# Patient Record
Sex: Female | Born: 1950 | Race: White | Hispanic: No | Marital: Married | Smoking: Former smoker
Health system: Southern US, Community
[De-identification: ages and names within clinical notes are randomized; demographics above are authoritative.]

## PROBLEM LIST (undated history)

## (undated) DIAGNOSIS — I493 Ventricular premature depolarization: Secondary | ICD-10-CM

## (undated) DIAGNOSIS — Q2119 Other specified atrial septal defect: Secondary | ICD-10-CM

## (undated) DIAGNOSIS — F329 Major depressive disorder, single episode, unspecified: Secondary | ICD-10-CM

## (undated) DIAGNOSIS — K5790 Diverticulosis of intestine, part unspecified, without perforation or abscess without bleeding: Secondary | ICD-10-CM

## (undated) DIAGNOSIS — I420 Dilated cardiomyopathy: Secondary | ICD-10-CM

## (undated) DIAGNOSIS — I4892 Unspecified atrial flutter: Secondary | ICD-10-CM

## (undated) DIAGNOSIS — E785 Hyperlipidemia, unspecified: Secondary | ICD-10-CM

## (undated) DIAGNOSIS — R0609 Other forms of dyspnea: Secondary | ICD-10-CM

## (undated) DIAGNOSIS — G4730 Sleep apnea, unspecified: Secondary | ICD-10-CM

## (undated) DIAGNOSIS — H9192 Unspecified hearing loss, left ear: Secondary | ICD-10-CM

## (undated) DIAGNOSIS — Z950 Presence of cardiac pacemaker: Secondary | ICD-10-CM

## (undated) DIAGNOSIS — R001 Bradycardia, unspecified: Secondary | ICD-10-CM

## (undated) DIAGNOSIS — K219 Gastro-esophageal reflux disease without esophagitis: Secondary | ICD-10-CM

## (undated) DIAGNOSIS — I471 Supraventricular tachycardia, unspecified: Secondary | ICD-10-CM

## (undated) DIAGNOSIS — I071 Rheumatic tricuspid insufficiency: Secondary | ICD-10-CM

## (undated) DIAGNOSIS — I10 Essential (primary) hypertension: Secondary | ICD-10-CM

## (undated) DIAGNOSIS — I4719 Other supraventricular tachycardia: Secondary | ICD-10-CM

## (undated) DIAGNOSIS — I5181 Takotsubo syndrome: Secondary | ICD-10-CM

## (undated) HISTORY — PX: ANKLE ARTHROSCOPY WITH REPAIR SUBLUXING TENDON: SHX5584

## (undated) HISTORY — PX: PARTIAL HYSTERECTOMY: SHX80

## (undated) HISTORY — PX: CARDIAC CATHETERIZATION: SHX172

## (undated) HISTORY — PX: REPLACEMENT TOTAL KNEE: SUR1224

## (undated) HISTORY — PX: TOTAL HIP ARTHROPLASTY: SHX124

---

## 2006-02-09 ENCOUNTER — Ambulatory Visit: Payer: Self-pay | Admitting: Internal Medicine

## 2006-12-23 ENCOUNTER — Emergency Department: Payer: Self-pay | Admitting: Unknown Physician Specialty

## 2007-05-22 ENCOUNTER — Emergency Department: Payer: Self-pay | Admitting: Emergency Medicine

## 2007-12-03 ENCOUNTER — Emergency Department: Payer: Self-pay | Admitting: Emergency Medicine

## 2009-06-30 ENCOUNTER — Inpatient Hospital Stay (HOSPITAL_COMMUNITY): Admission: RE | Admit: 2009-06-30 | Discharge: 2009-07-02 | Payer: Self-pay | Admitting: Orthopedic Surgery

## 2009-08-01 ENCOUNTER — Ambulatory Visit: Admission: RE | Admit: 2009-08-01 | Discharge: 2009-08-01 | Payer: Self-pay | Admitting: Orthopedic Surgery

## 2009-08-01 ENCOUNTER — Encounter (INDEPENDENT_AMBULATORY_CARE_PROVIDER_SITE_OTHER): Payer: Self-pay | Admitting: Orthopedic Surgery

## 2009-08-01 ENCOUNTER — Ambulatory Visit: Payer: Self-pay | Admitting: Vascular Surgery

## 2009-08-04 ENCOUNTER — Ambulatory Visit (HOSPITAL_COMMUNITY): Admission: RE | Admit: 2009-08-04 | Discharge: 2009-08-04 | Payer: Self-pay | Admitting: Orthopedic Surgery

## 2009-08-16 DIAGNOSIS — K5732 Diverticulitis of large intestine without perforation or abscess without bleeding: Secondary | ICD-10-CM | POA: Insufficient documentation

## 2009-10-20 ENCOUNTER — Emergency Department: Payer: Self-pay | Admitting: Emergency Medicine

## 2010-04-16 ENCOUNTER — Ambulatory Visit: Payer: Self-pay | Admitting: Unknown Physician Specialty

## 2010-04-21 LAB — PATHOLOGY REPORT

## 2010-09-06 ENCOUNTER — Encounter: Payer: Self-pay | Admitting: Orthopedic Surgery

## 2010-09-07 ENCOUNTER — Encounter: Payer: Self-pay | Admitting: Orthopedic Surgery

## 2010-10-14 ENCOUNTER — Ambulatory Visit: Payer: Self-pay

## 2010-11-18 LAB — BASIC METABOLIC PANEL
BUN: 6 mg/dL (ref 6–23)
CO2: 27 mEq/L (ref 19–32)
CO2: 29 mEq/L (ref 19–32)
Calcium: 8.4 mg/dL (ref 8.4–10.5)
Chloride: 101 mEq/L (ref 96–112)
GFR calc non Af Amer: >60 mL/min (ref >60)
Glucose, Bld: 103 mg/dL — ABNORMAL HIGH (ref 70–99)
Glucose, Bld: 129 mg/dL — ABNORMAL HIGH (ref 70–99)
Potassium: 4.3 mEq/L (ref 3.5–5.1)
Sodium: 135 mEq/L (ref 135–145)
Sodium: 138 mEq/L (ref 135–145)

## 2010-11-18 LAB — URINALYSIS, ROUTINE W REFLEX MICROSCOPIC
Glucose, UA: NEGATIVE mg/dL
Leukocytes, UA: NEGATIVE
Protein, ur: NEGATIVE mg/dL
Specific Gravity, Urine: 1.023 (ref 1.005–1.030)
pH: 5.5 (ref 5.0–8.0)

## 2010-11-18 LAB — COMPREHENSIVE METABOLIC PANEL
ALT: 20 U/L (ref 0–35)
AST: 16 U/L (ref 0–37)
Alkaline Phosphatase: 60 U/L (ref 39–117)
CO2: 26 mEq/L (ref 19–32)
Calcium: 9.1 mg/dL (ref 8.4–10.5)
Potassium: 4.3 mEq/L (ref 3.5–5.1)
Sodium: 138 mEq/L (ref 135–145)
Total Protein: 6.6 g/dL (ref 6.0–8.3)

## 2010-11-18 LAB — CBC
HCT: 33.5 % — ABNORMAL LOW (ref 36.0–46.0)
Hemoglobin: 11.5 g/dL — ABNORMAL LOW (ref 12.0–15.0)
MCHC: 34.5 g/dL (ref 30.0–36.0)
MCHC: 34.8 g/dL (ref 30.0–36.0)
MCHC: 34.7 g/dL (ref 30.0–36.0)
MCV: 89.9 fL (ref 78.0–100.0)
MCV: 90.7 fL (ref 78.0–100.0)
MCV: 89.3 fL (ref 78.0–100.0)
Platelets: 227 10*3/uL (ref 150–400)
RBC: 3.23 MIL/uL — ABNORMAL LOW (ref 3.87–5.11)
RBC: 3.72 MIL/uL — ABNORMAL LOW (ref 3.87–5.11)
RBC: 4.45 MIL/uL (ref 3.87–5.11)
RDW: 13.5 % (ref 11.5–15.5)
WBC: 10 10*3/uL (ref 4.0–10.5)
WBC: 7.9 10*3/uL (ref 4.0–10.5)

## 2010-11-18 LAB — CROSSMATCH: Antibody Screen: NEGATIVE

## 2010-11-18 LAB — DIFFERENTIAL
Eosinophils Absolute: 0.2 10*3/uL (ref 0.0–0.7)
Eosinophils Relative: 2 % (ref 0–5)
Lymphocytes Relative: 33 % (ref 12–46)
Lymphs Abs: 2.6 10*3/uL (ref 0.7–4.0)
Monocytes Absolute: 0.5 10*3/uL (ref 0.1–1.0)
Monocytes Relative: 7 % (ref 3–12)

## 2010-11-18 LAB — URINE MICROSCOPIC-ADD ON

## 2010-11-18 LAB — URINE CULTURE

## 2010-11-18 LAB — ABO/RH: ABO/RH(D): O POS

## 2012-03-09 ENCOUNTER — Emergency Department: Payer: Self-pay | Admitting: Emergency Medicine

## 2012-03-09 LAB — COMPREHENSIVE METABOLIC PANEL
Albumin: 4 g/dL (ref 3.4–5.0)
Alkaline Phosphatase: 73 U/L (ref 50–136)
BUN: 17 mg/dL (ref 7–18)
Bilirubin,Total: 0.6 mg/dL (ref 0.2–1.0)
Creatinine: 0.83 mg/dL (ref 0.60–1.30)
Glucose: 111 mg/dL — ABNORMAL HIGH (ref 65–99)
Osmolality: 276 (ref 275–301)
Sodium: 137 mmol/L (ref 136–145)

## 2012-03-09 LAB — CBC WITH DIFFERENTIAL/PLATELET
Basophil #: 0.1 10*3/uL (ref 0.0–0.1)
Eosinophil #: 0.1 10*3/uL (ref 0.0–0.7)
Lymphocyte %: 16.4 %
MCHC: 34.2 g/dL (ref 32.0–36.0)
Monocyte %: 8.8 %
Neutrophil #: 4.7 10*3/uL (ref 1.4–6.5)
Platelet: 202 10*3/uL (ref 150–440)
RBC: 4.84 10*6/uL (ref 3.80–5.20)
RDW: 13.7 % (ref 11.5–14.5)
WBC: 6.5 10*3/uL (ref 3.6–11.0)

## 2012-03-09 LAB — CK TOTAL AND CKMB (NOT AT ARMC)
CK, Total: 58 U/L (ref 21–215)
CK-MB: 1 ng/mL (ref 0.5–3.6)

## 2012-05-23 ENCOUNTER — Ambulatory Visit: Payer: Self-pay | Admitting: Internal Medicine

## 2012-06-09 ENCOUNTER — Ambulatory Visit: Payer: Self-pay | Admitting: Internal Medicine

## 2012-06-27 ENCOUNTER — Ambulatory Visit: Payer: Self-pay | Admitting: Internal Medicine

## 2014-06-18 ENCOUNTER — Encounter: Payer: Self-pay | Admitting: Internal Medicine

## 2014-07-16 ENCOUNTER — Encounter: Payer: Self-pay | Admitting: Internal Medicine

## 2015-01-29 ENCOUNTER — Encounter: Payer: Self-pay | Admitting: General Practice

## 2015-01-29 ENCOUNTER — Emergency Department
Admission: EM | Admit: 2015-01-29 | Discharge: 2015-01-29 | Disposition: A | Discharge status: Home / Self Care | Payer: Medicare Other | Attending: Emergency Medicine | Admitting: Emergency Medicine

## 2015-01-29 ENCOUNTER — Emergency Department: Payer: Medicare Other

## 2015-01-29 ENCOUNTER — Other Ambulatory Visit: Payer: Self-pay

## 2015-01-29 DIAGNOSIS — Z87891 Personal history of nicotine dependence: Secondary | ICD-10-CM | POA: Diagnosis not present

## 2015-01-29 DIAGNOSIS — Z96651 Presence of right artificial knee joint: Secondary | ICD-10-CM | POA: Insufficient documentation

## 2015-01-29 DIAGNOSIS — Y832 Surgical operation with anastomosis, bypass or graft as the cause of abnormal reaction of the patient, or of later complication, without mention of misadventure at the time of the procedure: Secondary | ICD-10-CM | POA: Insufficient documentation

## 2015-01-29 DIAGNOSIS — R0600 Dyspnea, unspecified: Secondary | ICD-10-CM

## 2015-01-29 DIAGNOSIS — S8012XA Contusion of left lower leg, initial encounter: Secondary | ICD-10-CM

## 2015-01-29 DIAGNOSIS — I10 Essential (primary) hypertension: Secondary | ICD-10-CM | POA: Diagnosis not present

## 2015-01-29 DIAGNOSIS — M96830 Postprocedural hemorrhage and hematoma of a musculoskeletal structure following a musculoskeletal system procedure: Secondary | ICD-10-CM | POA: Diagnosis not present

## 2015-01-29 DIAGNOSIS — R609 Edema, unspecified: Secondary | ICD-10-CM

## 2015-01-29 HISTORY — DX: Gastro-esophageal reflux disease without esophagitis: K21.9

## 2015-01-29 HISTORY — DX: Essential (primary) hypertension: I10

## 2015-01-29 HISTORY — DX: Sleep apnea, unspecified: G47.30

## 2015-01-29 LAB — CBC
HCT: 35.7 % (ref 35.0–47.0)
Hemoglobin: 12.1 g/dL (ref 12.0–16.0)
MCH: 30.3 pg (ref 26.0–34.0)
MCHC: 33.8 g/dL (ref 32.0–36.0)
MCV: 89.6 fL (ref 80.0–100.0)
Platelets: 298 10*3/uL (ref 150–440)
RBC: 3.98 MIL/uL (ref 3.80–5.20)
RDW: 13.2 % (ref 11.5–14.5)
WBC: 10.2 10*3/uL (ref 3.6–11.0)

## 2015-01-29 LAB — COMPREHENSIVE METABOLIC PANEL
ALBUMIN: 3.8 g/dL (ref 3.5–5.0)
ALT: 54 U/L (ref 14–54)
ANION GAP: 12 (ref 5–15)
AST: 41 U/L (ref 15–41)
Alkaline Phosphatase: 114 U/L (ref 38–126)
BILIRUBIN TOTAL: 1 mg/dL (ref 0.3–1.2)
BUN: 21 mg/dL — ABNORMAL HIGH (ref 6–20)
CALCIUM: 8.7 mg/dL — AB (ref 8.9–10.3)
CHLORIDE: 101 mmol/L (ref 101–111)
CO2: 24 mmol/L (ref 22–32)
Creatinine, Ser: 0.84 mg/dL (ref 0.44–1.00)
Glucose, Bld: 143 mg/dL — ABNORMAL HIGH (ref 65–99)
Potassium: 3.5 mmol/L (ref 3.5–5.1)
SODIUM: 137 mmol/L (ref 135–145)
Total Protein: 7.6 g/dL (ref 6.5–8.1)

## 2015-01-29 LAB — TROPONIN I: Troponin I: <0.03 ng/mL (ref <0.031)

## 2015-01-29 LAB — APTT: aPTT: 30 seconds (ref 24–36)

## 2015-01-29 LAB — PROTIME-INR
INR: 1.12
PROTHROMBIN TIME: 14.6 s (ref 11.4–15.0)

## 2015-01-29 MED ORDER — LORAZEPAM 2 MG/ML IJ SOLN
INTRAMUSCULAR | Status: AC
  Filled 2015-01-29: qty 1

## 2015-01-29 MED ORDER — IOHEXOL 350 MG/ML SOLN
100.0000 mL | Freq: Once | INTRAVENOUS | Status: AC | PRN
  Administered 2015-01-29: 100 mL via INTRAVENOUS

## 2015-01-29 MED ORDER — ALPRAZOLAM 0.5 MG PO TABS
0.5000 mg | ORAL_TABLET | Freq: Once | ORAL | Status: AC
  Administered 2015-01-29: 0.5 mg via ORAL

## 2015-01-29 MED ORDER — ALPRAZOLAM 0.5 MG PO TABS
ORAL_TABLET | ORAL | Status: AC
  Administered 2015-01-29: 0.5 mg via ORAL
  Filled 2015-01-29: qty 1

## 2015-01-29 MED ORDER — LORAZEPAM 2 MG/ML IJ SOLN
1.0000 mg | Freq: Once | INTRAMUSCULAR | Status: AC
  Administered 2015-01-29: 1 mg via INTRAVENOUS

## 2015-01-29 NOTE — ED Notes (Signed)
Patient transported to radiology

## 2015-01-29 NOTE — ED Notes (Signed)
Pt. Arrived to ed from home with reports of increase swelling and bruising to LLE. Pt reports having a left knee replacement on Friday. Pt reprots increase swelling and pain to leg over the last two days. Discoloration noted to LLE. Pt alert and oriented. Sent over for evaluation of possible blood clot, per pt.

## 2015-01-29 NOTE — ED Provider Notes (Signed)
[facility] Emergency Department Provider Note ____________________________________________  Time seen: Approximately 5:17 PM  I have reviewed the triage vital signs and the nursing notes.   HISTORY  Chief Complaint Leg Pain and Leg Swelling  HPI Solay Stadtmiller is a 64 y.o. female who is 5 days status post left total knee replacement by Dr. Ananias Pilgrim at [facility] and presents to the emergency department due to left lower extremity pain, swelling, bruising that has been progressing and shortness of breath that began to worsen last night. Patient was started on a Eliquis 2.5 mg twice a day the night of her surgery. She has never had a blood clot in the past. She denies any chest pain, fever, chills, recent illness. Shortness of breath is moderate and is worse when patient is supine or has her legs elevated.  She has had a total knee replacement on the right in 2010 that was revised with a second TKR resulting in limited flexion of her knee requiring her to walk with a cane at baseline. Since having the left TKR, patient uses a walker to transfer and states mobility is difficult.   Past Medical History  Diagnosis Date  . GERD (gastroesophageal reflux disease)   . Hypertension   . Sleep apnea     There are no active problems to display for this patient.   Past Surgical History  Procedure Laterality Date  . Replacement total knee      No current outpatient prescriptions on file.  Allergies Sulfa antibiotics  No family history on file.  Social History History  Substance Use Topics  . Smoking status: Former Games developer  . Smokeless tobacco: Not on file  . Alcohol Use: No    Review of Systems Constitutional: No fever/chills Eyes: No visual changes. ENT: No URI Cardiovascular: Denies chest pain. Respiratory: See History of present illness. Gastrointestinal: No abdominal pain.  No nausea, no vomiting.  No diarrhea.  Musculoskeletal: Negative for back  pain. Skin: Negative for rash. Neurological: Negative for headaches, focal weakness or numbness. Psychiatric:Normal mood Endocrine:No recent weight change 10-point ROS otherwise negative.  ____________________________________________   PHYSICAL EXAM:  VITAL SIGNS: ED Triage Vitals  Enc Vitals Group     BP 01/29/15 1624 159/67 mmHg     Pulse Rate 01/29/15 1624 98     Resp 01/29/15 1700 16     Temp 01/29/15 1624 97.7 F (36.5 C)     Temp Source 01/29/15 1624 Oral     SpO2 01/29/15 1624 95 %     Weight 01/29/15 1624 200 lb (90.719 kg)     Height 01/29/15 1624 5\' 5"  (1.651 m)     Head Cir --      Peak Flow --      Pain Score 01/29/15 1624 7     Pain Loc --      Pain Edu? --      Excl. in GC? --    Constitutional: Alert and oriented. Well appearing; tachypneic Eyes: Conjunctivae are normal. PERRL. EOMI. Head: Atraumatic. Nose: No congestion/rhinnorhea. Mouth/Throat: Mucous membranes are moist.  Oropharynx non-erythematous. Neck: No stridor.   Lymphatic: No cervical lymphadenopathy. Cardiovascular: Normal rate, regular rhythm. Grossly normal heart sounds.  Peripheral pulses 2+ B d.p. & p.t. Respiratory: mild tachypnea, worse with talking.  No retractions. Lungs CTAB. Gastrointestinal: Soft and nontender. No distention. Normal bowel sounds.  Musculoskeletal: Left lower extremity with a clean, dry, intact incision with staples over her anterior knee, ecchymosis over calf, inner thigh, lateral  anterior ankle, 1-2+ pitting edema, pain to palpation of her calf and medial thigh Neurologic:  Normal speech and language. No gross focal neurologic deficits are appreciated. Speech is normal.  Skin:  Skin is warm, dry and intact. No rash noted aside from ecchymosis of her left leg. Psychiatric: Mood and affect are normal. Speech and behavior are normal. She became somewhat anxious when discussing need for CT scan  ____________________________________________   LABS (all labs ordered  are listed, but only abnormal results are displayed)  Labs Reviewed  COMPREHENSIVE METABOLIC PANEL  CBC  TROPONIN I  PROTIME-INR  APTT   ____________________________________________  EKG   Date: 01/29/2015  Rate: 93  Rhythm: normal sinus rhythm with 1 PVC  QRS Axis: normal  Intervals: normal  ST/T Wave abnormalities: normal  Conduction Disutrbances: none  Narrative Interpretation: unremarkable  ____________________________________________  RADIOLOGY  CTA chest-NAD LLE DVT-NAD ____________________________________________   PROCEDURES  Procedure(s) performed: none  Critical Care performed: none ____________________________________________   INITIAL IMPRESSION / ASSESSMENT AND PLAN / ED COURSE  Pertinent labs & imaging results that were available during my care of the patient were reviewed by me and considered in my medical decision making (see chart for details).  Symptoms concerning for DVT and PE. We will evaluate with CTA chest and lower extremity Doppler.  Patient is very anxious about undergoing the CT as she is "extremely claustrophobic". She has taken Xanax in the past but didn't think that it helped. When given Xanax here she did not feel that it was sufficient and requested something IV so I gave her Ativan.  ----------------------------------------- 8:00 PM on 01/29/2015 -----------------------------------------  Patient no longer dyspneic. She feels well and wants to go home. Calf compartment is soft. Patient and spouse advised warning symptoms for compartment syndrome. Also advised to call orthopedics in the morning for possible change in blood thinner prescription. ____________________________________________   FINAL CLINICAL IMPRESSION(S) / ED DIAGNOSES Dyspnea, post-op contusion on Eliquis     Maurilio Lovely, MD 01/29/15 2009

## 2015-01-29 NOTE — ED Notes (Signed)
Per Dr. Rolla Plate she states "patient can take her own medications.  Metoclopramide 5mg  1 tab q6h prn for nausea and oxycodone 5mg  1-2 tab PO q3-4h prn for post op pain.

## 2015-01-29 NOTE — ED Notes (Signed)
Patient reports being unable to proceed with CT scan without IV anxiety medication. Medication given while patient en route to CT.

## 2015-01-29 NOTE — Discharge Instructions (Signed)
Monitor your calf for increased swelling, tightness, numbness or coolness of the foot, increased pain as these could be signs of compartment syndrome. Also return to the emergency department if you develop chest pain, fever or for any other concerns. Talk to your orthopedist tomorrow to find out if they would like you to switch blood thinners.

## 2016-12-28 DIAGNOSIS — F458 Other somatoform disorders: Secondary | ICD-10-CM | POA: Insufficient documentation

## 2018-04-07 DIAGNOSIS — T84012A Broken internal right knee prosthesis, initial encounter: Secondary | ICD-10-CM | POA: Insufficient documentation

## 2018-08-31 ENCOUNTER — Ambulatory Visit: Payer: Medicare Other | Attending: Orthopedic Surgery | Admitting: Physical Therapy

## 2018-08-31 DIAGNOSIS — R269 Unspecified abnormalities of gait and mobility: Secondary | ICD-10-CM

## 2018-08-31 DIAGNOSIS — M6281 Muscle weakness (generalized): Secondary | ICD-10-CM | POA: Insufficient documentation

## 2018-08-31 DIAGNOSIS — M25661 Stiffness of right knee, not elsewhere classified: Secondary | ICD-10-CM

## 2018-08-31 DIAGNOSIS — Z7409 Other reduced mobility: Secondary | ICD-10-CM | POA: Diagnosis present

## 2018-08-31 NOTE — Therapy (Signed)
[facility] [address] [place], [place], [address] Phone: [phone]   Fax:  [phone]  Physical Therapy Evaluation  Patient Details  Name: Joy Vaughan MRN: [id number] Date of Birth: 11/29/1950 Referring Provider (PT): Dr. Silas Flood   Encounter Date: 08/31/2018  PT End of Session - 09/01/18 1040    Visit Number  1    Number of Visits  8    Date for PT Re-Evaluation  09/28/18    PT Start Time  0901    PT Stop Time  1013    PT Time Calculation (min)  72 min    Equipment Utilized During Treatment  Other (comment)   RW   Activity Tolerance  Patient tolerated treatment well    Behavior During Therapy  [facility] for tasks assessed/performed       Past Medical History:  Diagnosis Date  . GERD (gastroesophageal reflux disease)   . Hypertension   . Sleep apnea     Past Surgical History:  Procedure Laterality Date  . REPLACEMENT TOTAL KNEE      There were no vitals filed for this visit.   Subjective Assessment - 08/31/18 0906    Subjective  Pt. ambulates into clinic with stiff antalgic gait and use of RW. Pt. reports having R total knee revison on (04/07/18). See hx for TKA complications and previous PT. Pt. reports being able to drive independently and ambulate stairs safely as well. Pt. reports that she either uses a RW or SPC. Pt. reports tightness along side of thighs that gets worse at night. Pt. reports 0/10 NPS currently and 2/10 pain in joint at worst in the past 7 days. Pt. reports using topical cream with magnesium at night to try to relieve tightness. Pt. enjoys walking around the grocery store with a cart for fun.     Pertinent History  Pt. reports having extensive surgery hx: R ankle tendon repair (2009), R knee arthroscope (2010), R TKA November 2010, R TKA October 2011, R THA 10/04/17. Right TKA revision 04/07/18. Pt. has also had L TKA (June 2016) and L THA (June 2018). Pt. reports that she has had 3 knee replacement  surgeries on the R (2010, 2011, 2019). Pt. reports having her most recent knee surgery (R total knee revison) on (04/07/18) by Dr. Silas Flood. In 2011, the MD (Lang) cemented the components in the tibia, femur, and patella. In her most recent revision, the MD (Dr. Silas Flood) "chiseled behind her patella." She then went to PT at [facility] right after the revision before her appointment with [place] where pt. reports she achieved 40 degreees of knee flexion.     Limitations  Walking;House hold activities;Standing    Diagnostic tests  see imaging     Patient Stated Goals  strengthening LEs and start exercise habit/routine and weight loss     Currently in Pain?  No/denies        Palpation: no mobility noted in patella  ROM: Flex: 113 deg. L, 26 deg. R Ext: -4 deg. L, -1 deg. R  MMT strength: Hip flex 4/5 left, 3+/5 right Hip Abd: 5/5 left, 4/5 right Hip Add: 4+/5 left, 3+ right  Hip IR/ER, 5/5 left, IR/ER 4/5 right  Knee extension 5/5 left, 4/5 with pain right Knee flexion 5/5 left, 5/5 right  DF: 5/5 left, 5/5 right PF: 5/5 left, 5/5 right   Inversion 5/5 left, 4/5 right Eversion 5/5 left, 5/5 right  Objective measurements completed on  examination: See above findings.    Tandem stance in //-bars; 30 sec balance with left leg forward. 35 sec with right leg forward       Reviewed pts. Current HEP  PT Long Term Goals - 08/31/18 1516      PT LONG TERM GOAL #1   Title  Pt. will increase FOTO to 54 to increase pain-free mobility.    Baseline  Initial FOTO: 45    Time  4    Period  Weeks    Status  New    Target Date  09/28/18      PT LONG TERM GOAL #2   Title  Pt. independent with HEP to increase B LE strength by 1/2 grade to improve activity tolerance/ pain-free household mobility.       Baseline  Pt. presents with strength deficits on R (hip flex: 3+/5, hip abd: 4/5, hip add: 3+/5, knee ext: 4/5 with pain). MMT on L is grossly 5/5.     Time  4    Period   Weeks    Status  New    Target Date  09/28/18      PT LONG TERM GOAL #3   Title  Pt. will be able to complete 30 minutes of therapeutic/ aquatic exercise with no increase c/o pain/ limitations to improve functional mobility.      Baseline  pt. is not participating with gym based ex. at this time.  Pt. has a HEP from previous PT clinic.      Time  4    Period  Weeks    Status  New    Target Date  09/28/18      PT LONG TERM GOAL #4   Title  Pt. will progress to use of SPC with consistent gait pattern on all surfaces to promote greater independence in community/ with grandkids.      Baseline  pt. using RW to ambulate outside and occasionally SPC at home.     Time  4    Period  Weeks    Status  New    Target Date  09/28/18         Plan - 08/31/18 [id number]    Clinical Impression Statement  Pt. is a pleasant 68 y/o female with R knee TKA complication/ significant knee joint stiffness.  Pt. reports 0/10 NPS and her worst pain 2/10 NPS. Pt. demonstrates significant R knee flex stiffness with ambulating and use of RW. Pt. has slight forward lean in standing but is able to weight shift equally. Pt. has excellent HS flexibility and significant knee flexion deficits on R (26 deg. R, 113 deg. L); extension (-1 R, -4 L). Pt. presents with strength deficits on R (hip flex: 3+/5, hip abd: 4/5, hip add: 3+/5, knee ext: 4/5 with pain). MMT on L is grossly 5/5. Pt. demonstrated L jt. line circumference of 44.5 cm on left and 47.5 cm on right. L and R mid calf circumference of 40.5 cm. L quad circumference of 52.5 cm and 56.5 for R quad.  Pt. demonstrates step to gait with ascending/descending stairs, slight circumduction of R LE and min UE assist. Pt. will benefit from skilled PT services in order to increase strength and improve pain during ADLs/ overall mobility.     Clinical Presentation  Stable    Clinical Decision Making  Moderate    Rehab Potential  Fair    PT Frequency  2x / week    PT Duration  4  weeks    PT Treatment/Interventions  Aquatic Therapy;Therapeutic activities;Therapeutic exercise;Balance training;Scar mobilization;Passive range of motion;Manual techniques;Neuromuscular re-education;Gait training;Stair training;ADLs/Self Care Home Management;Electrical Stimulation;Moist Heat;Cryotherapy;Functional mobility training;Patient/family education    PT Next Visit Plan  Standing strengthening ex. program.  Pt. has 2 PT visits in clinic prior to starting aquatic ex.      PT Home Exercise Plan  pt. completing prior HEP.        Patient will benefit from skilled therapeutic intervention in order to improve the following deficits and impairments:  Pain, Decreased mobility, Decreased range of motion, Decreased strength, Hypomobility, Difficulty walking, Decreased balance, Decreased coordination, Abnormal gait, Decreased endurance, Decreased activity tolerance, Impaired flexibility  Visit Diagnosis: Joint stiffness of knee, right  Muscle weakness (generalized)  Gait difficulty  Decreased mobility and endurance     Problem List There are no active problems to display for this patient.  Cammie Mcgee, PT, DPT # [address] [place], SPT  [place], [place] 09/01/2018, 12:25 PM  [facility] [facility] [address] [place], [place], [address] Phone: [phone]   Fax:  [phone]  Name: Zendayah Elenes MRN: [id number] Date of Birth: August 22, 1950

## 2018-09-01 ENCOUNTER — Encounter: Payer: Self-pay | Admitting: Physical Therapy

## 2018-09-05 ENCOUNTER — Encounter: Payer: Self-pay | Admitting: Physical Therapy

## 2018-09-05 ENCOUNTER — Ambulatory Visit: Payer: Medicare Other | Admitting: Physical Therapy

## 2018-09-05 DIAGNOSIS — M25661 Stiffness of right knee, not elsewhere classified: Secondary | ICD-10-CM | POA: Diagnosis not present

## 2018-09-05 DIAGNOSIS — M6281 Muscle weakness (generalized): Secondary | ICD-10-CM

## 2018-09-05 DIAGNOSIS — Z7409 Other reduced mobility: Secondary | ICD-10-CM

## 2018-09-05 DIAGNOSIS — R269 Unspecified abnormalities of gait and mobility: Secondary | ICD-10-CM

## 2018-09-05 NOTE — Therapy (Signed)
[facility] [address] [place], [place], [address] Phone: [phone]   Fax:  [phone]  Physical Therapy Treatment  Patient Details  Name: Joy Vaughan MRN: [id number] Date of Birth: [birth year] Referring Provider (PT): Dr. Silas Flood   Encounter Date: 09/05/2018  PT End of Session - 09/05/18 0855    Visit Number  2    Number of Visits  8    Date for PT Re-Evaluation  09/28/18    PT Start Time  0848    PT Stop Time  0941    PT Time Calculation (min)  53 min    Equipment Utilized During Treatment  Other (comment)    Activity Tolerance  Patient tolerated treatment well    Behavior During Therapy  [facility] for tasks assessed/performed       Past Medical History:  Diagnosis Date  . GERD (gastroesophageal reflux disease)   . Hypertension   . Sleep apnea     Past Surgical History:  Procedure Laterality Date  . REPLACEMENT TOTAL KNEE      There were no vitals filed for this visit.  Subjective Assessment - 09/05/18 0853    Subjective  Pt. reports no increase in sx since initial eval last week. Pt. reports pain with flexion but no pain at rest. Pt. states that she starts aquatic therapy next week.    Pertinent History  Pt. reports having extensive surgery hx: R ankle tendon repair (2009), R knee arthroscope (2010), R TKA November 2010, R TKA October 2011, R THA 10/04/17. Right TKA revision 04/07/18. Pt. has also had L TKA (June 2016) and L THA (June 2018). Pt. reports that she has had 3 knee replacement surgeries on the R (2010, 2011, 2019). Pt. reports having her most recent knee surgery (R total knee revison) on (04/07/18) by Dr. Silas Flood. In 2011, the MD (Lang) cemented the components in the tibia, femur, and patella. In her most recent revision, the MD (Dr. Silas Flood) "chiseled behind her patella." She then went to PT at [facility] right after the revision before her appointment with [place] where pt. reports she  achieved 40 degreees of knee flexion.     Limitations  Walking;House hold activities;Standing    Diagnostic tests  see imaging     Patient Stated Goals  strengthening LEs and start exercise habit/routine and weight loss     Currently in Pain?  No/denies    Multiple Pain Sites  No       Treatment:  Therex tx:  NuStep L3 (warm up) 10 min L3 seat at 11 Forward/side steping in //-bars 4x (PT min verbal cueing for proper foot placement and heel strike) (pt. Reported slight pain in low back)   2.5# standing abduction and extension 2x12 Hip hikes/drops on step 2x12 B Bilat standing hip abduction and extension 2x12 with 3# ankle wt.  Bilat standing hip hike on elevated platform with 3# ankle wt.  TG squats with 3 sec hold in extension 2x12 with ball squeeze, calf raises 2x12 TG SL squats (L) 2x12  Neuro: B SLB in //-bars (cues for one hand UE light assist only) Gait training in gym and hallway with SPC (mild antalgic gait). (Min. CGA, PT focus on proper use of cane in L UE)     PT Long Term Goals - 08/31/18 1516      PT LONG TERM GOAL #1   Title  Pt. will increase FOTO to 54 to increase pain-free mobility.  Baseline  Initial FOTO: 45    Time  4    Period  Weeks    Status  New    Target Date  09/28/18      PT LONG TERM GOAL #2   Title  Pt. independent with HEP to increase B LE strength by 1/2 grade to improve activity tolerance/ pain-free household mobility.       Baseline  Pt. presents with strength deficits on R (hip flex: 3+/5, hip abd: 4/5, hip add: 3+/5, knee ext: 4/5 with pain). MMT on L is grossly 5/5.     Time  4    Period  Weeks    Status  New    Target Date  09/28/18      PT LONG TERM GOAL #3   Title  Pt. will be able to complete 30 minutes of therapeutic/ aquatic exercise with no increase c/o pain/ limitations to improve functional mobility.      Baseline  pt. is not participating with gym based ex. at this time.  Pt. has a HEP from previous PT clinic.      Time  4     Period  Weeks    Status  New    Target Date  09/28/18      PT LONG TERM GOAL #4   Title  Pt. will progress to use of SPC with consistent gait pattern on all surfaces to promote greater independence in community/ with grandkids.      Baseline  pt. using RW to ambulate outside and occasionally SPC at home.     Time  4    Period  Weeks    Status  New    Target Date  09/28/18       Patient will benefit from skilled therapeutic intervention in order to improve the following deficits and impairments:  Pain, Decreased mobility, Decreased range of motion, Decreased strength, Hypomobility, Difficulty walking, Decreased balance, Decreased coordination, Abnormal gait, Decreased endurance, Decreased activity tolerance, Impaired flexibility  Visit Diagnosis: Joint stiffness of knee, right  Muscle weakness (generalized)  Gait difficulty  Decreased mobility and endurance     Problem List There are no active problems to display for this patient.  Cammie McgeeMichael C Sherk, PT, DPT # [id number]   [place], SPT 09/05/2018, 3:54 PM  [facility] [address] [place], [place], [address] Phone: [phone]   Fax:  [phone]  Name: Joy StackLinda Vaughan MRN: [id number] Date of Birth: 08/28/1950

## 2018-09-12 ENCOUNTER — Ambulatory Visit: Payer: Medicare Other | Admitting: Physical Therapy

## 2018-09-14 ENCOUNTER — Ambulatory Visit: Payer: Medicare Other

## 2018-09-14 DIAGNOSIS — R269 Unspecified abnormalities of gait and mobility: Secondary | ICD-10-CM

## 2018-09-14 DIAGNOSIS — Z7409 Other reduced mobility: Secondary | ICD-10-CM

## 2018-09-14 DIAGNOSIS — M25661 Stiffness of right knee, not elsewhere classified: Secondary | ICD-10-CM | POA: Diagnosis not present

## 2018-09-14 DIAGNOSIS — M6281 Muscle weakness (generalized): Secondary | ICD-10-CM

## 2018-09-14 NOTE — Therapy (Signed)
[facility] [address] [place], [place], [address] Phone: [phone]   Fax:  [phone]  Physical Therapy Treatment  Patient Details  Name: Joy StackLinda Vaughan MRN: [id number] Date of Birth: 09/18/1950 Referring Provider (PT): Dr. Silas FloodBradley Vaughn   Encounter Date: 09/14/2018  PT End of Session - 09/14/18 0941    Visit Number  3    Number of Visits  8    Date for PT Re-Evaluation  09/28/18    PT Start Time  0800    PT Stop Time  0845    PT Time Calculation (min)  45 min    Activity Tolerance  Patient tolerated treatment well    Behavior During Therapy  [facility] for tasks assessed/performed       Past Medical History:  Diagnosis Date  . GERD (gastroesophageal reflux disease)   . Hypertension   . Sleep apnea     Past Surgical History:  Procedure Laterality Date  . REPLACEMENT TOTAL KNEE      There were no vitals filed for this visit.  Subjective Assessment - 09/14/18 0934    Subjective  Pt reports some pain about R knee cap from "trying to get it to move". Pt notes pain in R knee as mild at rest and increases to mod with therapy and at home attempts to bend R knee.     Pertinent History  Pt. reports having extensive surgery hx: R ankle tendon repair (2009), R knee arthroscope (2010), R TKA November 2010, R TKA October 2011, R THA 10/04/17. Right TKA revision 04/07/18. Pt. has also had L TKA (June 2016) and L THA (June 2018). Pt. reports that she has had 3 knee replacement surgeries on the R (2010, 2011, 2019). Pt. reports having her most recent knee surgery (R total knee revison) on (04/07/18) by Dr. Silas FloodBradley Vaughn. In 2011, the MD (Lang) cemented the components in the tibia, femur, and patella. In her most recent revision, the MD (Dr. Silas FloodBradley Vaughn) "chiseled behind her patella." [facility] right after the revision before her appointment with us where pt. reports she achieved 40 degreees of knee  flexion.       Warm up ambulation, blue dumbbells for support  Fwd 6 L  Side 4 L  Rail  Squats with hold in low position, 30x  Bench  Bicycle, 1 min (attempted AAROM R to assist flexion, but unsuccessful)   Patellar mobs (feel slight med'l/lat'l movement), 2 x several minutes  Passive flexion (hinged over therapist knee); also with 5# ankle weight   Fwd slow march ambulation with R 5# ankle wt (avoid buoyancy distal LE elevation); Static R knee march with 5# ankle weight (avoid buoyancy distal LE elevation) with manual assist to promote flexion  RLE on step with fwd lunge stretching  Independent ambulation x 5 min with blue dumbbells to relax L hip.                          PT Education - 09/14/18 0936    Education Details  Propeties and benefits of water as it pertains to exercise; specifically using buoyancy to support as movement slowed for walking, massaging effects of water, and resistive forces.  Squats, stretching using step, weighted ankle.     Person(s) Educated  Patient    Methods  Explanation;Demonstration;Verbal cues;Tactile cues    Comprehension  Verbalized understanding;Verbal cues required;Tactile cues required  PT Long Term Goals - 08/31/18 1516      PT LONG TERM GOAL #1   Title  Pt. will increase FOTO to 54 to increase pain-free mobility.    Baseline  Initial FOTO: 45    Time  4    Period  Weeks    Status  New    Target Date  09/28/18      PT LONG TERM GOAL #2   Title  Pt. independent with HEP to increase B LE strength by 1/2 grade to improve activity tolerance/ pain-free household mobility.       Baseline  Pt. presents with strength deficits on R (hip flex: 3+/5, hip abd: 4/5, hip add: 3+/5, knee ext: 4/5 with pain). MMT on L is grossly 5/5.     Time  4    Period  Weeks    Status  New    Target Date  09/28/18      PT LONG TERM GOAL #3   Title  Pt. will be able to complete 30 minutes of therapeutic/ aquatic exercise  with no increase c/o pain/ limitations to improve functional mobility.      Baseline  pt. is not participating with gym based ex. at this time.  Pt. has a HEP from previous PT clinic.      Time  4    Period  Weeks    Status  New    Target Date  09/28/18      PT LONG TERM GOAL #4   Title  Pt. will progress to use of SPC with consistent gait pattern on all surfaces to promote greater independence in community/ with grandkids.      Baseline  pt. using RW to ambulate outside and occasionally SPC at home.     Time  4    Period  Weeks    Status  New    Target Date  09/28/18            Plan - 09/14/18 0941    Clinical Impression Statement  Pt tolerated initial aquatic session well. Pt extremely limited in R knee flexion demonstrating little to no bend with ambulation on land and in water. Extensive education and cueing to avoid contraction of quadriceps while advancing RLE through water as well as slowing pace to facilitate process. Worked on R knee flexion with ambulation in march form with and without ankle weight, with squat, lunge stretch at step and AAROM seated on bench with ankle weight as well as patellar mobs. Pt did achieve greater motion from initial ability at start of session. Will obtain a goniometer for next session to allow for pre and post measurement. Pt immediately notes less discomfort in R knee with supported body weight in water. Stretching activities create tolerable discomfort. Post session on land, pt reports feeling looser through R knee and baseline discomfort. Continue efforts to increased R knee flexion in aquatic environment.     Rehab Potential  Fair    PT Frequency  2x / week    PT Duration  4 weeks    PT Treatment/Interventions  Aquatic Therapy;Therapeutic activities;Therapeutic exercise;Balance training;Scar mobilization;Passive range of motion;Manual techniques;Neuromuscular re-education;Gait training;Stair training;ADLs/Self Care Home Management;Electrical  Stimulation;Moist Heat;Cryotherapy;Functional mobility training;Patient/family education    PT Next Visit Plan  education on aquatic therapy (get there early, swimsuit, benefits, etc.)    PT Home Exercise Plan  pt. completing prior HEP - bed exercises       Patient will benefit from skilled therapeutic intervention  in order to improve the following deficits and impairments:  Pain, Decreased mobility, Decreased range of motion, Decreased strength, Hypomobility, Difficulty walking, Decreased balance, Decreased coordination, Abnormal gait, Decreased endurance, Decreased activity tolerance, Impaired flexibility  Visit Diagnosis: Joint stiffness of knee, right  Muscle weakness (generalized)  Gait difficulty  Decreased mobility and endurance     Problem List There are no active problems to display for this patient.   Scot DockHeidi E  09/14/2018, 9:50 AM  [place] [facility] [address] [place], [place], [address] Phone: [phone]   Fax:  [phone]  Name: Joy StackLinda Frenette MRN: [id number] Date of Birth: 12/26/1950

## 2018-09-19 ENCOUNTER — Other Ambulatory Visit: Payer: Self-pay

## 2018-09-19 ENCOUNTER — Ambulatory Visit: Payer: Medicare Other | Attending: Orthopedic Surgery

## 2018-09-19 DIAGNOSIS — M25661 Stiffness of right knee, not elsewhere classified: Secondary | ICD-10-CM

## 2018-09-19 DIAGNOSIS — Z7409 Other reduced mobility: Secondary | ICD-10-CM | POA: Insufficient documentation

## 2018-09-19 DIAGNOSIS — M6281 Muscle weakness (generalized): Secondary | ICD-10-CM | POA: Diagnosis present

## 2018-09-19 DIAGNOSIS — R269 Unspecified abnormalities of gait and mobility: Secondary | ICD-10-CM | POA: Insufficient documentation

## 2018-09-19 NOTE — Therapy (Signed)
[place] [facility] [address] [place], [place], [address] Phone: [phone]   Fax:  [phone]  Physical Therapy Treatment  Patient Details  Name: Joy StackLinda Vaughan MRN: [id number] Date of Birth: 02/18/1951 Referring Provider (PT): Dr. Silas FloodBradley Vaughn   Encounter Date: 09/19/2018  PT End of Session - 09/19/18 1749    Visit Number  4    Number of Visits  8    Date for PT Re-Evaluation  09/28/18    PT Start Time  1030    PT Stop Time  1115    PT Time Calculation (min)  45 min    Activity Tolerance  Patient tolerated treatment well    Behavior During Therapy  [facility] for tasks assessed/performed       Past Medical History:  Diagnosis Date  . GERD (gastroesophageal reflux disease)   . Hypertension   . Sleep apnea     Past Surgical History:  Procedure Laterality Date  . REPLACEMENT TOTAL KNEE      There were no vitals filed for this visit.  Subjective Assessment - 09/19/18 1746    Subjective  Pt reports feeling moderately more sore for several days post last aquatic session and is mildly so currently.     Pertinent History  Pt. reports having extensive surgery hx: R ankle tendon repair (2009), R knee arthroscope (2010), R TKA November 2010, R TKA October 2011, R THA 10/04/17. Right TKA revision 04/07/18. Pt. has also had L TKA (June 2016) and L THA (June 2018). Pt. reports that she has had 3 knee replacement surgeries on the R (2010, 2011, 2019). Pt. reports having her most recent knee surgery (R total knee revison) on (04/07/18) by Dr. Silas FloodBradley Vaughn. In 2011, the MD (Lang) cemented the components in the tibia, femur, and patella. In her most recent revision, the MD (Dr. Silas FloodBradley Vaughn) "chiseled behind her patella." [facility] right after the revision before her appointment with us where pt. reports she achieved 40 degreees of knee flexion.       Ambulation; blue dumbbells, attention to R swing phase with  heel to butt and lead with knee.   Fwd 6  Side 4  Rail, chest deep, 30x ea  Squats with hold  R knee march  R side lunge with hold  Bench, 12 min  AAROM R knee flexion with overpressure and hold  Patellar mobs; minimal movement  R forward lunge at step with hold, 20x  Ambulation training walking up ramp for knee flexion and swing advancement leading with knee. (versus stiff legged ambulation) Transfer training to engage RLE more                           PT Education - 09/19/18 1747    Education Details  Concept of working to gain range without increasing pain too much. Explained a day maybe 2 of mild preferable over subjective report today.    Person(s) Educated  Patient    Methods  Explanation    Comprehension  Verbalized understanding          PT Long Term Goals - 08/31/18 1516      PT LONG TERM GOAL #1   Title  Pt. will increase FOTO to 54 to increase pain-free mobility.    Baseline  Initial FOTO: 45    Time  4    Period  Weeks    Status  New    Target Date  09/28/18      PT LONG TERM GOAL #2   Title  Pt. independent with HEP to increase B LE strength by 1/2 grade to improve activity tolerance/ pain-free household mobility.       Baseline  Pt. presents with strength deficits on R (hip flex: 3+/5, hip abd: 4/5, hip add: 3+/5, knee ext: 4/5 with pain). MMT on L is grossly 5/5.     Time  4    Period  Weeks    Status  New    Target Date  09/28/18      PT LONG TERM GOAL #3   Title  Pt. will be able to complete 30 minutes of therapeutic/ aquatic exercise with no increase c/o pain/ limitations to improve functional mobility.      Baseline  pt. is not participating with gym based ex. at this time.  Pt. has a HEP from previous PT clinic.      Time  4    Period  Weeks    Status  New    Target Date  09/28/18      PT LONG TERM GOAL #4   Title  Pt. will progress to use of SPC with consistent gait pattern on all surfaces to promote greater  independence in community/ with grandkids.      Baseline  pt. using RW to ambulate outside and occasionally SPC at home.     Time  4    Period  Weeks    Status  New    Target Date  09/28/18            Plan - 09/19/18 1749    Clinical Impression Statement  Performed session today without using ankle wt to hopefully deceased post session soreness. Enouraged pain management with ice. Also encouraged attention to use of knee with function, as pt is observed maintaining knee in ext. Educated on walking and transfer use. Range improved from 9 pre session to 20 post session       Patient will benefit from skilled therapeutic intervention in order to improve the following deficits and impairments:     Visit Diagnosis: Joint stiffness of knee, right  Muscle weakness (generalized)  Gait difficulty  Decreased mobility and endurance     Problem List There are no active problems to display for this patient.   Scot Dock 09/19/2018, 5:52 PM  [facility] [facility] MAIN [facility] SERVICES [address] [place], [place], [address] Phone: [phone]   Fax:  [phone]  Name: Joy Vaughan MRN: [id number] Date of Birth: [birth year]

## 2018-09-21 ENCOUNTER — Other Ambulatory Visit: Payer: Self-pay

## 2018-09-21 ENCOUNTER — Ambulatory Visit: Payer: Medicare Other

## 2018-09-21 DIAGNOSIS — M25661 Stiffness of right knee, not elsewhere classified: Secondary | ICD-10-CM

## 2018-09-21 DIAGNOSIS — M6281 Muscle weakness (generalized): Secondary | ICD-10-CM

## 2018-09-21 DIAGNOSIS — R269 Unspecified abnormalities of gait and mobility: Secondary | ICD-10-CM

## 2018-09-21 DIAGNOSIS — Z7409 Other reduced mobility: Secondary | ICD-10-CM

## 2018-09-21 NOTE — Therapy (Signed)
[place] [facility] MAIN [facility] SERVICES [address] [place], [place], [address] Phone: [phone]   Fax:  [phone]  Physical Therapy Treatment  Patient Details  Name: Joy Vaughan MRN: [id number] Date of Birth: 06-24-1951 Referring Provider (PT): Dr. Silas Flood   Encounter Date: 09/21/2018  PT End of Session - 09/21/18 1222    Visit Number  5    Number of Visits  8    Date for PT Re-Evaluation  09/28/18    PT Start Time  1100    PT Stop Time  1205    PT Time Calculation (min)  65 min    Activity Tolerance  Patient tolerated treatment well    Behavior During Therapy  [facility] for tasks assessed/performed       Past Medical History:  Diagnosis Date  . GERD (gastroesophageal reflux disease)   . Hypertension   . Sleep apnea     Past Surgical History:  Procedure Laterality Date  . REPLACEMENT TOTAL KNEE      There were no vitals filed for this visit.  Subjective Assessment - 09/21/18 1218    Subjective  Pt reports tolerating last aquatic session well without increased pain in R knee. Pt feels knee is moving a little better overall.     Pertinent History  Pt. reports having extensive surgery hx: R ankle tendon repair (2009), R knee arthroscope (2010), R TKA November 2010, R TKA October 2011, R THA 10/04/17. Right TKA revision 04/07/18. Pt. has also had L TKA (June 2016) and L THA (June 2018). Pt. reports that she has had 3 knee replacement surgeries on the R (2010, 2011, 2019). Pt. reports having her most recent knee surgery (R total knee revison) on (04/07/18) by Dr. Silas Flood. In 2011, the MD (Lang) cemented the components in the tibia, femur, and patella. In her most recent revision, the MD (Dr. Silas Flood) "chiseled behind her patella." She then [facility] right after the revision before her appointment with [place] where pt. reports she achieved 40 degreees of knee flexion.       Enters exits via ramp  Warm up  ambulation, blue dumbbells  Fwd 4 L  Side with squat 4 L  Step, chest deep prior to on 6" step  Eccentric R knee flexion with hold in flexion, 30x  Squats with hold, 30x  R side lunge with hold, 30x  Bench  AAROM R knee flexion with overpressure   AAROM R knee flexion with manual techniques through R quad and tendon  R knee fwd lunge flexion on step (rib cage deep)  6 L walking focus on R heel to butt followed by knee through first swing phase  Eccentric R knee flexion from step as above                          PT Education - 09/21/18 1219    Education Details  Continued education on focusing on using RLE functionally with as much bend as she has and attempting to progress. Education regarding what is done or achieved in therapy can be undone in the remaining 23 hours of the day if pt is not working to avoid compensatory movements and keeping L knee in an extension hold.     Person(s) Educated  Patient    Methods  Explanation    Comprehension  Verbalized understanding          PT Long  Term Goals - 08/31/18 1516      PT LONG TERM GOAL #1   Title  Pt. will increase FOTO to 54 to increase pain-free mobility.    Baseline  Initial FOTO: 45    Time  4    Period  Weeks    Status  New    Target Date  09/28/18      PT LONG TERM GOAL #2   Title  Pt. independent with HEP to increase B LE strength by 1/2 grade to improve activity tolerance/ pain-free household mobility.       Baseline  Pt. presents with strength deficits on R (hip flex: 3+/5, hip abd: 4/5, hip add: 3+/5, knee ext: 4/5 with pain). MMT on L is grossly 5/5.     Time  4    Period  Weeks    Status  New    Target Date  09/28/18      PT LONG TERM GOAL #3   Title  Pt. will be able to complete 30 minutes of therapeutic/ aquatic exercise with no increase c/o pain/ limitations to improve functional mobility.      Baseline  pt. is not participating with gym based ex. at this time.  Pt. has a HEP  from previous PT clinic.      Time  4    Period  Weeks    Status  New    Target Date  09/28/18      PT LONG TERM GOAL #4   Title  Pt. will progress to use of SPC with consistent gait pattern on all surfaces to promote greater independence in community/ with grandkids.      Baseline  pt. using RW to ambulate outside and occasionally SPC at home.     Time  4    Period  Weeks    Status  New    Target Date  09/28/18            Plan - 09/21/18 1222    Clinical Impression Statement  Continued focus on ambulation using R heel kick toward buttock followed by knee through first on swing phase. Added eccentric R knee flexion off step in supported water level (chest deep prior to up on step 6" step). Added manual techniques (muscle plan, light TFM quad tendon, active diagonal cross fiber with active R knee flexion) seated at bench. Pt quite tender through quad tendon and quadriceps. Re educated on use of foam roller (or tennis ball/massage ball) use to manipulate/massage muscular restrictions. Also re educated on use of ice. Pt visibly flexing knee more in water with squat/side lunge and amulation activities. Requires increased cueing to carry over to land; reiterated again the need to focus on functional R knee flexion with land based ambulation and STS transfers. Continue to progress range of R knee.        Patient will benefit from skilled therapeutic intervention in order to improve the following deficits and impairments:     Visit Diagnosis: Joint stiffness of knee, right  Muscle weakness (generalized)  Gait difficulty  Decreased mobility and endurance     Problem List There are no active problems to display for this patient.   Scot Dock 09/21/2018, 12:33 PM  [place] [facility] MAIN [facility] SERVICES [address] [place], [place], [address] Phone: [phone]   Fax:  [phone]  Name: Joy Vaughan MRN: [id number] Date of Birth:  January 19, 1951

## 2018-09-26 ENCOUNTER — Inpatient Hospital Stay
Admission: EM | Admit: 2018-09-26 | Discharge: 2018-09-30 | DRG: 372 | Disposition: A | Discharge status: Home / Self Care | Payer: Medicare Other | Attending: Internal Medicine | Admitting: Internal Medicine

## 2018-09-26 ENCOUNTER — Ambulatory Visit: Payer: Medicare Other

## 2018-09-26 ENCOUNTER — Emergency Department: Payer: Medicare Other

## 2018-09-26 ENCOUNTER — Other Ambulatory Visit: Payer: Self-pay

## 2018-09-26 ENCOUNTER — Encounter: Payer: Self-pay | Admitting: *Deleted

## 2018-09-26 DIAGNOSIS — G4730 Sleep apnea, unspecified: Secondary | ICD-10-CM | POA: Diagnosis present

## 2018-09-26 DIAGNOSIS — F329 Major depressive disorder, single episode, unspecified: Secondary | ICD-10-CM | POA: Diagnosis present

## 2018-09-26 DIAGNOSIS — K219 Gastro-esophageal reflux disease without esophagitis: Secondary | ICD-10-CM | POA: Diagnosis present

## 2018-09-26 DIAGNOSIS — F40240 Claustrophobia: Secondary | ICD-10-CM | POA: Diagnosis present

## 2018-09-26 DIAGNOSIS — E876 Hypokalemia: Secondary | ICD-10-CM | POA: Diagnosis present

## 2018-09-26 DIAGNOSIS — A0472 Enterocolitis due to Clostridium difficile, not specified as recurrent: Principal | ICD-10-CM | POA: Diagnosis present

## 2018-09-26 DIAGNOSIS — Z87891 Personal history of nicotine dependence: Secondary | ICD-10-CM | POA: Diagnosis not present

## 2018-09-26 DIAGNOSIS — I10 Essential (primary) hypertension: Secondary | ICD-10-CM | POA: Diagnosis present

## 2018-09-26 DIAGNOSIS — Z79899 Other long term (current) drug therapy: Secondary | ICD-10-CM | POA: Diagnosis not present

## 2018-09-26 DIAGNOSIS — K5732 Diverticulitis of large intestine without perforation or abscess without bleeding: Secondary | ICD-10-CM | POA: Diagnosis present

## 2018-09-26 DIAGNOSIS — Z7902 Long term (current) use of antithrombotics/antiplatelets: Secondary | ICD-10-CM | POA: Diagnosis not present

## 2018-09-26 DIAGNOSIS — Z881 Allergy status to other antibiotic agents status: Secondary | ICD-10-CM

## 2018-09-26 DIAGNOSIS — K5792 Diverticulitis of intestine, part unspecified, without perforation or abscess without bleeding: Secondary | ICD-10-CM

## 2018-09-26 HISTORY — DX: Diverticulosis of intestine, part unspecified, without perforation or abscess without bleeding: K57.90

## 2018-09-26 HISTORY — DX: Diverticulitis of intestine, part unspecified, without perforation or abscess without bleeding: K57.92

## 2018-09-26 LAB — URINALYSIS, COMPLETE (UACMP) WITH MICROSCOPIC
Bacteria, UA: NONE SEEN
Bilirubin Urine: NEGATIVE
Glucose, UA: NEGATIVE mg/dL
Ketones, ur: NEGATIVE mg/dL
Leukocytes,Ua: NEGATIVE
Nitrite: NEGATIVE
Protein, ur: NEGATIVE mg/dL
Specific Gravity, Urine: >1.046 — ABNORMAL HIGH (ref 1.005–1.030)
pH: 6 (ref 5.0–8.0)

## 2018-09-26 LAB — COMPREHENSIVE METABOLIC PANEL
ALK PHOS: 64 U/L (ref 38–126)
ALT: 70 U/L — ABNORMAL HIGH (ref 0–44)
AST: 51 U/L — AB (ref 15–41)
Albumin: 4.2 g/dL (ref 3.5–5.0)
Anion gap: 10 (ref 5–15)
BUN: 16 mg/dL (ref 8–23)
CALCIUM: 8.9 mg/dL (ref 8.9–10.3)
CO2: 23 mmol/L (ref 22–32)
CREATININE: 1.03 mg/dL — AB (ref 0.44–1.00)
Chloride: 103 mmol/L (ref 98–111)
GFR calc non Af Amer: 56 mL/min — ABNORMAL LOW (ref >60)
GLUCOSE: 134 mg/dL — AB (ref 70–99)
Potassium: 3.4 mmol/L — ABNORMAL LOW (ref 3.5–5.1)
SODIUM: 136 mmol/L (ref 135–145)
Total Bilirubin: 1.1 mg/dL (ref 0.3–1.2)
Total Protein: 7.6 g/dL (ref 6.5–8.1)

## 2018-09-26 LAB — CBC
HCT: 41.3 % (ref 36.0–46.0)
Hemoglobin: 13.8 g/dL (ref 12.0–15.0)
MCH: 29.4 pg (ref 26.0–34.0)
MCHC: 33.4 g/dL (ref 30.0–36.0)
MCV: 88.1 fL (ref 80.0–100.0)
NRBC: 0 % (ref 0.0–0.2)
Platelets: 271 10*3/uL (ref 150–400)
RBC: 4.69 MIL/uL (ref 3.87–5.11)
RDW: 13.3 % (ref 11.5–15.5)
WBC: 15 10*3/uL — ABNORMAL HIGH (ref 4.0–10.5)

## 2018-09-26 LAB — LIPASE, BLOOD: Lipase: 24 U/L (ref 11–51)

## 2018-09-26 MED ORDER — PIPERACILLIN-TAZOBACTAM 3.375 G IVPB 30 MIN
3.3750 g | Freq: Once | INTRAVENOUS | Status: AC
  Administered 2018-09-26: 3.375 g via INTRAVENOUS
  Filled 2018-09-26: qty 50

## 2018-09-26 MED ORDER — LORAZEPAM 2 MG/ML IJ SOLN
INTRAMUSCULAR | Status: AC
  Filled 2018-09-26: qty 1

## 2018-09-26 MED ORDER — IOHEXOL 300 MG/ML  SOLN
30.0000 mL | Freq: Once | INTRAMUSCULAR | Status: AC | PRN
  Administered 2018-09-26: 30 mL via ORAL
  Filled 2018-09-26: qty 30

## 2018-09-26 MED ORDER — LORAZEPAM 2 MG/ML IJ SOLN
1.0000 mg | Freq: Once | INTRAMUSCULAR | Status: AC
  Administered 2018-09-26: 1 mg via INTRAVENOUS

## 2018-09-26 MED ORDER — MORPHINE SULFATE (PF) 4 MG/ML IV SOLN
4.0000 mg | Freq: Once | INTRAVENOUS | Status: AC
  Administered 2018-09-26: 4 mg via INTRAVENOUS
  Filled 2018-09-26: qty 1

## 2018-09-26 MED ORDER — ONDANSETRON HCL 4 MG/2ML IJ SOLN
4.0000 mg | Freq: Once | INTRAMUSCULAR | Status: AC
  Administered 2018-09-26: 4 mg via INTRAVENOUS
  Filled 2018-09-26: qty 2

## 2018-09-26 MED ORDER — IOHEXOL 300 MG/ML  SOLN
100.0000 mL | Freq: Once | INTRAMUSCULAR | Status: AC | PRN
  Administered 2018-09-26: 100 mL via INTRAVENOUS
  Filled 2018-09-26: qty 100

## 2018-09-26 MED ORDER — SODIUM CHLORIDE 0.9 % IV SOLN
1000.0000 mL | Freq: Once | INTRAVENOUS | Status: AC
  Administered 2018-09-26: 1000 mL via INTRAVENOUS

## 2018-09-26 NOTE — ED Notes (Signed)
Admitting physician in with patient.  

## 2018-09-26 NOTE — H&P (Signed)
[facility] - [place] at [facility] [facility]   PATIENT NAME: Joy Vaughan    MR#:  [id number]  DATE OF BIRTH:  1950-12-05  DATE OF ADMISSION:  09/26/2018  PRIMARY CARE PHYSICIAN: Marisue Ivan, MD   REQUESTING/REFERRING PHYSICIAN: Cyril Loosen, MD  CHIEF COMPLAINT:   Chief Complaint  Patient presents with  . Abdominal Pain    HISTORY OF PRESENT ILLNESS:  Joy Vaughan  is a 68 y.o. female who presents with chief complaint as above.  Patient presents with a complaint of 3 days of left lower quadrant pain.  She states that this is been getting progressively worse.  She states is been difficult for her to sleep for the last 2 nights due to the pain.  Here in the ED today imaging reveals sigmoid diverticulitis.  She has an elevated white blood cell count of 15.  Hospitalist were called for admission  PAST MEDICAL HISTORY:   Past Medical History:  Diagnosis Date  . Diverticulosis   . GERD (gastroesophageal reflux disease)   . Hypertension   . Sleep apnea      PAST SURGICAL HISTORY:   Past Surgical History:  Procedure Laterality Date  . REPLACEMENT TOTAL KNEE       SOCIAL HISTORY:   Social History   Tobacco Use  . Smoking status: Former Games developer  . Smokeless tobacco: Never Used  Substance Use Topics  . Alcohol use: No     FAMILY HISTORY:    Family history reviewed and is non-contributory DRUG ALLERGIES:   Allergies  Allergen Reactions  . Sulfa Antibiotics Itching and Rash    MEDICATIONS AT HOME:   Prior to Admission medications   Medication Sig Start Date End Date Taking? Authorizing Provider  apixaban (ELIQUIS) 2.5 MG TABS tablet Take 2.5 mg by mouth 2 (two) times daily.    [provider]  Calcium Carbonate-Vitamin D (CALCIUM + D PO) Take 1 tablet by mouth daily.    [provider]  diltiazem (CARDIZEM CD) 120 MG 24 hr capsule Take 120 mg by mouth daily.    [provider]  losartan-hydrochlorothiazide (HYZAAR)  100-25 MG per tablet Take 1 tablet by mouth daily.    [provider]  Magnesium 250 MG TABS Take 1 tablet by mouth daily.    [provider]  metoCLOPramide (REGLAN) 5 MG tablet Take 5 mg by mouth every 6 (six) hours as needed for nausea.    [provider]  omeprazole (PRILOSEC) 20 MG capsule Take 20 mg by mouth daily.    [provider]  oxyCODONE (OXY IR/ROXICODONE) 5 MG immediate release tablet Take 5-10 mg by mouth every 3 (three) hours as needed for severe pain.    [provider]  PARoxetine (PAXIL) 20 MG tablet Take 20 mg by mouth at bedtime.    [provider]  Vitamin D, Ergocalciferol, (DRISDOL) 50000 UNITS CAPS capsule Take 50,000 Units by mouth every 7 (seven) days.    [provider]  zolpidem (AMBIEN) 5 MG tablet Take 5 mg by mouth at bedtime as needed for sleep.    [provider]    REVIEW OF SYSTEMS:  Review of Systems  Constitutional: Negative for chills, fever, malaise/fatigue and weight loss.  HENT: Negative for ear pain, hearing loss and tinnitus.   Eyes: Negative for blurred vision, double vision, pain and redness.  Respiratory: Negative for cough, hemoptysis and shortness of breath.   Cardiovascular: Negative for chest pain, palpitations, orthopnea and leg swelling.  Gastrointestinal: Positive for abdominal pain. Negative for constipation, diarrhea, nausea and vomiting.  Genitourinary: Negative for dysuria, frequency and hematuria.  Musculoskeletal: Negative for back pain, joint pain and neck pain.  Skin:       No acne, rash, or lesions  Neurological: Negative for dizziness, tremors, focal weakness and weakness.  Endo/Heme/Allergies: Negative for polydipsia. Does not bruise/bleed easily.  Psychiatric/Behavioral: Negative for depression. The patient is not nervous/anxious and does not have insomnia.      VITAL SIGNS:   Vitals:   09/26/18 1914 09/26/18 1915  BP: 109/87   Pulse: 81   Resp:  16   Temp: 98.4 F (36.9 C)   TempSrc: Oral   SpO2: 98%   Weight:  93 kg  Height:  5\' 5"  (1.651 m)   Wt Readings from Last 3 Encounters:  09/26/18 93 kg  01/29/15 90.7 kg    PHYSICAL EXAMINATION:  Physical Exam  Vitals reviewed. Constitutional: She is oriented to person, place, and time. She appears well-developed and well-nourished. No distress.  HENT:  Head: Normocephalic and atraumatic.  Mouth/Throat: Oropharynx is clear and moist.  Eyes: Pupils are equal, round, and reactive to light. Conjunctivae and EOM are normal. No scleral icterus.  Neck: Normal range of motion. Neck supple. No JVD present. No thyromegaly present.  Cardiovascular: Normal rate, regular rhythm and intact distal pulses. Exam reveals no gallop and no friction rub.  No murmur heard. Respiratory: Effort normal and breath sounds normal. No respiratory distress. She has no wheezes. She has no rales.  GI: Soft. Bowel sounds are normal. She exhibits no distension. There is abdominal tenderness.  Musculoskeletal: Normal range of motion.        General: No edema.     Comments: No arthritis, no gout  Lymphadenopathy:    She has no cervical adenopathy.  Neurological: She is alert and oriented to person, place, and time. No cranial nerve deficit.  No dysarthria, no aphasia  Skin: Skin is warm and dry. No rash noted. No erythema.  Psychiatric: She has a normal mood and affect. Her behavior is normal. Judgment and thought content normal.    LABORATORY PANEL:   CBC Recent Labs  Lab 09/26/18 1921  WBC 15.0*  HGB 13.8  HCT 41.3  PLT 271   ------------------------------------------------------------------------------------------------------------------  Chemistries  Recent Labs  Lab 09/26/18 1921  NA 136  K 3.4*  CL 103  CO2 23  GLUCOSE 134*  BUN 16  CREATININE 1.03*  CALCIUM 8.9  AST 51*  ALT 70*  ALKPHOS 64  BILITOT 1.1    ------------------------------------------------------------------------------------------------------------------  Cardiac Enzymes No results for input(s): TROPONINI in the last 168 hours. ------------------------------------------------------------------------------------------------------------------  RADIOLOGY:  Ct Abdomen Pelvis W Contrast  Result Date: 09/26/2018 CLINICAL DATA:  Abdominal pain with nausea and vomiting over the last 2 days. EXAM: CT ABDOMEN AND PELVIS WITH CONTRAST TECHNIQUE: Multidetector CT imaging of the abdomen and pelvis was performed using the standard protocol following bolus administration of intravenous contrast. CONTRAST:  OMNIPAQUE IOHEXOL 300 MG/ML  SOLN COMPARISON:  10/21/2009 FINDINGS: Lower chest: Small amount of pericardial fluid. Lung bases are clear. Hepatobiliary: Fatty change of the liver. No focal liver lesion. No calcified gallstones. Pancreas: Normal Spleen: 2 newly seen low densities within the spleen likely to be benign cysts. Caudal cyst measures 3.1 cm. Midportion cyst measures 3.4 cm. Adrenals/Urinary Tract: The adrenal glands are normal. Kidneys are normal. No cyst, mass, stone or hydronephrosis. Bladder is normal. Stomach/Bowel: Extensive sigmoid diverticulosis with acute diverticulitis. Surrounding  stranding but no drainable abscess. No other bowel finding of note. Vascular/Lymphatic: Aortic atherosclerosis. No aneurysm. IVC is normal. No retroperitoneal adenopathy. Reproductive: Previous hysterectomy.  No pelvic mass. Other: No free fluid or air. Musculoskeletal: Ordinary lower lumbar degenerative disc disease and degenerative facet disease. Old minor superior endplate fractures from T7 through T9. IMPRESSION: Extensive sigmoid diverticulosis with acute sigmoid diverticulitis. Regional stranding in the surrounding fat but without evidence of drainable abscess. No free fluid or air. Fatty change of the liver. Aortic atherosclerosis. Benign  splenic cysts, newly seen since 2011 but not significant. Electronically Signed   By: Paulina FusiMark  Shogry M.D.   On: 09/26/2018 21:33    EKG:   Orders placed or performed in visit on 01/29/15  . EKG 12-Lead  . EKG 12-Lead  . EKG 12-Lead  . EKG 12-Lead  . EKG 12-Lead    IMPRESSION AND PLAN:  Principal Problem:   Acute diverticulitis -seen on CT imaging, with leukocytosis, IV antibiotics initiated, supportive care with PRN analgesia and antiemetics Active Problems:   HTN (hypertension) -blood pressure is normotensive here, gentle IV fluids tonight for hydration, hold antihypertensives for now   GERD (gastroesophageal reflux disease) -not on scheduled medication for this, treat PRN   Depression -home dose antidepressant  Chart review performed and case discussed with ED provider. Labs, imaging and/or ECG reviewed by provider and discussed with patient/family. Management plans discussed with the patient and/or family.  DVT PROPHYLAXIS: SubQ lovenox   GI PROPHYLAXIS:  None  ADMISSION STATUS: Inpatient     CODE STATUS: Full  TOTAL TIME TAKING CARE OF THIS PATIENT: 45 minutes.   Barney DrainDavid F  09/26/2018, 10:18 PM  [facility]  Office  [phone]  CC: Primary care physician; Marisue IvanLinthavong, [place], MD  Note:  This document was prepared using Dragon voice recognition software and may include unintentional dictation errors.

## 2018-09-26 NOTE — ED Notes (Signed)
ED TO INPATIENT HANDOFF REPORT  ED Nurse Name and Phone #:  [facility] Nurse [phone]  Name/Age/Gender Joy Vaughan 68 y.o. female Room/Bed: ED07A/ED07A  Code Status   Code Status: Not on file  Home/SNF/Other Home Patient oriented to: self, place, time and situation Is this baseline? Yes   Triage Complete: Triage complete   Chief Complaint Abdominal Pain  Triage Note Pt to ED reporting abd pain with nausea and vomiting x 2 days. abd pain is worse after eating but not tender to the touch. Pt reports last week she was constipated. Pt took laxities and is now having BMs but pain is worsening. Hx of diverticulitis.    Allergies Allergies  Allergen Reactions  . Sulfa Antibiotics Itching and Rash    Level of Care/Admitting Diagnosis ED Disposition    ED Disposition Condition Comment   Admit  Hospital Area: [facility] [[id number]]  Level of Care: Med-Surg [16]  Diagnosis: Acute diverticulitis [[id number]]  Admitting Physician: Oralia Manis [[id number]]  Attending Physician: Oralia Manis [[id number]]  Estimated length of stay: past midnight tomorrow  Certification:: I certify this patient will need inpatient services for at least 2 midnights  PT Class (Do Not Modify): Inpatient [101]  PT Acc Code (Do Not Modify): Private [1]       Medical/Surgery History Past Medical History:  Diagnosis Date  . Diverticulosis   . GERD (gastroesophageal reflux disease)   . Hypertension   . Sleep apnea    Past Surgical History:  Procedure Laterality Date  . REPLACEMENT TOTAL KNEE       IV Location/Drains/Wounds Patient Lines/Drains/Airways Status   Active Line/Drains/Airways    Name:   Placement date:   Placement time:   Site:   Days:   Peripheral IV 09/26/18 Left Antecubital   09/26/18    2015    Antecubital   less than 1          Intake/Output Last 24 hours No intake or output data in the 24 hours ending 09/26/18 2334  Labs/Imaging Results for orders placed or  performed during the hospital encounter of 09/26/18 (from the past 48 hour(s))  Lipase, blood     Status: None   Collection Time: 09/26/18  7:21 PM  Result Value Ref Range   Lipase 24 11 - 51 U/L    Comment: Performed at [facility], [address] [place], [place] [address]  Comprehensive metabolic panel     Status: Abnormal   Collection Time: 09/26/18  7:21 PM  Result Value Ref Range   Sodium 136 135 - 145 mmol/L   Potassium 3.4 (L) 3.5 - 5.1 mmol/L   Chloride 103 98 - 111 mmol/L   CO2 23 22 - 32 mmol/L   Glucose, Bld 134 (H) 70 - 99 mg/dL   BUN 16 8 - 23 mg/dL   Creatinine, Ser 8.78 (H) 0.44 - 1.00 mg/dL   Calcium 8.9 8.9 - 67.6 mg/dL   Total Protein 7.6 6.5 - 8.1 g/dL   Albumin 4.2 3.5 - 5.0 g/dL   AST 51 (H) 15 - 41 U/L   ALT 70 (H) 0 - 44 U/L   Alkaline Phosphatase 64 38 - 126 U/L   Total Bilirubin 1.1 0.3 - 1.2 mg/dL   GFR calc non Af Amer 56 (L) >60 mL/min   GFR calc Af Amer >60 >60 mL/min   Anion gap 10 5 - 15    Comment: Performed at [facility], [address] [place], [place]  [address]  CBC     Status: Abnormal   Collection Time: 09/26/18  7:21 PM  Result Value Ref Range   WBC 15.0 (H) 4.0 - 10.5 K/uL   RBC 4.69 3.87 - 5.11 MIL/uL   Hemoglobin 13.8 12.0 - 15.0 g/dL   HCT 53.6 64.4 - 03.4 %   MCV 88.1 80.0 - 100.0 fL   MCH 29.4 26.0 - 34.0 pg   MCHC 33.4 30.0 - 36.0 g/dL   RDW 74.2 59.5 - 63.8 %   Platelets 271 150 - 400 K/uL   nRBC 0.0 0.0 - 0.2 %    Comment: Performed at [facility], [address] [place], [place] [address]  Urinalysis, Complete w Microscopic     Status: Abnormal   Collection Time: 09/26/18 10:57 PM  Result Value Ref Range   Color, Urine YELLOW (A) YELLOW   APPearance CLEAR (A) CLEAR   Specific Gravity, Urine >1.046 (H) 1.005 - 1.030   pH 6.0 5.0 - 8.0   Glucose, UA NEGATIVE NEGATIVE mg/dL   Hgb urine dipstick MODERATE (A) NEGATIVE   Bilirubin Urine NEGATIVE NEGATIVE   Ketones, ur NEGATIVE NEGATIVE  mg/dL   Protein, ur NEGATIVE NEGATIVE mg/dL   Nitrite NEGATIVE NEGATIVE   Leukocytes,Ua NEGATIVE NEGATIVE   RBC / HPF 0-5 0 - 5 RBC/hpf   WBC, UA 0-5 0 - 5 WBC/hpf   Bacteria, UA NONE SEEN NONE SEEN   Squamous Epithelial / LPF 0-5 0 - 5    Comment: Performed at [facility], [address] [place], [place] [address]   Ct Abdomen Pelvis W Contrast  Result Date: 09/26/2018 CLINICAL DATA:  Abdominal pain with nausea and vomiting over the last 2 days. EXAM: CT ABDOMEN AND PELVIS WITH CONTRAST TECHNIQUE: Multidetector CT imaging of the abdomen and pelvis was performed using the standard protocol following bolus administration of intravenous contrast. CONTRAST:  OMNIPAQUE IOHEXOL 300 MG/ML  SOLN COMPARISON:  10/21/2009 FINDINGS: Lower chest: Small amount of pericardial fluid. Lung bases are clear. Hepatobiliary: Fatty change of the liver. No focal liver lesion. No calcified gallstones. Pancreas: Normal Spleen: 2 newly seen low densities within the spleen likely to be benign cysts. Caudal cyst measures 3.1 cm. Midportion cyst measures 3.4 cm. Adrenals/Urinary Tract: The adrenal glands are normal. Kidneys are normal. No cyst, mass, stone or hydronephrosis. Bladder is normal. Stomach/Bowel: Extensive sigmoid diverticulosis with acute diverticulitis. Surrounding stranding but no drainable abscess. No other bowel finding of note. Vascular/Lymphatic: Aortic atherosclerosis. No aneurysm. IVC is normal. No retroperitoneal adenopathy. Reproductive: Previous hysterectomy.  No pelvic mass. Other: No free fluid or air. Musculoskeletal: Ordinary lower lumbar degenerative disc disease and degenerative facet disease. Old minor superior endplate fractures from T7 through T9. IMPRESSION: Extensive sigmoid diverticulosis with acute sigmoid diverticulitis. Regional stranding in the surrounding fat but without evidence of drainable abscess. No free fluid or air. Fatty change of the liver. Aortic atherosclerosis.  Benign splenic cysts, newly seen since 2011 but not significant. Electronically Signed   By: Paulina Fusi M.D.   On: 09/26/2018 21:33    Pending Labs [employer] (From admission, onward)    Start     Ordered   Signed and Held  HIV antibody (Routine Testing)  Once,   R     Signed and Held   Signed and Held  CBC  (enoxaparin (LOVENOX)    CrCl >/= 30 ml/min)  Once,   R    Comments:  Baseline for enoxaparin therapy IF NOT  ALREADY DRAWN.  Notify MD if PLT < 100 K.    Signed and Held   Signed and Held  Creatinine, serum  (enoxaparin (LOVENOX)    CrCl >/= 30 ml/min)  Once,   R    Comments:  Baseline for enoxaparin therapy IF NOT ALREADY DRAWN.    Signed and Held   Signed and Held  Creatinine, serum  (enoxaparin (LOVENOX)    CrCl >/= 30 ml/min)  Weekly,   R    Comments:  while on enoxaparin therapy    Signed and Held   Signed and Held  Basic metabolic panel  Tomorrow morning,   R     Signed and Held   Signed and Held  CBC  Tomorrow morning,   R     Signed and Held          Vitals/Pain Today's Vitals   09/26/18 1915 09/26/18 2013 09/26/18 2200 09/26/18 2230  BP:   (!) 124/53 (!) 117/54  Pulse:   83 86  Resp:    18  Temp:      TempSrc:      SpO2:   97% 95%  Weight: 93 kg     Height: 5\' 5"  (1.651 m)     PainSc: 8  10-Worst pain ever      Isolation Precautions No active isolations  Medications Medications  piperacillin-tazobactam (ZOSYN) IVPB 3.375 g (3.375 g Intravenous New Bag/Given 09/26/18 2147)  morphine 4 MG/ML injection 4 mg (4 mg Intravenous Given 09/26/18 2016)  ondansetron (ZOFRAN) injection 4 mg (4 mg Intravenous Given 09/26/18 2016)  0.9 %  sodium chloride infusion (1,000 mLs Intravenous New Bag/Given 09/26/18 2016)  iohexol (OMNIPAQUE) 300 MG/ML solution 30 mL (30 mLs Oral Contrast Given 09/26/18 2022)  LORazepam (ATIVAN) injection 1 mg (1 mg Intravenous Given 09/26/18 2050)  iohexol (OMNIPAQUE) 300 MG/ML solution 100 mL (100 mLs Intravenous Contrast Given 09/26/18  2106)  morphine 4 MG/ML injection 4 mg (4 mg Intravenous Given 09/26/18 2143)    Mobility walks with device Low fall risk   Focused Assessments ABT   Recommendations: See Admitting Provider Note  Report given to:   Additional Notes:

## 2018-09-26 NOTE — ED Notes (Signed)
Pt became very anxious when about to go to CT scan.  MD notified and at bedside, VORB for 1mg  ativan placed.

## 2018-09-26 NOTE — ED Provider Notes (Signed)
[facility] Emergency Department Provider Note   ____________________________________________    I have reviewed the triage vital signs and the nursing notes.   HISTORY  Chief Complaint Abdominal Pain     HPI Joy Vaughan is a 68 y.o. female who presents with complaints of abdominal pain.  Patient reports worsening lower abdominal pain over the last 3 days.  She reports it is become quite severe and she has significant pain if she tries to sit forward and even if she just moves.  She denies fevers or chills.  No nausea or vomiting.  Decreased p.o. appetite.  Reports normal stool 2 days ago.  Does have a history of diverticulitis.  Does take Eliquis  Past Medical History:  Diagnosis Date  . GERD (gastroesophageal reflux disease)   . Hypertension   . Sleep apnea     There are no active problems to display for this patient.   Past Surgical History:  Procedure Laterality Date  . REPLACEMENT TOTAL KNEE      Prior to Admission medications   Medication Sig Start Date End Date Taking? Authorizing Provider  apixaban (ELIQUIS) 2.5 MG TABS tablet Take 2.5 mg by mouth 2 (two) times daily.    [provider]  Calcium Carbonate-Vitamin D (CALCIUM + D PO) Take 1 tablet by mouth daily.    [provider]  diltiazem (CARDIZEM CD) 120 MG 24 hr capsule Take 120 mg by mouth daily.    [provider]  losartan-hydrochlorothiazide (HYZAAR) 100-25 MG per tablet Take 1 tablet by mouth daily.    [provider]  Magnesium 250 MG TABS Take 1 tablet by mouth daily.    [provider]  metoCLOPramide (REGLAN) 5 MG tablet Take 5 mg by mouth every 6 (six) hours as needed for nausea.    [provider]  omeprazole (PRILOSEC) 20 MG capsule Take 20 mg by mouth daily.    [provider]  oxyCODONE (OXY IR/ROXICODONE) 5 MG immediate release tablet Take 5-10 mg by mouth every 3 (three) hours as needed for severe pain.     [provider]  PARoxetine (PAXIL) 20 MG tablet Take 20 mg by mouth at bedtime.    [provider]  Vitamin D, Ergocalciferol, (DRISDOL) 50000 UNITS CAPS capsule Take 50,000 Units by mouth every 7 (seven) days.    [provider]  zolpidem (AMBIEN) 5 MG tablet Take 5 mg by mouth at bedtime as needed for sleep.    [provider]     Allergies Sulfa antibiotics  History reviewed. No pertinent family history.  Social History Social History   Tobacco Use  . Smoking status: Former Games developer  . Smokeless tobacco: Never Used  Substance Use Topics  . Alcohol use: No  . Drug use: No    Review of Systems  Constitutional: No fever/chills Eyes: No visual changes.  ENT: No sore throat. Cardiovascular: Denies chest pain. Respiratory: Denies shortness of breath. Gastrointestinal: As above Genitourinary: Negative for dysuria.  Hematuria Musculoskeletal: Negative for back pain. Skin: Negative for rash. Neurological: Negative for headaches   ____________________________________________   PHYSICAL EXAM:  VITAL SIGNS: ED Triage Vitals  Enc Vitals Group     BP 09/26/18 1914 109/87     Pulse Rate 09/26/18 1914 81     Resp 09/26/18 1914 16     Temp 09/26/18 1914 98.4 F (36.9 C)     Temp Source 09/26/18 1914 Oral     SpO2 09/26/18 1914  98 %     Weight 09/26/18 1915 93 kg (205 lb)     Height 09/26/18 1915 1.651 m (5\' 5" )     Head Circumference --      Peak Flow --      Pain Score 09/26/18 1915 8     Pain Loc --      Pain Edu? --      Excl. in GC? --     Constitutional: Alert and oriented. No acute distress.  Eyes: Conjunctivae are normal.   Nose: No congestion/rhinnorhea. Mouth/Throat: Mucous membranes are moist.    Cardiovascular: Normal rate, regular rhythm. Grossly normal heart sounds.  Good peripheral circulation. Respiratory: Normal respiratory effort.  No retractions. Lungs CTAB. Gastrointestinal: Significant tenderness palpation  the left lower quadrant and suprapubically, no distention, no CVA tenderness  Musculoskeletal: .  Warm and well perfused Neurologic:  Normal speech and language. No gross focal neurologic deficits are appreciated.  Skin:  Skin is warm, dry and intact. No rash noted. Psychiatric: Mood and affect are normal. Speech and behavior are normal.  ____________________________________________   LABS (all labs ordered are listed, but only abnormal results are displayed)  Labs Reviewed  COMPREHENSIVE METABOLIC PANEL - Abnormal; Notable for the following components:      Result Value   Potassium 3.4 (*)    Glucose, Bld 134 (*)    Creatinine, Ser 1.03 (*)    AST 51 (*)    ALT 70 (*)    GFR calc non Af Amer 56 (*)    All other components within normal limits  CBC - Abnormal; Notable for the following components:   WBC 15.0 (*)    All other components within normal limits  LIPASE, BLOOD  URINALYSIS, COMPLETE (UACMP) WITH MICROSCOPIC   ____________________________________________  EKG  None ____________________________________________  RADIOLOGY  CT abdomen pelvis ____________________________________________   PROCEDURES  Procedure(s) performed: No  Procedures   Critical Care performed: No ____________________________________________   INITIAL IMPRESSION / ASSESSMENT AND PLAN / ED COURSE  Pertinent labs & imaging results that were available during my care of the patient were reviewed by me and considered in my medical decision making (see chart for details).  Patient presents with lower abdominal pain, with tenderness palpation, elevated white blood cell count, suspicious for diverticulitis.  We will obtain CT abdomen pelvis, give IV morphine, IV Zofran, IV fluids and keep her n.p.o.  Patient is highly claustrophobic we did give 0.5 mg of IV Ativan to allow her to tolerate CT scan  ----------------------------------------- 9:40 PM on  09/26/2018 -----------------------------------------  CT demonstrates extensive diverticulosis with sigmoid diverticulitis, no abscess, will give IV Zosyn and additional pain medications and admit to the hospitalist    ____________________________________________   FINAL CLINICAL IMPRESSION(S) / ED DIAGNOSES  Final diagnoses:  Sigmoid diverticulitis        Note:  This document was prepared using Dragon voice recognition software and may include unintentional dictation errors.   Jene EveryKinner, , MD 09/26/18 2141

## 2018-09-26 NOTE — ED Triage Notes (Signed)
Pt to ED reporting abd pain with nausea and vomiting x 2 days. abd pain is worse after eating but not tender to the touch. Pt reports last week she was constipated. Pt took laxities and is now having BMs but pain is worsening. Hx of diverticulitis.

## 2018-09-27 ENCOUNTER — Other Ambulatory Visit: Payer: Self-pay

## 2018-09-27 LAB — BASIC METABOLIC PANEL
Anion gap: 7 (ref 5–15)
BUN: 13 mg/dL (ref 8–23)
CO2: 27 mmol/L (ref 22–32)
Calcium: 8.1 mg/dL — ABNORMAL LOW (ref 8.9–10.3)
Chloride: 103 mmol/L (ref 98–111)
Creatinine, Ser: 0.89 mg/dL (ref 0.44–1.00)
GFR calc Af Amer: >60 mL/min (ref >60)
GFR calc non Af Amer: >60 mL/min (ref >60)
Glucose, Bld: 103 mg/dL — ABNORMAL HIGH (ref 70–99)
Potassium: 3.2 mmol/L — ABNORMAL LOW (ref 3.5–5.1)
SODIUM: 137 mmol/L (ref 135–145)

## 2018-09-27 LAB — CBC
HCT: 38.2 % (ref 36.0–46.0)
Hemoglobin: 12.5 g/dL (ref 12.0–15.0)
MCH: 29.8 pg (ref 26.0–34.0)
MCHC: 32.7 g/dL (ref 30.0–36.0)
MCV: 91.2 fL (ref 80.0–100.0)
PLATELETS: 240 10*3/uL (ref 150–400)
RBC: 4.19 MIL/uL (ref 3.87–5.11)
RDW: 13.4 % (ref 11.5–15.5)
WBC: 14.7 10*3/uL — ABNORMAL HIGH (ref 4.0–10.5)
nRBC: 0 % (ref 0.0–0.2)

## 2018-09-27 MED ORDER — OXYCODONE HCL 5 MG PO TABS
5.0000 mg | ORAL_TABLET | ORAL | Status: DC | PRN
  Administered 2018-09-27 (×2): 5 mg via ORAL
  Filled 2018-09-27 (×3): qty 1

## 2018-09-27 MED ORDER — PAROXETINE HCL 20 MG PO TABS
20.0000 mg | ORAL_TABLET | Freq: Every day | ORAL | Status: DC
  Administered 2018-09-27 – 2018-09-29 (×3): 20 mg via ORAL
  Filled 2018-09-27 (×5): qty 1

## 2018-09-27 MED ORDER — METRONIDAZOLE IN NACL 5-0.79 MG/ML-% IV SOLN
500.0000 mg | Freq: Three times a day (TID) | INTRAVENOUS | Status: DC
  Administered 2018-09-27 – 2018-09-28 (×5): 500 mg via INTRAVENOUS
  Filled 2018-09-27 (×6): qty 100

## 2018-09-27 MED ORDER — SODIUM CHLORIDE 0.9 % IV SOLN
2.0000 g | INTRAVENOUS | Status: DC
  Administered 2018-09-27 – 2018-09-28 (×2): 2 g via INTRAVENOUS
  Filled 2018-09-27: qty 20
  Filled 2018-09-27: qty 2

## 2018-09-27 MED ORDER — ACETAMINOPHEN 325 MG PO TABS
650.0000 mg | ORAL_TABLET | Freq: Four times a day (QID) | ORAL | Status: DC | PRN
  Administered 2018-09-28 – 2018-09-29 (×2): 650 mg via ORAL
  Filled 2018-09-27 (×2): qty 2

## 2018-09-27 MED ORDER — MORPHINE SULFATE (PF) 4 MG/ML IV SOLN: 4.0000 mg | INTRAVENOUS | Status: DC | PRN

## 2018-09-27 MED ORDER — ONDANSETRON HCL 4 MG/2ML IJ SOLN
4.0000 mg | Freq: Four times a day (QID) | INTRAMUSCULAR | Status: DC | PRN
  Administered 2018-09-27 – 2018-09-28 (×2): 4 mg via INTRAVENOUS
  Filled 2018-09-27: qty 2

## 2018-09-27 MED ORDER — ONDANSETRON HCL 4 MG PO TABS: 4.0000 mg | ORAL_TABLET | Freq: Four times a day (QID) | ORAL | Status: DC | PRN

## 2018-09-27 MED ORDER — SODIUM CHLORIDE 0.9 % IV SOLN
INTRAVENOUS | Status: DC
  Administered 2018-09-27 – 2018-09-29 (×3): via INTRAVENOUS

## 2018-09-27 MED ORDER — ONDANSETRON HCL 4 MG/2ML IJ SOLN
INTRAMUSCULAR | Status: AC
  Administered 2018-09-27: 4 mg via INTRAVENOUS
  Filled 2018-09-27: qty 2

## 2018-09-27 MED ORDER — ACETAMINOPHEN 650 MG RE SUPP: 650.0000 mg | Freq: Four times a day (QID) | RECTAL | Status: DC | PRN

## 2018-09-27 MED ORDER — ONDANSETRON HCL 4 MG/2ML IJ SOLN
4.0000 mg | Freq: Four times a day (QID) | INTRAMUSCULAR | Status: DC | PRN
  Administered 2018-09-27: 4 mg via INTRAVENOUS
  Filled 2018-09-27: qty 2

## 2018-09-27 MED ORDER — PROMETHAZINE HCL 25 MG/ML IJ SOLN: 12.5000 mg | Freq: Four times a day (QID) | INTRAMUSCULAR | Status: DC | PRN

## 2018-09-27 MED ORDER — ENOXAPARIN SODIUM 40 MG/0.4ML ~~LOC~~ SOLN
40.0000 mg | SUBCUTANEOUS | Status: DC
  Filled 2018-09-27 (×2): qty 0.4

## 2018-09-27 MED ORDER — SODIUM CHLORIDE 0.9 % IV SOLN: INTRAVENOUS | Status: DC

## 2018-09-27 NOTE — Progress Notes (Signed)
[employer] [facility] - [place] at [facility]   PATIENT NAME: Joy Vaughan    MR#:  [id number]  DATE OF BIRTH:  07/13/1951  SUBJECTIVE:  CHIEF COMPLAINT:   Chief Complaint  Patient presents with  . Abdominal Pain  Patient seen and evaluated today Has nausea and vomiting Has abdominal discomfort Unable to tolerate liquid diet  REVIEW OF SYSTEMS:    ROS  CONSTITUTIONAL: No documented fever. No fatigue, weakness. No weight gain, no weight loss.  EYES: No blurry or double vision.  ENT: No tinnitus. No postnasal drip. No redness of the oropharynx.  RESPIRATORY: No cough, no wheeze, no hemoptysis. No dyspnea.  CARDIOVASCULAR: No chest pain. No orthopnea. No palpitations. No syncope.  GASTROINTESTINAL: Has nausea, has vomiting , no diarrhea. Has abdominal pain. No melena or hematochezia.  GENITOURINARY: No dysuria or hematuria.  ENDOCRINE: No polyuria or nocturia. No heat or cold intolerance.  HEMATOLOGY: No anemia. No bruising. No bleeding.  INTEGUMENTARY: No rashes. No lesions.  MUSCULOSKELETAL: No arthritis. No swelling. No gout.  NEUROLOGIC: No numbness, tingling, or ataxia. No seizure-type activity.  PSYCHIATRIC: No anxiety. No insomnia. No ADD.   DRUG ALLERGIES:   Allergies  Allergen Reactions  . Sulfa Antibiotics Itching and Rash    VITALS:  Blood pressure (!) 116/58, pulse 85, temperature 97.8 F (36.6 C), temperature source Oral, resp. rate 20, height 5\' 5"  (1.651 m), weight 93 kg, SpO2 93 %.  PHYSICAL EXAMINATION:   Physical Exam  GENERAL:  68 y.o.-year-old patient lying in the bed with no acute distress.  EYES: Pupils equal, round, reactive to light and accommodation. No scleral icterus. Extraocular muscles intact.  HEENT: Head atraumatic, normocephalic. Oropharynx and nasopharynx clear.  NECK:  Supple, no jugular venous distention. No thyroid enlargement, no tenderness.  LUNGS: Normal breath sounds bilaterally, no wheezing, rales, rhonchi. No use of  accessory muscles of respiration.  CARDIOVASCULAR: S1, S2 normal. No murmurs, rubs, or gallops.  ABDOMEN: Soft, tenderness around umbilicus, nondistended. Bowel sounds present. No organomegaly or mass.  EXTREMITIES: No cyanosis, clubbing or edema b/l.    NEUROLOGIC: Cranial nerves II through XII are intact. No focal Motor or sensory deficits b/l.   PSYCHIATRIC: The patient is alert and oriented x 3.  SKIN: No obvious rash, lesion, or ulcer.   LABORATORY PANEL:   CBC Recent Labs  Lab 09/27/18 0432  WBC 14.7*  HGB 12.5  HCT 38.2  PLT 240   ------------------------------------------------------------------------------------------------------------------ Chemistries  Recent Labs  Lab 09/26/18 1921 09/27/18 0432  NA 136 137  K 3.4* 3.2*  CL 103 103  CO2 23 27  GLUCOSE 134* 103*  BUN 16 13  CREATININE 1.03* 0.89  CALCIUM 8.9 8.1*  AST 51*  --   ALT 70*  --   ALKPHOS 64  --   BILITOT 1.1  --    ------------------------------------------------------------------------------------------------------------------  Cardiac Enzymes No results for input(s): TROPONINI in the last 168 hours. ------------------------------------------------------------------------------------------------------------------  RADIOLOGY:  Ct Abdomen Pelvis W Contrast  Result Date: 09/26/2018 CLINICAL DATA:  Abdominal pain with nausea and vomiting over the last 2 days. EXAM: CT ABDOMEN AND PELVIS WITH CONTRAST TECHNIQUE: Multidetector CT imaging of the abdomen and pelvis was performed using the standard protocol following bolus administration of intravenous contrast. CONTRAST:  OMNIPAQUE IOHEXOL 300 MG/ML  SOLN COMPARISON:  10/21/2009 FINDINGS: Lower chest: Small amount of pericardial fluid. Lung bases are clear. Hepatobiliary: Fatty change of the liver. No focal liver lesion. No calcified gallstones. Pancreas: Normal Spleen: 2 newly seen  low densities within the spleen likely to be benign cysts. Caudal  cyst measures 3.1 cm. Midportion cyst measures 3.4 cm. Adrenals/Urinary Tract: The adrenal glands are normal. Kidneys are normal. No cyst, mass, stone or hydronephrosis. Bladder is normal. Stomach/Bowel: Extensive sigmoid diverticulosis with acute diverticulitis. Surrounding stranding but no drainable abscess. No other bowel finding of note. Vascular/Lymphatic: Aortic atherosclerosis. No aneurysm. IVC is normal. No retroperitoneal adenopathy. Reproductive: Previous hysterectomy.  No pelvic mass. Other: No free fluid or air. Musculoskeletal: Ordinary lower lumbar degenerative disc disease and degenerative facet disease. Old minor superior endplate fractures from T7 through T9. IMPRESSION: Extensive sigmoid diverticulosis with acute sigmoid diverticulitis. Regional stranding in the surrounding fat but without evidence of drainable abscess. No free fluid or air. Fatty change of the liver. Aortic atherosclerosis. Benign splenic cysts, newly seen since 2011 but not significant. Electronically Signed   By: Paulina Fusi M.D.   On: 09/26/2018 21:33     ASSESSMENT AND PLAN:  68 year old female patient with history of diverticulosis, GERD, hypertension, sleep apnea currently under hospitalist service for abdominal pain, nausea and vomiting  -Acute diverticulitis IV fluids Continue IV antibiotics that is IV Flagyl and IV Rocephin antibiotics Monitor electrolytes  -Acute hypokalemia Replace potassium intravenously  -GERD IV proton pump inhibitor  -Abdominal pain PRN IV morphine and oral narcotics for control of abdominal pain  -Leukocytosis secondary to diverticulitis  All the records are reviewed and case discussed with Care Management/Social Worker. Management plans discussed with the patient, family and they are in agreement.  CODE STATUS: Full code  DVT Prophylaxis: SCDs  TOTAL TIME TAKING CARE OF THIS PATIENT: 45 minutes.   POSSIBLE D/C IN 2 to 3 DAYS, DEPENDING ON CLINICAL  CONDITION.  Ihor Austin M.D on 09/27/2018 at 12:44 PM  Between 7am to 6pm - Pager - [phone]  After 6pm go to [email or web address] - password [email or web address] [facility]  [facility]  Office  [phone]  CC: Primary care physician; Marisue Ivan, MD  Note: This dictation was prepared with Dragon dictation along with smaller phrase technology. Any transcriptional errors that result from this process are unintentional.

## 2018-09-27 NOTE — Progress Notes (Signed)
Went into pt room to give meds and pt is upset about care. States all we want to do is dope her up and keep her sleep. States she has been in bed x3 days and nothing has been done. I informed pt that some pain meds will make you sleepy and asked if she wanted me to call the doctor and maybe get something less sedating and she states no, says she has not ate in 3 days and I informed that she had a clear diet on prev shift and was nauseated after trying something so doctor made her npo again. States no one has collected her urine or asked about bm, I informed her that we do collect urine for I&O and only collect urine if we have an order for culture. I placed a hat in her bsc. I also asked about bm since I did not have a chance to yet as she was frustrated as soon as I entered room. Asked pt if I could take a quick listen and look at her and she states the doctor should be doing that and feeling on my stomach for pain so I did not press. I asked pt is there was anything else I can do for her and apologized to her and stated that she voice these concerns to her doctor when they round in the am. Husband at bedside and very pleasant and apologetic. Will cont to monitor.

## 2018-09-27 NOTE — Progress Notes (Signed)
Advanced care plan. Purpose of the Encounter: CODE STATUS Parties in Attendance: Patient and family Patient's Decision Capacity: Good Subjective/Patient's story: Presented to emergency room for abdominal pain nausea and vomiting Objective/Medical story Patient has acute diverticulitis on CAT scan Needs IV antibiotics fluids and pain management Goals of care determination:  Advance care directives and goals of care discussed , patient wants everything which includes cpr, intubation if need arises CODE STATUS: Full code Time spent discussing advanced care planning: 16 minutes

## 2018-09-27 NOTE — Progress Notes (Addendum)
Pt with emesis. Phenergan ordered, pt does not want anything sedating. zofran ordered. Pt apologized for outburst of frustration earlier in shift. zofran given

## 2018-09-28 ENCOUNTER — Ambulatory Visit: Payer: Medicare Other

## 2018-09-28 LAB — BASIC METABOLIC PANEL
Anion gap: 8 (ref 5–15)
BUN: 9 mg/dL (ref 8–23)
CHLORIDE: 107 mmol/L (ref 98–111)
CO2: 25 mmol/L (ref 22–32)
Calcium: 8 mg/dL — ABNORMAL LOW (ref 8.9–10.3)
Creatinine, Ser: 0.64 mg/dL (ref 0.44–1.00)
GFR calc Af Amer: >60 mL/min (ref >60)
GFR calc non Af Amer: >60 mL/min (ref >60)
Glucose, Bld: 90 mg/dL (ref 70–99)
Potassium: 3.2 mmol/L — ABNORMAL LOW (ref 3.5–5.1)
Sodium: 140 mmol/L (ref 135–145)

## 2018-09-28 LAB — CBC
HCT: 34.2 % — ABNORMAL LOW (ref 36.0–46.0)
Hemoglobin: 11.1 g/dL — ABNORMAL LOW (ref 12.0–15.0)
MCH: 29.4 pg (ref 26.0–34.0)
MCHC: 32.5 g/dL (ref 30.0–36.0)
MCV: 90.7 fL (ref 80.0–100.0)
Platelets: 224 10*3/uL (ref 150–400)
RBC: 3.77 MIL/uL — ABNORMAL LOW (ref 3.87–5.11)
RDW: 13.5 % (ref 11.5–15.5)
WBC: 11.2 10*3/uL — ABNORMAL HIGH (ref 4.0–10.5)
nRBC: 0 % (ref 0.0–0.2)

## 2018-09-28 LAB — HIV ANTIBODY (ROUTINE TESTING W REFLEX): HIV Screen 4th Generation wRfx: NONREACTIVE

## 2018-09-28 LAB — MAGNESIUM: Magnesium: 2.2 mg/dL (ref 1.7–2.4)

## 2018-09-28 MED ORDER — METOCLOPRAMIDE HCL 5 MG/ML IJ SOLN
5.0000 mg | Freq: Four times a day (QID) | INTRAMUSCULAR | Status: DC | PRN
  Administered 2018-09-28: 5 mg via INTRAVENOUS
  Filled 2018-09-28: qty 2

## 2018-09-28 MED ORDER — METRONIDAZOLE 500 MG PO TABS
500.0000 mg | ORAL_TABLET | Freq: Three times a day (TID) | ORAL | Status: DC
  Administered 2018-09-28: 500 mg via ORAL
  Filled 2018-09-28 (×2): qty 1

## 2018-09-28 MED ORDER — PANTOPRAZOLE SODIUM 40 MG PO TBEC
40.0000 mg | DELAYED_RELEASE_TABLET | Freq: Every day | ORAL | Status: DC
  Administered 2018-09-29: 40 mg via ORAL
  Filled 2018-09-28 (×2): qty 1

## 2018-09-28 MED ORDER — CIPROFLOXACIN HCL 500 MG PO TABS
500.0000 mg | ORAL_TABLET | Freq: Two times a day (BID) | ORAL | Status: DC
  Administered 2018-09-28: 500 mg via ORAL
  Filled 2018-09-28: qty 1

## 2018-09-28 MED ORDER — POTASSIUM CHLORIDE CRYS ER 20 MEQ PO TBCR
40.0000 meq | EXTENDED_RELEASE_TABLET | Freq: Once | ORAL | Status: AC
  Administered 2018-09-28: 40 meq via ORAL
  Filled 2018-09-28: qty 2

## 2018-09-28 NOTE — Progress Notes (Signed)
[facility] [facility] -  at [facility]   PATIENT NAME: Joy StackLinda Senske    MR#:  [id number]  DATE OF BIRTH:  08/09/1951  SUBJECTIVE:  CHIEF COMPLAINT:   Chief Complaint  Patient presents with  . Abdominal Pain  Patient has abdominal sore. REVIEW OF SYSTEMS:    ROS  CONSTITUTIONAL: No documented fever. No fatigue, weakness. No weight gain, no weight loss.  EYES: No blurry or double vision.  ENT: No tinnitus. No postnasal drip. No redness of the oropharynx.  RESPIRATORY: No cough, no wheeze, no hemoptysis. No dyspnea.  CARDIOVASCULAR: No chest pain. No orthopnea. No palpitations. No syncope.  GASTROINTESTINAL: No nausea or vomiting , no diarrhea. Has abdominal pain. No melena or hematochezia.  GENITOURINARY: No dysuria or hematuria.  ENDOCRINE: No polyuria or nocturia. No heat or cold intolerance.  HEMATOLOGY: No anemia. No bruising. No bleeding.  INTEGUMENTARY: No rashes. No lesions.  MUSCULOSKELETAL: No arthritis. No swelling. No gout.  NEUROLOGIC: No numbness, tingling, or ataxia. No seizure-type activity.  PSYCHIATRIC: No anxiety. No insomnia. No ADD.   DRUG ALLERGIES:   Allergies  Allergen Reactions  . Sulfa Antibiotics Itching and Rash    VITALS:  Blood pressure (!) 154/70, pulse 71, temperature (!) 97.5 F (36.4 C), temperature source Oral, resp. rate 19, height 5\' 5"  (1.651 m), weight 93 kg, SpO2 99 %.  PHYSICAL EXAMINATION:   Physical Exam  GENERAL:  68 y.o.-year-old patient lying in the bed with no acute distress.  EYES: Pupils equal, round, reactive to light and accommodation. No scleral icterus. Extraocular muscles intact.  HEENT: Head atraumatic, normocephalic. Oropharynx and nasopharynx clear.  NECK:  Supple, no jugular venous distention. No thyroid enlargement, no tenderness.  LUNGS: Normal breath sounds bilaterally, no wheezing, rales, rhonchi. No use of accessory muscles of respiration.  CARDIOVASCULAR: S1, S2 normal. No murmurs, rubs, or  gallops.  ABDOMEN: Soft, tenderness around umbilicus, nondistended. Bowel sounds present. No organomegaly or mass.  EXTREMITIES: No cyanosis, clubbing or edema b/l.    NEUROLOGIC: Cranial nerves II through XII are intact. No focal Motor or sensory deficits b/l.   PSYCHIATRIC: The patient is alert and oriented x 3.  SKIN: No obvious rash, lesion, or ulcer.   LABORATORY PANEL:   CBC Recent Labs  Lab 09/28/18 0536  WBC 11.2*  HGB 11.1*  HCT 34.2*  PLT 224   ------------------------------------------------------------------------------------------------------------------ Chemistries  Recent Labs  Lab 09/26/18 1921  09/28/18 0536  NA 136   < > 140  K 3.4*   < > 3.2*  CL 103   < > 107  CO2 23   < > 25  GLUCOSE 134*   < > 90  BUN 16   < > 9  CREATININE 1.03*   < > 0.64  CALCIUM 8.9   < > 8.0*  MG  --   --  2.2  AST 51*  --   --   ALT 70*  --   --   ALKPHOS 64  --   --   BILITOT 1.1  --   --    < > = values in this interval not displayed.   ------------------------------------------------------------------------------------------------------------------  Cardiac Enzymes No results for input(s): TROPONINI in the last 168 hours. ------------------------------------------------------------------------------------------------------------------  RADIOLOGY:  Ct Abdomen Pelvis W Contrast  Result Date: 09/26/2018 CLINICAL DATA:  Abdominal pain with nausea and vomiting over the last 2 days. EXAM: CT ABDOMEN AND PELVIS WITH CONTRAST TECHNIQUE: Multidetector CT imaging of the abdomen and pelvis was performed using  the standard protocol following bolus administration of intravenous contrast. CONTRAST:  OMNIPAQUE IOHEXOL 300 MG/ML  SOLN COMPARISON:  10/21/2009 FINDINGS: Lower chest: Small amount of pericardial fluid. Lung bases are clear. Hepatobiliary: Fatty change of the liver. No focal liver lesion. No calcified gallstones. Pancreas: Normal Spleen: 2 newly seen low densities  within the spleen likely to be benign cysts. Caudal cyst measures 3.1 cm. Midportion cyst measures 3.4 cm. Adrenals/Urinary Tract: The adrenal glands are normal. Kidneys are normal. No cyst, mass, stone or hydronephrosis. Bladder is normal. Stomach/Bowel: Extensive sigmoid diverticulosis with acute diverticulitis. Surrounding stranding but no drainable abscess. No other bowel finding of note. Vascular/Lymphatic: Aortic atherosclerosis. No aneurysm. IVC is normal. No retroperitoneal adenopathy. Reproductive: Previous hysterectomy.  No pelvic mass. Other: No free fluid or air. Musculoskeletal: Ordinary lower lumbar degenerative disc disease and degenerative facet disease. Old minor superior endplate fractures from T7 through T9. IMPRESSION: Extensive sigmoid diverticulosis with acute sigmoid diverticulitis. Regional stranding in the surrounding fat but without evidence of drainable abscess. No free fluid or air. Fatty change of the liver. Aortic atherosclerosis. Benign splenic cysts, newly seen since 2011 but not significant. Electronically Signed   By: Paulina Fusi M.D.   On: 09/26/2018 21:33     ASSESSMENT AND PLAN:  68 year old female patient with history of diverticulosis, GERD, hypertension, sleep apnea currently under hospitalist service for abdominal pain, nausea and vomiting  -Acute diverticulitis Continue IV fluids and to try clear liquid diet. She is on IV Flagyl and IV Rocephin antibiotics, changed to p.o. Cipro and Flagyl.  -Acute hypokalemia Potassium supplement.  -GERD Protonix p.o.  -Abdominal pain PRN IV morphine and oral narcotics for control of abdominal pain  -Leukocytosis secondary to diverticulitis, improving.  All the records are reviewed and case discussed with Care Management/Social Worker. Management plans discussed with the patient, her husband and they are in agreement.  CODE STATUS: Full code  DVT Prophylaxis: SCDs  TOTAL TIME TAKING CARE OF THIS PATIENT: 28  minutes.   POSSIBLE D/C IN 2 DAYS, DEPENDING ON CLINICAL CONDITION.  Shaune Pollack M.D on 09/28/2018 at 2:52 PM  Between 7am to 6pm - Pager - [phone]  After 6pm go to [email or web address] - password [email or web address] [facility]  [facility]  Office  [phone]  CC: Primary care physician; Marisue Ivan, MD  Note: This dictation was prepared with Dragon dictation along with smaller phrase technology. Any transcriptional errors that result from this process are unintentional.

## 2018-09-29 LAB — BASIC METABOLIC PANEL
Anion gap: 8 (ref 5–15)
BUN: 10 mg/dL (ref 8–23)
CO2: 23 mmol/L (ref 22–32)
Calcium: 8.3 mg/dL — ABNORMAL LOW (ref 8.9–10.3)
Chloride: 109 mmol/L (ref 98–111)
Creatinine, Ser: 0.62 mg/dL (ref 0.44–1.00)
GFR calc Af Amer: >60 mL/min (ref >60)
GFR calc non Af Amer: >60 mL/min (ref >60)
Glucose, Bld: 96 mg/dL (ref 70–99)
Potassium: 3.2 mmol/L — ABNORMAL LOW (ref 3.5–5.1)
Sodium: 140 mmol/L (ref 135–145)

## 2018-09-29 LAB — CLOSTRIDIUM DIFFICILE BY PCR, REFLEXED: Toxigenic C. Difficile by PCR: POSITIVE — AB

## 2018-09-29 LAB — C DIFFICILE QUICK SCREEN W PCR REFLEX
C Diff antigen: POSITIVE — AB
C Diff toxin: NEGATIVE

## 2018-09-29 MED ORDER — POTASSIUM CHLORIDE CRYS ER 20 MEQ PO TBCR
40.0000 meq | EXTENDED_RELEASE_TABLET | Freq: Once | ORAL | Status: AC
  Administered 2018-09-29: 40 meq via ORAL
  Filled 2018-09-29: qty 2

## 2018-09-29 MED ORDER — VANCOMYCIN 50 MG/ML ORAL SOLUTION
125.0000 mg | Freq: Four times a day (QID) | ORAL | Status: DC
  Administered 2018-09-29 – 2018-09-30 (×5): 125 mg via ORAL
  Filled 2018-09-29 (×8): qty 2.5

## 2018-09-29 NOTE — Progress Notes (Signed)
[employer] [facility] - [place] at [facility]   PATIENT NAME: Joy Vaughan    MR#:  [id number]  DATE OF BIRTH:  [birth year]  SUBJECTIVE:  CHIEF COMPLAINT:   Chief Complaint  Patient presents with  . Abdominal Pain  Patient had multiple diarrhea last night.  Stool C. difficile test is positive. REVIEW OF SYSTEMS:    ROS  CONSTITUTIONAL: No documented fever. No fatigue, weakness. No weight gain, no weight loss.  EYES: No blurry or double vision.  ENT: No tinnitus. No postnasal drip. No redness of the oropharynx.  RESPIRATORY: No cough, no wheeze, no hemoptysis. No dyspnea.  CARDIOVASCULAR: No chest pain. No orthopnea. No palpitations. No syncope.  GASTROINTESTINAL: No nausea or vomiting , had diarrhea. Has abdominal pain. No melena or hematochezia.  GENITOURINARY: No dysuria or hematuria.  ENDOCRINE: No polyuria or nocturia. No heat or cold intolerance.  HEMATOLOGY: No anemia. No bruising. No bleeding.  INTEGUMENTARY: No rashes. No lesions.  MUSCULOSKELETAL: No arthritis. No swelling. No gout.  NEUROLOGIC: No numbness, tingling, or ataxia. No seizure-type activity.  PSYCHIATRIC: No anxiety. No insomnia. No ADD.   DRUG ALLERGIES:   Allergies  Allergen Reactions  . Sulfa Antibiotics Itching and Rash    VITALS:  Blood pressure (!) 148/73, pulse (!) 54, temperature 98 F (36.7 C), temperature source Oral, resp. rate 18, height 5\' 5"  (1.651 m), weight 93 kg, SpO2 99 %.  PHYSICAL EXAMINATION:   Physical Exam  GENERAL:  68 y.o.-year-old patient lying in the bed with no acute distress.  EYES: Pupils equal, round, reactive to light and accommodation. No scleral icterus. Extraocular muscles intact.  HEENT: Head atraumatic, normocephalic. Oropharynx and nasopharynx clear.  NECK:  Supple, no jugular venous distention. No thyroid enlargement, no tenderness.  LUNGS: Normal breath sounds bilaterally, no wheezing, rales, rhonchi. No use of accessory muscles of respiration.   CARDIOVASCULAR: S1, S2 normal. No murmurs, rubs, or gallops.  ABDOMEN: Soft, tenderness around umbilicus, nondistended. Bowel sounds present. No organomegaly or mass.  EXTREMITIES: No cyanosis, clubbing or edema b/l.    NEUROLOGIC: Cranial nerves II through XII are intact. No focal Motor or sensory deficits b/l.   PSYCHIATRIC: The patient is alert and oriented x 3.  SKIN: No obvious rash, lesion, or ulcer.   LABORATORY PANEL:   CBC Recent Labs  Lab 09/28/18 0536  WBC 11.2*  HGB 11.1*  HCT 34.2*  PLT 224   ------------------------------------------------------------------------------------------------------------------ Chemistries  Recent Labs  Lab 09/26/18 1921  09/28/18 0536 09/29/18 0438  NA 136   < > 140 140  K 3.4*   < > 3.2* 3.2*  CL 103   < > 107 109  CO2 23   < > 25 23  GLUCOSE 134*   < > 90 96  BUN 16   < > 9 10  CREATININE 1.03*   < > 0.64 0.62  CALCIUM 8.9   < > 8.0* 8.3*  MG  --   --  2.2  --   AST 51*  --   --   --   ALT 70*  --   --   --   ALKPHOS 64  --   --   --   BILITOT 1.1  --   --   --    < > = values in this interval not displayed.   ------------------------------------------------------------------------------------------------------------------  Cardiac Enzymes No results for input(s): TROPONINI in the last 168 hours. ------------------------------------------------------------------------------------------------------------------  RADIOLOGY:  No results found.   ASSESSMENT AND PLAN:  68 year old female patient with history of diverticulosis, GERD, hypertension, sleep apnea currently under hospitalist service for abdominal pain, nausea and vomiting  -Acute C. difficile colitis. Continue IV fluids and and liquid diet. She was initially diagnosed with acute diverticulitis, on IV Flagyl and IV Rocephin antibiotics, changed to p.o. Cipro and Flagyl.  Leukocytosis improved. Start vancomycin p.o. 4 times a day.  -Abdominal pain Due to  above.  -Acute hypokalemia Potassium supplement.  -GERD Protonix p.o.  All the records are reviewed and case discussed with Care Management/Social Worker. Management plans discussed with the patient, her husband and they are in agreement.  CODE STATUS: Full code  DVT Prophylaxis: SCDs  TOTAL TIME TAKING CARE OF THIS PATIENT: 28 minutes.   POSSIBLE D/C IN 1-2 DAYS, DEPENDING ON CLINICAL CONDITION.  Shaune Pollack M.D on 09/29/2018 at 2:41 PM  Between 7am to 6pm - Pager - [phone]  After 6pm go to [email or web address] - password [email or web address] [facility]  [facility]  Office  [phone]  CC: Primary care physician; Marisue Ivan, MD  Note: This dictation was prepared with Dragon dictation along with smaller phrase technology. Any transcriptional errors that result from this process are unintentional.

## 2018-09-29 NOTE — Progress Notes (Signed)
Pt cdiff came back positive. Enteric precautions initiated. Pt c/o not being able to take po antibiotics. Will address with doc. Cont to monitor.

## 2018-09-29 NOTE — Progress Notes (Signed)
Patient lost IV access and really does not prefer to have another IV. Patient tolerating diet and is not on any IV medications. Per MD we may leave patient without IV access.

## 2018-09-29 NOTE — Plan of Care (Signed)

## 2018-09-29 NOTE — Care Management Important Message (Signed)
Important Message  Patient Details  Name: Joy Vaughan MRN: [id number] Date of Birth: 1950-10-07   Medicare Important Message Given:  Yes    Chapman Fitch, RN 09/29/2018, 4:11 PM

## 2018-09-29 NOTE — Progress Notes (Signed)
Patient has had some significant abdominal pain. This RN brought pain medication since she rated it an 8 out of 10. When giving the pain medication she was quite upset we offered her oxycodone pain medication. She thinks that we are trying to sedate her. She requests that all medications that could make her drowsy be immediately removed from her chart. I have given her tylenol for pain control. We will dc oxycodone and phenergan orders at patient request.

## 2018-09-30 LAB — BASIC METABOLIC PANEL
Anion gap: 6 (ref 5–15)
BUN: 7 mg/dL — ABNORMAL LOW (ref 8–23)
CO2: 26 mmol/L (ref 22–32)
Calcium: 8.5 mg/dL — ABNORMAL LOW (ref 8.9–10.3)
Chloride: 109 mmol/L (ref 98–111)
Creatinine, Ser: 0.6 mg/dL (ref 0.44–1.00)
GFR calc Af Amer: >60 mL/min (ref >60)
GFR calc non Af Amer: >60 mL/min (ref >60)
GLUCOSE: 99 mg/dL (ref 70–99)
Potassium: 3.6 mmol/L (ref 3.5–5.1)
Sodium: 141 mmol/L (ref 135–145)

## 2018-09-30 MED ORDER — VANCOMYCIN 50 MG/ML ORAL SOLUTION: 125.0000 mg | Freq: Four times a day (QID) | ORAL | 0 refills | Status: DC

## 2018-09-30 NOTE — Progress Notes (Signed)
Dr. Imogene Burn rounded and ordered discharge home. Discharge instructions, prescriptions and instructions for follow up appointment was reviewed with Joy Vaughan and her husband. Questions were encouraged and answered. Will call for wheelchair when she is dressed and ready.

## 2018-09-30 NOTE — Discharge Summary (Signed)
[facility] -  at [facility]   PATIENT NAME: Joy Vaughan    MR#:  [id number]  DATE OF BIRTH:  04/17/1951  DATE OF ADMISSION:  09/26/2018   ADMITTING PHYSICIAN: Oralia Manisavid Willis, Joy Vaughan  DATE OF DISCHARGE: 09/30/2018 PRIMARY CARE PHYSICIAN: Joy Vaughan, [place], Joy Vaughan   ADMISSION DIAGNOSIS:  Sigmoid diverticulitis [K57.32] DISCHARGE DIAGNOSIS:  Principal Problem:   Acute diverticulitis Active Problems:   GERD (gastroesophageal reflux disease)   Sleep apnea   HTN (hypertension)  SECONDARY DIAGNOSIS:   Past Medical History:  Diagnosis Date  . Diverticulosis   . GERD (gastroesophageal reflux disease)   . Hypertension   . Sleep apnea    HOSPITAL COURSE:  68 year old female patient with history of diverticulosis, GERD, hypertension, sleep apnea currently under hospitalist service for abdominal pain, nausea and vomiting  -Acute C. difficile colitis. She was on IV fluids and and liquid diet.  She tolerated soft diet. She was initially diagnosed with acute diverticulitis, on IV Flagyl and IV Rocephin antibiotics, changed to p.o. Cipro and Flagyl.  Leukocytosis improved. Continue vancomycin p.o. 4 times a day for total 10 days.  -Abdominal pain Due to above.  Improved.  -Acute hypokalemia Improved with potassium supplement.  -GERD Discontinued Protonix due to C. difficile colitis.  Hypertension.  Resume home hypertension medication.  DISCHARGE CONDITIONS:  Stable, discharge to home today. CONSULTS OBTAINED:   DRUG ALLERGIES:   Allergies  Allergen Reactions  . Sulfa Antibiotics Itching and Rash   DISCHARGE MEDICATIONS:   Allergies as of 09/30/2018      Reactions   Sulfa Antibiotics Itching, Rash      Medication List    TAKE these medications   acetaminophen 500 MG tablet Commonly known as:  TYLENOL Take 500 mg by mouth every 6 (six) hours as needed for mild pain, headache or fever.   BELVIQ 10 MG Tabs Generic drug:  Lorcaserin HCl Take  10 mg by mouth 2 (two) times daily.   calcium carbonate 500 MG chewable tablet Commonly known as:  TUMS - dosed in mg elemental calcium Chew 1 tablet by mouth as needed for indigestion or heartburn.   Inulin 2 g Chew Chew 2 each by mouth daily before breakfast.   losartan-hydrochlorothiazide 100-25 MG tablet Commonly known as:  HYZAAR Take 1 tablet by mouth daily.   PARoxetine 20 MG tablet Commonly known as:  PAXIL Take 20 mg by mouth at bedtime.   vancomycin 50 mg/mL  oral solution Commonly known as:  VANCOCIN Take 2.5 mLs (125 mg total) by mouth 4 (four) times daily.   Vitamin D (Ergocalciferol) 1.25 MG (50000 UT) Caps capsule Commonly known as:  DRISDOL Take 50,000 Units by mouth every 7 (seven) days.   VITAMIN D-1000 MAX ST 25 MCG (1000 UT) tablet Generic drug:  Cholecalciferol Take 1,000 Units by mouth daily.   zolpidem 5 MG tablet Commonly known as:  AMBIEN Take 5 mg by mouth at bedtime as needed for sleep.        DISCHARGE INSTRUCTIONS:  See AVS.  If you experience worsening of your admission symptoms, develop shortness of breath, life threatening emergency, suicidal or homicidal thoughts you must seek medical attention immediately by calling 911 or calling your Joy Vaughan immediately  if symptoms less severe.  You Must read complete instructions/literature along with all the possible adverse reactions/side effects for all the Medicines you take and that have been prescribed to you. Take any new Medicines after you have completely understood and accpet all the  possible adverse reactions/side effects.   Please note  You were cared for by a hospitalist during your hospital stay. If you have any questions about your discharge medications or the care you received while you were in the hospital after you are discharged, you can call the unit and asked to speak with the hospitalist on call if the hospitalist that took care of you is not available. Once you are discharged, your  primary care physician will handle any further medical issues. Please note that NO REFILLS for any discharge medications will be authorized once you are discharged, as it is imperative that you return to your primary care physician (or establish a relationship with a primary care physician if you do not have one) for your aftercare needs so that they can reassess your need for medications and monitor your lab values.    On the day of Discharge:  VITAL SIGNS:  Blood pressure (!) 163/77, pulse 70, temperature 97.8 F (36.6 C), temperature source Oral, resp. rate 20, height 5\' 5"  (1.651 m), weight 93 kg, SpO2 94 %. PHYSICAL EXAMINATION:  GENERAL:  68 y.o.-year-old patient lying in the bed with no acute distress.  EYES: Pupils equal, round, reactive to light and accommodation. No scleral icterus. Extraocular muscles intact.  HEENT: Head atraumatic, normocephalic. Oropharynx and nasopharynx clear.  NECK:  Supple, no jugular venous distention. No thyroid enlargement, no tenderness.  LUNGS: Normal breath sounds bilaterally, no wheezing, rales,rhonchi or crepitation. No use of accessory muscles of respiration.  CARDIOVASCULAR: S1, S2 normal. No murmurs, rubs, or gallops.  ABDOMEN: Soft, non-tender, non-distended. Bowel sounds present. No organomegaly or mass.  EXTREMITIES: No pedal edema, cyanosis, or clubbing.  NEUROLOGIC: Cranial nerves II through XII are intact. Muscle strength 5/5 in all extremities. Sensation intact. Gait not checked.  PSYCHIATRIC: The patient is alert and oriented x 3.  SKIN: No obvious rash, lesion, or ulcer.  DATA REVIEW:   CBC Recent Labs  Lab 09/28/18 0536  WBC 11.2*  HGB 11.1*  HCT 34.2*  PLT 224    Chemistries  Recent Labs  Lab 09/26/18 1921  09/28/18 0536  09/30/18 0548  NA 136   < > 140   < > 141  K 3.4*   < > 3.2*   < > 3.6  CL 103   < > 107   < > 109  CO2 23   < > 25   < > 26  GLUCOSE 134*   < > 90   < > 99  BUN 16   < > 9   < > 7*  CREATININE  1.03*   < > 0.64   < > 0.60  CALCIUM 8.9   < > 8.0*   < > 8.5*  MG  --   --  2.2  --   --   AST 51*  --   --   --   --   ALT 70*  --   --   --   --   ALKPHOS 64  --   --   --   --   BILITOT 1.1  --   --   --   --    < > = values in this interval not displayed.     Microbiology Results  Results for orders placed or performed during the hospital encounter of 09/26/18  C difficile quick scan w PCR reflex     Status: Abnormal   Collection Time: 09/29/18  3:54 AM  Result  Value Ref Range Status   C Diff antigen POSITIVE (A) NEGATIVE Final   C Diff toxin NEGATIVE NEGATIVE Final   C Diff interpretation Results are indeterminate. See PCR results.  Final    Comment: Performed at [facility], [address] [place], [place] [address]  C. Diff by PCR, Reflexed     Status: Abnormal   Collection Time: 09/29/18  3:54 AM  Result Value Ref Range Status   Toxigenic C. Difficile by PCR POSITIVE (A) NEGATIVE Final    Comment: Positive for toxigenic C. difficile with little to no toxin production. Only treat if clinical presentation suggests symptomatic illness. Performed at [facility], [address] [place], [place] [address]     RADIOLOGY:  No results found.   Management plans discussed with the patient, family and they are in agreement.  CODE STATUS: Full Code   TOTAL TIME TAKING CARE OF THIS PATIENT: 32 minutes.    Shaune Pollack M.D on 09/30/2018 at 1:11 PM  Between 7am to 6pm - Pager - [phone]  After 6pm go to [email or web address] - Social research officer, government  [facility] [place] [facility]  Office  [phone]  CC: Primary care physician; Joy Ivan, Joy Vaughan   Note: This dictation was prepared with Dragon dictation along with smaller phrase technology. Any transcriptional errors that result from this process are unintentional.

## 2018-10-03 ENCOUNTER — Ambulatory Visit: Payer: Medicare Other | Admitting: Physical Therapy

## 2018-11-02 DIAGNOSIS — C4361 Malignant melanoma of right upper limb, including shoulder: Secondary | ICD-10-CM | POA: Insufficient documentation

## 2018-12-26 ENCOUNTER — Ambulatory Visit: Admit: 2018-12-26 | Payer: Medicare Other | Admitting: Internal Medicine

## 2018-12-26 SURGERY — COLONOSCOPY WITH PROPOFOL
Anesthesia: General

## 2019-04-11 DIAGNOSIS — E782 Mixed hyperlipidemia: Secondary | ICD-10-CM | POA: Insufficient documentation

## 2020-07-30 ENCOUNTER — Emergency Department: Payer: Medicare Other

## 2020-07-30 ENCOUNTER — Emergency Department
Admission: EM | Admit: 2020-07-30 | Discharge: 2020-07-30 | Disposition: A | Discharge status: Home / Self Care | Payer: Medicare Other | Attending: Emergency Medicine | Admitting: Emergency Medicine

## 2020-07-30 ENCOUNTER — Encounter: Payer: Self-pay | Admitting: Emergency Medicine

## 2020-07-30 ENCOUNTER — Other Ambulatory Visit: Payer: Self-pay

## 2020-07-30 DIAGNOSIS — I10 Essential (primary) hypertension: Secondary | ICD-10-CM | POA: Insufficient documentation

## 2020-07-30 DIAGNOSIS — Z79899 Other long term (current) drug therapy: Secondary | ICD-10-CM | POA: Insufficient documentation

## 2020-07-30 DIAGNOSIS — Z87891 Personal history of nicotine dependence: Secondary | ICD-10-CM | POA: Diagnosis not present

## 2020-07-30 DIAGNOSIS — K219 Gastro-esophageal reflux disease without esophagitis: Secondary | ICD-10-CM | POA: Diagnosis not present

## 2020-07-30 DIAGNOSIS — K5792 Diverticulitis of intestine, part unspecified, without perforation or abscess without bleeding: Secondary | ICD-10-CM | POA: Diagnosis not present

## 2020-07-30 DIAGNOSIS — Z794 Long term (current) use of insulin: Secondary | ICD-10-CM | POA: Insufficient documentation

## 2020-07-30 DIAGNOSIS — R1032 Left lower quadrant pain: Secondary | ICD-10-CM | POA: Diagnosis present

## 2020-07-30 LAB — COMPREHENSIVE METABOLIC PANEL
ALT: 60 U/L — ABNORMAL HIGH (ref 0–44)
AST: 43 U/L — ABNORMAL HIGH (ref 15–41)
Albumin: 3.8 g/dL (ref 3.5–5.0)
Alkaline Phosphatase: 79 U/L (ref 38–126)
Anion gap: 11 (ref 5–15)
BUN: 13 mg/dL (ref 8–23)
CO2: 24 mmol/L (ref 22–32)
Calcium: 9 mg/dL (ref 8.9–10.3)
Chloride: 100 mmol/L (ref 98–111)
Creatinine, Ser: 0.74 mg/dL (ref 0.44–1.00)
GFR, Estimated: >60 mL/min (ref >60)
Glucose, Bld: 162 mg/dL — ABNORMAL HIGH (ref 70–99)
Potassium: 3.7 mmol/L (ref 3.5–5.1)
Sodium: 135 mmol/L (ref 135–145)
Total Bilirubin: 1 mg/dL (ref 0.3–1.2)
Total Protein: 7.5 g/dL (ref 6.5–8.1)

## 2020-07-30 LAB — CBC
HCT: 41.8 % (ref 36.0–46.0)
Hemoglobin: 14 g/dL (ref 12.0–15.0)
MCH: 29.7 pg (ref 26.0–34.0)
MCHC: 33.5 g/dL (ref 30.0–36.0)
MCV: 88.6 fL (ref 80.0–100.0)
Platelets: 233 10*3/uL (ref 150–400)
RBC: 4.72 MIL/uL (ref 3.87–5.11)
RDW: 12.9 % (ref 11.5–15.5)
WBC: 11.9 10*3/uL — ABNORMAL HIGH (ref 4.0–10.5)
nRBC: 0 % (ref 0.0–0.2)

## 2020-07-30 LAB — URINALYSIS, COMPLETE (UACMP) WITH MICROSCOPIC
Bacteria, UA: NONE SEEN
Bilirubin Urine: NEGATIVE
Glucose, UA: NEGATIVE mg/dL
Ketones, ur: NEGATIVE mg/dL
Leukocytes,Ua: NEGATIVE
Nitrite: NEGATIVE
Protein, ur: NEGATIVE mg/dL
Specific Gravity, Urine: 1.014 (ref 1.005–1.030)
pH: 5 (ref 5.0–8.0)

## 2020-07-30 LAB — LIPASE, BLOOD: Lipase: 20 U/L (ref 11–51)

## 2020-07-30 MED ORDER — IOHEXOL 300 MG/ML  SOLN
100.0000 mL | Freq: Once | INTRAMUSCULAR | Status: AC | PRN
  Administered 2020-07-30: 100 mL via INTRAVENOUS
  Filled 2020-07-30: qty 100

## 2020-07-30 MED ORDER — AMOXICILLIN-POT CLAVULANATE 875-125 MG PO TABS: 1.0000 | ORAL_TABLET | Freq: Two times a day (BID) | ORAL | 0 refills | Status: AC

## 2020-07-30 MED ORDER — DIAZEPAM 2 MG PO TABS
2.0000 mg | ORAL_TABLET | Freq: Once | ORAL | Status: AC
  Administered 2020-07-30: 2 mg via ORAL
  Filled 2020-07-30: qty 1

## 2020-07-30 NOTE — ED Provider Notes (Signed)
[facility] Emergency Department Provider Note  ____________________________________________   Event Date/Time   First MD Initiated Contact with Patient 07/30/20 1358     (approximate)  I have reviewed the triage vital signs and the nursing notes.   HISTORY  Chief Complaint Abdominal Pain   HPI Joy Vaughan is a 69 y.o. female with past medical history of HTN, GERD, diverticulosis, and OSA as well as approximately 2 to 47-month history of constipation alternating with diarrhea who presents for assessment of some left lower quadrant and right lower quadrant pain.  Patient states her pain began approximately 3 days ago.  She has not had any fevers, chills, headache, earache, sore throat, chest pain, cough, shortness of breath, vomiting, or urinary symptoms.  She states she has not had much of an appetite but does not currently feel nauseous.  She states she felt little constipated today but has had some loose stools the last couple days.  There has not been any blood in her diarrhea.  She does not have any acute extremity pain or rashes.  She states this feels similar to a diverticulitis episode she has had in the past.         Past Medical History:  Diagnosis Date  . Diverticulosis   . GERD (gastroesophageal reflux disease)   . Hypertension   . Sleep apnea     Patient Active Problem List   Diagnosis Date Noted  . Acute diverticulitis 09/26/2018  . GERD (gastroesophageal reflux disease) 09/26/2018  . Sleep apnea 09/26/2018  . HTN (hypertension) 09/26/2018    Past Surgical History:  Procedure Laterality Date  . REPLACEMENT TOTAL KNEE      Prior to Admission medications   Medication Sig Start Date End Date Taking? Authorizing Provider  acetaminophen (TYLENOL) 500 MG tablet Take 500 mg by mouth every 6 (six) hours as needed for mild pain, headache or fever.    [provider]  amoxicillin-clavulanate (AUGMENTIN) 875-125 MG tablet Take 1  tablet by mouth 2 (two) times daily for 10 days. 07/30/20 08/09/20  Gilles Chiquito, MD  BELVIQ 10 MG TABS Take 10 mg by mouth 2 (two) times daily. 08/30/18   [provider]  calcium carbonate (TUMS - DOSED IN MG ELEMENTAL CALCIUM) 500 MG chewable tablet Chew 1 tablet by mouth as needed for indigestion or heartburn.    [provider]  Cholecalciferol (VITAMIN D-1000 MAX ST) 25 MCG (1000 UT) tablet Take 1,000 Units by mouth daily.    [provider]  Inulin 2 g CHEW Chew 2 each by mouth daily before breakfast.    [provider]  losartan-hydrochlorothiazide (HYZAAR) 100-25 MG per tablet Take 1 tablet by mouth daily.    [provider]  PARoxetine (PAXIL) 20 MG tablet Take 20 mg by mouth at bedtime.    [provider]  vancomycin (VANCOCIN) 50 mg/mL oral solution Take 2.5 mLs (125 mg total) by mouth 4 (four) times daily. 09/30/18   Shaune Pollack, MD  Vitamin D, Ergocalciferol, (DRISDOL) 50000 UNITS CAPS capsule Take 50,000 Units by mouth every 7 (seven) days.    [provider]  zolpidem (AMBIEN) 5 MG tablet Take 5 mg by mouth at bedtime as needed for sleep.    [provider]    Allergies Sulfa antibiotics  No family history on file.  Social History Social History   Tobacco Use  . Smoking status: Former Games developer  . Smokeless tobacco: Never Used  Substance Use Topics  .  Alcohol use: No  . Drug use: No    Review of Systems  Review of Systems  Constitutional: Positive for malaise/fatigue. Negative for chills and fever.  HENT: Negative for sore throat.   Eyes: Negative for pain.  Respiratory: Negative for cough and stridor.   Cardiovascular: Negative for chest pain.  Gastrointestinal: Positive for abdominal pain, constipation and diarrhea ( alternating w/ constipation ). Negative for vomiting.  Genitourinary: Negative for dysuria.  Musculoskeletal: Negative for myalgias.  Skin: Negative for rash.  Neurological:  Negative for seizures, loss of consciousness and headaches.  Psychiatric/Behavioral: Negative for suicidal ideas.  All other systems reviewed and are negative.     ____________________________________________   PHYSICAL EXAM:  VITAL SIGNS: ED Triage Vitals  Enc Vitals Group     BP 07/30/20 1013 129/65     Pulse Rate 07/30/20 1013 97     Resp 07/30/20 1013 16     Temp 07/30/20 1013 98 F (36.7 C)     Temp Source 07/30/20 1013 Oral     SpO2 07/30/20 1013 96 %     Weight 07/30/20 1015 205 lb 0.4 oz (93 kg)     Height 07/30/20 1015 5\' 5"  (1.651 m)     Head Circumference --      Peak Flow --      Pain Score 07/30/20 1014 8     Pain Loc --      Pain Edu? --      Excl. in GC? --    Vitals:   07/30/20 1013 07/30/20 1310  BP: 129/65 (!) 117/57  Pulse: 97 74  Resp: 16 16  Temp: 98 F (36.7 C)   SpO2: 96% 96%   Physical Exam Vitals and nursing note reviewed.  Constitutional:      General: She is not in acute distress.    Appearance: She is well-developed and well-nourished.  HENT:     Head: Normocephalic and atraumatic.     Right Ear: External ear normal.     Left Ear: External ear normal.     Nose: Nose normal.  Eyes:     Conjunctiva/sclera: Conjunctivae normal.  Cardiovascular:     Rate and Rhythm: Normal rate and regular rhythm.     Heart sounds: No murmur heard.   Pulmonary:     Effort: Pulmonary effort is normal. No respiratory distress.     Breath sounds: Normal breath sounds.  Abdominal:     Palpations: Abdomen is soft.     Tenderness: There is abdominal tenderness in the right lower quadrant, suprapubic area and left lower quadrant.  Musculoskeletal:        General: No edema.     Cervical back: Neck supple.  Skin:    General: Skin is warm and dry.  Neurological:     Mental Status: She is alert and oriented to person, place, and time.  Psychiatric:        Mood and Affect: Mood and affect and mood normal.       ____________________________________________   LABS (all labs ordered are listed, but only abnormal results are displayed)  Labs Reviewed  COMPREHENSIVE METABOLIC PANEL - Abnormal; Notable for the following components:      Result Value   Glucose, Bld 162 (*)    AST 43 (*)    ALT 60 (*)    All other components within normal limits  CBC - Abnormal; Notable for the following components:   WBC 11.9 (*)    All other components  within normal limits  URINALYSIS, COMPLETE (UACMP) WITH MICROSCOPIC - Abnormal; Notable for the following components:   Color, Urine YELLOW (*)    APPearance CLEAR (*)    Hgb urine dipstick SMALL (*)    All other components within normal limits  LIPASE, BLOOD   ____________________________________________  ____________________________________________  RADIOLOGY  ED MD interpretation: Diverticulitis without evidence of abscess or perforation.  No other acute intra-abdominal pathology.  Official radiology report(s): CT ABDOMEN PELVIS W CONTRAST  Result Date: 07/30/2020 CLINICAL DATA:  69 year old female with abdominal pain. EXAM: CT ABDOMEN AND PELVIS WITH CONTRAST TECHNIQUE: Multidetector CT imaging of the abdomen and pelvis was performed using the standard protocol following bolus administration of intravenous contrast. CONTRAST:  100mL OMNIPAQUE IOHEXOL 300 MG/ML  SOLN COMPARISON:  CT abdomen pelvis dated 09/26/2018. FINDINGS: Lower chest: The visualized lung bases are clear. There is coronary vascular calcification. No intra-abdominal free air or free fluid. Hepatobiliary: There is diffuse fatty infiltration of the liver. No intrahepatic biliary dilatation. A subcentimeter enhancing focus in the dome of the liver (16/3) is not characterized but was present on the prior CT, likely a flash filling hemangioma or portal venous shunting. The gallbladder is unremarkable. Pancreas: Unremarkable. No pancreatic ductal dilatation or surrounding inflammatory changes.  Spleen: 2 splenic hypodense lesions measuring up to 3.5 cm similar to prior CT, likely cysts or hemangioma. Adrenals/Urinary Tract: The adrenal glands unremarkable. The kidneys, visualized ureters, and urinary bladder appear unremarkable. Evaluation of the bladder is limited due to streak artifact caused by bilateral hip arthroplasties. Stomach/Bowel: There are 2 adjacent diverticula in the distal duodenum each measuring approximately 2.5 cm. There is a 2.5 cm diverticula in the second portion of the duodenum. There is severe diffuse colonic diverticulosis. There is active inflammatory changes of the sigmoid diverticula consistent with diverticulitis. No diverticular abscess or perforation. There is no bowel obstruction. The appendix is normal. Vascular/Lymphatic: Mild aortoiliac atherosclerotic disease. The IVC is unremarkable. No portal venous gas. There is no adenopathy. Reproductive: Hysterectomy. No adnexal masses. Other: None Musculoskeletal: Mild degenerative changes of the lower lumbar spine and facet arthropathy. No acute osseous pathology. Bilateral hip arthroplasties. IMPRESSION: 1. Sigmoid diverticulitis. No diverticular abscess or perforation. 2. Fatty liver. 3. Duodenal diverticula. 4. Aortic Atherosclerosis (ICD10-I70.0). Electronically Signed   By: Elgie CollardArash  Radparvar M.D.   On: 07/30/2020 15:16    ____________________________________________   PROCEDURES  Procedure(s) performed (including Critical Care):  Procedures   ____________________________________________   INITIAL IMPRESSION / ASSESSMENT AND PLAN / ED COURSE      Patient presents with us to history exam for some lower abdominal pain that began 3 days ago in the context of several months of alternating diarrhea and constipation.  On arrival patient is afebrile hemodynamically stable.  Differential includes but is not limited to mesenteric ischemia, diverticulitis, SBO, appendicitis, pancreatitis, cholecystitis,  cystitis  Lipase of 20 is not consistent with acute pancreatitis.  CMP has a glucose of 163 and mild transaminitis with no other significant electrolyte metabolic derangements.  No evidence of acidosis.  No evidence of cholestasis.  CBC has a WBC count of 11.9 is otherwise unremarkable.  UA has small hemoglobin and is not suggestive of an infection.  Low suspicion for Pilo given absence of CVA tenderness fever or findings on UA suggestive of urinary source of infection.  CT with evidence of uncomplicated diverticulitis without evidence of abscess or perforation.  No evidence of SBO, pyelonephritis, cholecystitis, pancreatitis or other acute intra-abdominal pathology.  Given stable vital signs with  otherwise reassuring exam and work-up and low suspicion for sepsis or other immediate life-threatening pathology at this time I believe patient safe for discharge with plan for close outpatient PCP follow-up.  Discharged stable condition.  Strict precautions advised and discussed.  Rx written for Augmentin.     ____________________________________________   FINAL CLINICAL IMPRESSION(S) / ED DIAGNOSES  Final diagnoses:  Diverticulitis    Medications  diazepam (VALIUM) tablet 2 mg (2 mg Oral Given 07/30/20 1412)  iohexol (OMNIPAQUE) 300 MG/ML solution 100 mL (100 mLs Intravenous Contrast Given 07/30/20 1454)     ED Discharge Orders         Ordered    amoxicillin-clavulanate (AUGMENTIN) 875-125 MG tablet  2 times daily        07/30/20 1527           Note:  This document was prepared using Dragon voice recognition software and may include unintentional dictation errors.   Gilles Chiquito, MD 07/30/20 [phone]

## 2020-07-30 NOTE — ED Notes (Signed)
See triage note. Pt sitting calmly in bed. Alert. Visitor at bedside. C/o tenderness in LLQ. Denies recent fever. Skin dry. Resp reg/unlabored.

## 2020-07-30 NOTE — ED Triage Notes (Signed)
Recent several month history of loose bowels-- started on probiotics. About 3 days ago, started to have LLQ pain and worsening diarrhea.  AAOx3.  Skin warm and dry. NAD

## 2020-10-24 DIAGNOSIS — G8929 Other chronic pain: Secondary | ICD-10-CM | POA: Insufficient documentation

## 2020-12-19 ENCOUNTER — Telehealth: Payer: Medicare Other | Admitting: Physician Assistant

## 2020-12-19 ENCOUNTER — Encounter: Payer: Self-pay | Admitting: Physician Assistant

## 2020-12-19 DIAGNOSIS — U071 COVID-19: Secondary | ICD-10-CM | POA: Diagnosis not present

## 2020-12-19 MED ORDER — ALBUTEROL SULFATE HFA 108 (90 BASE) MCG/ACT IN AERS: 2.0000 | INHALATION_SPRAY | Freq: Four times a day (QID) | RESPIRATORY_TRACT | 0 refills | Status: DC | PRN

## 2020-12-19 MED ORDER — NIRMATRELVIR/RITONAVIR (PAXLOVID)TABLET: ORAL_TABLET | ORAL | 0 refills | Status: DC

## 2020-12-19 NOTE — Progress Notes (Signed)
Ms. Joy, Vaughan are scheduled for a virtual visit with your provider today.    Just as we do with appointments in the office, we must obtain your consent to participate.  Your consent will be active for this visit and any virtual visit you may have with one of our providers in the next 365 days.    If you have a MyChart account, I can also send a copy of this consent to you electronically.  All virtual visits are billed to your insurance company just like a traditional visit in the office.  As this is a virtual visit, video technology does not allow for your provider to perform a traditional examination.  This may limit your provider's ability to fully assess your condition.  If your provider identifies any concerns that need to be evaluated in person or the need to arrange testing such as labs, EKG, etc, we will make arrangements to do so.    Although advances in technology are sophisticated, we cannot ensure that it will always work on either your end or our end.  If the connection with a video visit is poor, we may have to switch to a telephone visit.  With either a video or telephone visit, we are not always able to ensure that we have a secure connection.   I need to obtain your verbal consent now.   Are you willing to proceed with your visit today?   Joy Vaughan has provided verbal consent on 12/19/2020 for a virtual visit (video or telephone).  Piedad Climes, PA-C 12/19/2020  1:32 PM  Virtual Visit via Video   I connected with patient on 12/19/20 at  1:15 PM EDT by a video enabled telemedicine application and verified that I am speaking with the correct person using two identifiers.  Location patient: Home Location provider: [facility] Persons participating in the virtual visit: Patient, Provider  I discussed the limitations of evaluation and management by telemedicine and the availability of in person appointments. The patient expressed understanding and agreed to  proceed.  Subjective:   HPI:   Patient presents via [facility] today to discuss treatment after testing positive for COVID.  Patient states yesterday she began abruptly with significant fatigue, body ache, headache, now with the development of nasal and head congestion.  Denies fever, chest congestion.  Does note some tightness in her chest with exertion only.  Has a pulse oximeter at home and most oxygen averaging 91 to 94% ambulating.  Notes she just found out her granddaughter tested positive for COVID a few days ago and she has been around regularly.  Took a COVID test yesterday evening which was positive.  Patient with history of hypertension and obstructive sleep apnea.  Also with age greater than 57.  States she has had her COVID vaccines.  ROS:   See pertinent positives and negatives per HPI.  Patient Active Problem List   Diagnosis Date Noted  . Acute diverticulitis 09/26/2018  . GERD (gastroesophageal reflux disease) 09/26/2018  . Sleep apnea 09/26/2018  . HTN (hypertension) 09/26/2018    Social History   Tobacco Use  . Smoking status: Former Games developer  . Smokeless tobacco: Never Used  Substance Use Topics  . Alcohol use: No    Current Outpatient Medications:  .  acetaminophen (TYLENOL) 500 MG tablet, Take 500 mg by mouth every 6 (six) hours as needed for mild pain, headache or fever., Disp: , Rfl:  .  BELVIQ 10 MG TABS, Take 10  mg by mouth 2 (two) times daily., Disp: , Rfl:  .  calcium carbonate (TUMS - DOSED IN MG ELEMENTAL CALCIUM) 500 MG chewable tablet, Chew 1 tablet by mouth as needed for indigestion or heartburn., Disp: , Rfl:  .  Cholecalciferol (VITAMIN D-1000 MAX ST) 25 MCG (1000 UT) tablet, Take 1,000 Units by mouth daily., Disp: , Rfl:  .  Inulin 2 g CHEW, Chew 2 each by mouth daily before breakfast., Disp: , Rfl:  .  losartan-hydrochlorothiazide (HYZAAR) 100-25 MG per tablet, Take 1 tablet by mouth daily., Disp: , Rfl:  .  PARoxetine (PAXIL) 20 MG tablet, Take  20 mg by mouth at bedtime., Disp: , Rfl:  .  vancomycin (VANCOCIN) 50 mg/mL oral solution, Take 2.5 mLs (125 mg total) by mouth 4 (four) times daily., Disp: 90 mL, Rfl: 0 .  Vitamin D, Ergocalciferol, (DRISDOL) 50000 UNITS CAPS capsule, Take 50,000 Units by mouth every 7 (seven) days., Disp: , Rfl:  .  zolpidem (AMBIEN) 5 MG tablet, Take 5 mg by mouth at bedtime as needed for sleep., Disp: , Rfl:   Allergies  Allergen Reactions  . Sulfa Antibiotics Itching and Rash    Objective:   There were no vitals taken for this visit.  Patient is well-developed, well-nourished in no acute distress.  Resting comfortably at home.  Head is normocephalic, atraumatic.  No labored breathing.  Speech is clear and coherent with logical content.  Patient is alert and oriented at baseline.   Assessment and Plan:   1. COVID-19 Positive home test.  Classic symptomology.  Patient is considered higher risk.  As such EMR reviewed with most recent GFR of 71.  She is a candidate for pack Slo-Bid.  Rx sent.  Patient enrolled in COVID monitoring program.  Patient encouraged to hydrate and rest.  We will have her continue to keep an eye on her oxygen levels.  She is aware that if getting less than 90 consistently, she needs to seek emergency room evaluation.  Supportive measures, vitamin regimen and OTC medications reviewed.  She states her primary care provider told her this morning to start Flonase and plain Mucinex.  Agrees she is okay to continue this.  Copy of written instruction was provided to the patient via MyChart. - nirmatrelvir/ritonavir EUA (PAXLOVID) TABS; Patient GFR is 71. Take nirmatrelvir (150 mg) 2 tablet(s) twice daily for 5 days and ritonavir (100 mg) one tablet twice daily for 5 days.  Dispense: 30 tablet; Refill: 0 .   Piedad Climes, PA-C 12/19/2020

## 2020-12-19 NOTE — Patient Instructions (Signed)
Instructions sent to patients MyChart.

## 2020-12-20 ENCOUNTER — Emergency Department
Admission: EM | Admit: 2020-12-20 | Discharge: 2020-12-20 | Disposition: A | Discharge status: Home / Self Care | Payer: Medicare Other | Attending: Emergency Medicine | Admitting: Emergency Medicine

## 2020-12-20 ENCOUNTER — Other Ambulatory Visit: Payer: Self-pay

## 2020-12-20 ENCOUNTER — Emergency Department: Payer: Medicare Other

## 2020-12-20 ENCOUNTER — Encounter: Payer: Self-pay | Admitting: Intensive Care

## 2020-12-20 DIAGNOSIS — U071 COVID-19: Secondary | ICD-10-CM | POA: Diagnosis not present

## 2020-12-20 DIAGNOSIS — Z79899 Other long term (current) drug therapy: Secondary | ICD-10-CM | POA: Insufficient documentation

## 2020-12-20 DIAGNOSIS — R0609 Other forms of dyspnea: Secondary | ICD-10-CM

## 2020-12-20 DIAGNOSIS — Z87891 Personal history of nicotine dependence: Secondary | ICD-10-CM | POA: Diagnosis not present

## 2020-12-20 DIAGNOSIS — I10 Essential (primary) hypertension: Secondary | ICD-10-CM | POA: Diagnosis not present

## 2020-12-20 DIAGNOSIS — R0602 Shortness of breath: Secondary | ICD-10-CM | POA: Diagnosis present

## 2020-12-20 LAB — COMPREHENSIVE METABOLIC PANEL
ALT: 55 U/L — ABNORMAL HIGH (ref 0–44)
AST: 47 U/L — ABNORMAL HIGH (ref 15–41)
Albumin: 4.2 g/dL (ref 3.5–5.0)
Alkaline Phosphatase: 67 U/L (ref 38–126)
Anion gap: 10 (ref 5–15)
BUN: 17 mg/dL (ref 8–23)
CO2: 23 mmol/L (ref 22–32)
Calcium: 8.7 mg/dL — ABNORMAL LOW (ref 8.9–10.3)
Chloride: 105 mmol/L (ref 98–111)
Creatinine, Ser: 0.85 mg/dL (ref 0.44–1.00)
GFR, Estimated: >60 mL/min (ref >60)
Glucose, Bld: 134 mg/dL — ABNORMAL HIGH (ref 70–99)
Potassium: 3.7 mmol/L (ref 3.5–5.1)
Sodium: 138 mmol/L (ref 135–145)
Total Bilirubin: 0.5 mg/dL (ref 0.3–1.2)
Total Protein: 7.5 g/dL (ref 6.5–8.1)

## 2020-12-20 LAB — CBC WITH DIFFERENTIAL/PLATELET
Abs Immature Granulocytes: 0.04 10*3/uL (ref 0.00–0.07)
Basophils Absolute: 0.1 10*3/uL (ref 0.0–0.1)
Basophils Relative: 2 %
Eosinophils Absolute: 0.1 10*3/uL (ref 0.0–0.5)
Eosinophils Relative: 2 %
HCT: 44.5 % (ref 36.0–46.0)
Hemoglobin: 15.1 g/dL — ABNORMAL HIGH (ref 12.0–15.0)
Immature Granulocytes: 1 %
Lymphocytes Relative: 33 %
Lymphs Abs: 1.6 10*3/uL (ref 0.7–4.0)
MCH: 30.1 pg (ref 26.0–34.0)
MCHC: 33.9 g/dL (ref 30.0–36.0)
MCV: 88.6 fL (ref 80.0–100.0)
Monocytes Absolute: 0.5 10*3/uL (ref 0.1–1.0)
Monocytes Relative: 10 %
Neutro Abs: 2.5 10*3/uL (ref 1.7–7.7)
Neutrophils Relative %: 52 %
Platelets: 232 10*3/uL (ref 150–400)
RBC: 5.02 MIL/uL (ref 3.87–5.11)
RDW: 13.2 % (ref 11.5–15.5)
WBC: 4.7 10*3/uL (ref 4.0–10.5)
nRBC: 0 % (ref 0.0–0.2)

## 2020-12-20 NOTE — ED Notes (Signed)
Ambulated patient on pulse ox, sats remained in mid to high 90's. Patient was [facility] but tolerated well and able to catch breath once sitting.

## 2020-12-20 NOTE — ED Triage Notes (Signed)
Pt tested covid pos two days ago-symptoms also started then. Has home pulse oxy and reports numbers dropping in the 80s. Pt took 2 puffs of inhaler before coming. Sats at 95% at this time RA.

## 2020-12-20 NOTE — ED Provider Notes (Signed)
[facility] Emergency Department Provider Note   ____________________________________________   Event Date/Time   First MD Initiated Contact with Patient 12/20/20 1531     (approximate)  I have reviewed the triage vital signs and the nursing notes.   HISTORY  Chief Complaint Shortness of Breath    HPI Joy Vaughan is a 70 y.o. female with past medical history of hypertension and GERD who presents to the ED complaining of shortness of breath.  Patient reports that she took a home test for COVID-19 2 days ago that came back positive.  Prior to that she had been dealing with fevers, cough, generalized weakness, and malaise for a couple of days.  She denies any pain in her chest and does not have significant difficulty breathing at rest.  She does have worsening difficulty breathing with exertion.  She was prescribed albuterol as well as Paxil bid by her PCP, has been taking these consistently.  She has been checking her blood oxygen at home and noted that it was in the mid 80s today at a couple of points.  She was not exerting herself at the time of checking her O2 saturation.        Past Medical History:  Diagnosis Date  . Diverticulosis   . GERD (gastroesophageal reflux disease)   . Hypertension   . Sleep apnea     Patient Active Problem List   Diagnosis Date Noted  . Acute diverticulitis 09/26/2018  . GERD (gastroesophageal reflux disease) 09/26/2018  . Sleep apnea 09/26/2018  . HTN (hypertension) 09/26/2018    Past Surgical History:  Procedure Laterality Date  . REPLACEMENT TOTAL KNEE      Prior to Admission medications   Medication Sig Start Date End Date Taking? Authorizing Provider  acetaminophen (TYLENOL) 500 MG tablet Take 500 mg by mouth every 6 (six) hours as needed for mild pain, headache or fever.    [provider]  albuterol (VENTOLIN HFA) 108 (90 Base) MCG/ACT inhaler Inhale 2 puffs into the lungs every 6 (six) hours as  needed for wheezing or shortness of breath. 12/19/20   Waldon Merl, PA-C  Cholecalciferol (VITAMIN D-1000 MAX ST) 25 MCG (1000 UT) tablet Take 1,000 Units by mouth daily.    [provider]  losartan-hydrochlorothiazide (HYZAAR) 100-25 MG per tablet Take 1 tablet by mouth daily.    [provider]  nirmatrelvir/ritonavir EUA (PAXLOVID) TABS Patient GFR is 71. Take nirmatrelvir (150 mg) 2 tablet(s) twice daily for 5 days and ritonavir (100 mg) one tablet twice daily for 5 days. 12/19/20   Waldon Merl, PA-C  PARoxetine (PAXIL) 20 MG tablet Take 20 mg by mouth at bedtime.    [provider]  zolpidem (AMBIEN) 5 MG tablet Take 5 mg by mouth at bedtime as needed for sleep.    [provider]    Allergies Sulfa antibiotics  History reviewed. No pertinent family history.  Social History Social History   Tobacco Use  . Smoking status: Former Games developer  . Smokeless tobacco: Never Used  Substance Use Topics  . Alcohol use: No  . Drug use: No    Review of Systems  Constitutional: Positive for fever/chills Eyes: No visual changes. ENT: No sore throat. Cardiovascular: Denies chest pain. Respiratory: Positive for shortness of breath.  Positive for cough. Gastrointestinal: No abdominal pain.  No nausea, no vomiting.  No diarrhea.  No constipation. Genitourinary: Negative for dysuria. Musculoskeletal: Negative for back pain. Skin: Negative for rash. Neurological:  Negative for headaches, focal weakness or numbness.  ____________________________________________   PHYSICAL EXAM:  VITAL SIGNS: ED Triage Vitals  Enc Vitals Group     BP 12/20/20 1150 121/60     Pulse Rate 12/20/20 1138 99     Resp 12/20/20 1138 (!) 22     Temp 12/20/20 1150 98.1 F (36.7 C)     Temp Source 12/20/20 1150 Oral     SpO2 12/20/20 1138 95 %     Weight 12/20/20 1153 220 lb (99.8 kg)     Height 12/20/20 1153 5\' 5"  (1.651 m)     Head Circumference --      Peak Flow --       Pain Score 12/20/20 1152 4     Pain Loc --      Pain Edu? --      Excl. in GC? --     Constitutional: Alert and oriented. Eyes: Conjunctivae are normal. Head: Atraumatic. Nose: No congestion/rhinnorhea. Mouth/Throat: Mucous membranes are moist. Neck: Normal ROM Cardiovascular: Normal rate, regular rhythm. Grossly normal heart sounds.  2+ radial pulses bilaterally. Respiratory: Normal respiratory effort.  No retractions. Lungs CTAB. Gastrointestinal: Soft and nontender. No distention. Genitourinary: deferred Musculoskeletal: No lower extremity tenderness nor edema. Neurologic:  Normal speech and language. No gross focal neurologic deficits are appreciated. Skin:  Skin is warm, dry and intact. No rash noted. Psychiatric: Mood and affect are normal. Speech and behavior are normal.  ____________________________________________   LABS (all labs ordered are listed, but only abnormal results are displayed)  Labs Reviewed  CBC WITH DIFFERENTIAL/PLATELET - Abnormal; Notable for the following components:      Result Value   Hemoglobin 15.1 (*)    All other components within normal limits  COMPREHENSIVE METABOLIC PANEL - Abnormal; Notable for the following components:   Glucose, Bld 134 (*)    Calcium 8.7 (*)    AST 47 (*)    ALT 55 (*)    All other components within normal limits   ____________________________________________  EKG  ED ECG REPORT I, 02/19/21, the attending physician, personally viewed and interpreted this ECG.   Date: 12/20/2020  EKG Time: 12:01  Rate: 88  Rhythm: normal sinus rhythm  Axis: Normal  Intervals:none  ST&T Change: None   PROCEDURES  Procedure(s) performed (including Critical Care):  Procedures   ____________________________________________   INITIAL IMPRESSION / ASSESSMENT AND PLAN / ED COURSE       70 year old female with past medical history of hypertension and GERD who presents to the ED complaining of worsening  difficulty breathing and low O2 sats at home after testing positive for COVID-19 2 days ago.  On my assessment, patient is not in any respiratory distress and is maintaining O2 sats on room air.  She was ambulated on a pulse ox and continues to maintain O2 sats.  EKG shows no evidence of arrhythmia or ischemia, chest x-ray reviewed by me and shows no infiltrate, edema, or effusion.  Labs are also unremarkable.  Given reassuring work-up, patient is appropriate for discharge home with ongoing supportive care including albuterol and Paxil of it.  She was counseled to follow-up with her PCP and to return to the ED for new worsening symptoms, patient agrees with plan.      ____________________________________________   FINAL CLINICAL IMPRESSION(S) / ED DIAGNOSES  Final diagnoses:  COVID-19  Dyspnea on exertion     ED Discharge Orders    None       Note:  This  document was prepared using Conservation officer, historic buildings and may include unintentional dictation errors.   Chesley Noon, MD 12/20/20 1600

## 2021-02-25 DIAGNOSIS — M85832 Other specified disorders of bone density and structure, left forearm: Secondary | ICD-10-CM | POA: Insufficient documentation

## 2021-04-30 ENCOUNTER — Encounter: Payer: Self-pay | Admitting: Emergency Medicine

## 2021-04-30 ENCOUNTER — Other Ambulatory Visit: Payer: Self-pay

## 2021-04-30 ENCOUNTER — Emergency Department
Admission: EM | Admit: 2021-04-30 | Discharge: 2021-04-30 | Disposition: A | Discharge status: Home / Self Care | Payer: Medicare Other | Attending: Emergency Medicine | Admitting: Emergency Medicine

## 2021-04-30 DIAGNOSIS — Z87891 Personal history of nicotine dependence: Secondary | ICD-10-CM | POA: Insufficient documentation

## 2021-04-30 DIAGNOSIS — K5900 Constipation, unspecified: Secondary | ICD-10-CM | POA: Insufficient documentation

## 2021-04-30 DIAGNOSIS — Z79899 Other long term (current) drug therapy: Secondary | ICD-10-CM | POA: Insufficient documentation

## 2021-04-30 DIAGNOSIS — Z8719 Personal history of other diseases of the digestive system: Secondary | ICD-10-CM

## 2021-04-30 DIAGNOSIS — R1032 Left lower quadrant pain: Secondary | ICD-10-CM

## 2021-04-30 DIAGNOSIS — I10 Essential (primary) hypertension: Secondary | ICD-10-CM | POA: Insufficient documentation

## 2021-04-30 LAB — COMPREHENSIVE METABOLIC PANEL
ALT: 28 U/L (ref 0–44)
AST: 25 U/L (ref 15–41)
Albumin: 4.3 g/dL (ref 3.5–5.0)
Alkaline Phosphatase: 75 U/L (ref 38–126)
Anion gap: 10 (ref 5–15)
BUN: 22 mg/dL (ref 8–23)
CO2: 27 mmol/L (ref 22–32)
Calcium: 9.1 mg/dL (ref 8.9–10.3)
Chloride: 102 mmol/L (ref 98–111)
Creatinine, Ser: 0.72 mg/dL (ref 0.44–1.00)
GFR, Estimated: >60 mL/min (ref >60)
Glucose, Bld: 132 mg/dL — ABNORMAL HIGH (ref 70–99)
Potassium: 3.9 mmol/L (ref 3.5–5.1)
Sodium: 139 mmol/L (ref 135–145)
Total Bilirubin: 0.7 mg/dL (ref 0.3–1.2)
Total Protein: 7.5 g/dL (ref 6.5–8.1)

## 2021-04-30 LAB — CBC
HCT: 42.1 % (ref 36.0–46.0)
Hemoglobin: 14.7 g/dL (ref 12.0–15.0)
MCH: 31.6 pg (ref 26.0–34.0)
MCHC: 34.9 g/dL (ref 30.0–36.0)
MCV: 90.5 fL (ref 80.0–100.0)
Platelets: 233 10*3/uL (ref 150–400)
RBC: 4.65 MIL/uL (ref 3.87–5.11)
RDW: 13 % (ref 11.5–15.5)
WBC: 10 10*3/uL (ref 4.0–10.5)
nRBC: 0 % (ref 0.0–0.2)

## 2021-04-30 LAB — URINALYSIS, COMPLETE (UACMP) WITH MICROSCOPIC
Bacteria, UA: NONE SEEN
Bilirubin Urine: NEGATIVE
Glucose, UA: NEGATIVE mg/dL
Ketones, ur: NEGATIVE mg/dL
Leukocytes,Ua: NEGATIVE
Nitrite: NEGATIVE
Protein, ur: NEGATIVE mg/dL
Specific Gravity, Urine: 1.021 (ref 1.005–1.030)
pH: 5 (ref 5.0–8.0)

## 2021-04-30 LAB — LIPASE, BLOOD: Lipase: 26 U/L (ref 11–51)

## 2021-04-30 MED ORDER — CIPROFLOXACIN HCL 500 MG PO TABS: 500.0000 mg | ORAL_TABLET | Freq: Two times a day (BID) | ORAL | 0 refills | Status: AC

## 2021-04-30 MED ORDER — METRONIDAZOLE 500 MG PO TABS: 500.0000 mg | ORAL_TABLET | Freq: Three times a day (TID) | ORAL | 0 refills | Status: AC

## 2021-04-30 NOTE — ED Notes (Signed)
See triage note.  Presents with LLQ pain since last pm  states sx's feel like her diverticulitis  denies any n/v/d/ or fever

## 2021-04-30 NOTE — ED Provider Notes (Signed)
[facility] Emergency Department Provider Note   ____________________________________________   Event Date/Time   First MD Initiated Contact with Patient 04/30/21 1036     (approximate)  I have reviewed the triage vital signs and the nursing notes.   HISTORY  Chief Complaint Abdominal Pain    HPI Joy Vaughan is a 70 y.o. female who presents for left lower quadrant abdominal pain  LOCATION: Left lower quadrant abdomen DURATION: Began last night TIMING: Worsening since onset SEVERITY: 7/10 QUALITY: Aching pain CONTEXT: Patient states she began having left lower quadrant abdominal pain with associated's constipation that began last night and states is similar to previous diverticulitis pain she has had in the past MODIFYING FACTORS: Denies any exacerbating or relieving factors however she does state the palpation of the left lower quadrant slightly worsens this pain. ASSOCIATED SYMPTOMS: Constipation   Per medical record review patient has history of multiple episodes of acute diverticulitis          Past Medical History:  Diagnosis Date   Diverticulosis    GERD (gastroesophageal reflux disease)    Hypertension    Sleep apnea     Patient Active Problem List   Diagnosis Date Noted   Acute diverticulitis 09/26/2018   GERD (gastroesophageal reflux disease) 09/26/2018   Sleep apnea 09/26/2018   HTN (hypertension) 09/26/2018    Past Surgical History:  Procedure Laterality Date   REPLACEMENT TOTAL KNEE      Prior to Admission medications   Medication Sig Start Date End Date Taking? Authorizing Provider  ciprofloxacin (CIPRO) 500 MG tablet Take 1 tablet (500 mg total) by mouth 2 (two) times daily for 7 days. 04/30/21 05/07/21 Yes , Clent Jacks, MD  metroNIDAZOLE (FLAGYL) 500 MG tablet Take 1 tablet (500 mg total) by mouth 3 (three) times daily for 7 days. 04/30/21 05/07/21 Yes Merwyn Katos, MD  acetaminophen (TYLENOL) 500 MG tablet Take  500 mg by mouth every 6 (six) hours as needed for mild pain, headache or fever.    [provider]  albuterol (VENTOLIN HFA) 108 (90 Base) MCG/ACT inhaler Inhale 2 puffs into the lungs every 6 (six) hours as needed for wheezing or shortness of breath. 12/19/20   Waldon Merl, PA-C  Cholecalciferol (VITAMIN D-1000 MAX ST) 25 MCG (1000 UT) tablet Take 1,000 Units by mouth daily.    [provider]  losartan-hydrochlorothiazide (HYZAAR) 100-25 MG per tablet Take 1 tablet by mouth daily.    [provider]  nirmatrelvir/ritonavir EUA (PAXLOVID) TABS Patient GFR is 71. Take nirmatrelvir (150 mg) 2 tablet(s) twice daily for 5 days and ritonavir (100 mg) one tablet twice daily for 5 days. 12/19/20   Waldon Merl, PA-C  PARoxetine (PAXIL) 20 MG tablet Take 20 mg by mouth at bedtime.    [provider]  zolpidem (AMBIEN) 5 MG tablet Take 5 mg by mouth at bedtime as needed for sleep.    [provider]    Allergies Sulfa antibiotics  History reviewed. No pertinent family history.  Social History Social History   Tobacco Use   Smoking status: Former   Smokeless tobacco: Never  Substance Use Topics   Alcohol use: No   Drug use: No    Review of Systems Constitutional: No fever/chills Eyes: No visual changes. ENT: No sore throat. Cardiovascular: Denies chest pain. Respiratory: Denies shortness of breath. Gastrointestinal: Endorses left lower quadrant abdominal pain.  No nausea, no vomiting.  No diarrhea.  Endorses constipation Genitourinary: Negative  for dysuria. Musculoskeletal: Negative for acute arthralgias Skin: Negative for rash. Neurological: Negative for headaches, weakness/numbness/paresthesias in any extremity Psychiatric: Negative for suicidal ideation/homicidal ideation   ____________________________________________   PHYSICAL EXAM:  VITAL SIGNS: ED Triage Vitals  Enc Vitals Group     BP 04/30/21 0800 (!) 153/73     Pulse  Rate 04/30/21 0800 73     Resp 04/30/21 0800 19     Temp 04/30/21 0800 97.8 F (36.6 C)     Temp Source 04/30/21 0800 Oral     SpO2 04/30/21 0800 96 %     Weight 04/30/21 0755 220 lb 0.3 oz (99.8 kg)     Height 04/30/21 0755 5\' 5"  (1.651 m)     Head Circumference --      Peak Flow --      Pain Score 04/30/21 0755 8     Pain Loc --      Pain Edu? --      Excl. in GC? --    Constitutional: Alert and oriented. Well appearing and in no acute distress. Eyes: Conjunctivae are normal. PERRL. Head: Atraumatic. Nose: No congestion/rhinnorhea. Mouth/Throat: Mucous membranes are moist. Neck: No stridor Cardiovascular: Grossly normal heart sounds.  Good peripheral circulation. Respiratory: Normal respiratory effort.  No retractions. Gastrointestinal: Soft and mild tenderness palpation left lower quadrant. No distention. Musculoskeletal: No obvious deformities Neurologic:  Normal speech and language. No gross focal neurologic deficits are appreciated. Skin:  Skin is warm and dry. No rash noted. Psychiatric: Mood and affect are normal. Speech and behavior are normal.  ____________________________________________   LABS (all labs ordered are listed, but only abnormal results are displayed)  Labs Reviewed  COMPREHENSIVE METABOLIC PANEL - Abnormal; Notable for the following components:      Result Value   Glucose, Bld 132 (*)    All other components within normal limits  URINALYSIS, COMPLETE (UACMP) WITH MICROSCOPIC - Abnormal; Notable for the following components:   Color, Urine YELLOW (*)    APPearance HAZY (*)    Hgb urine dipstick SMALL (*)    All other components within normal limits  LIPASE, BLOOD  CBC   PROCEDURES  Procedure(s) performed (including Critical Care):  .1-3 Lead EKG Interpretation Performed by: 05/02/21, MD Authorized by: Merwyn Katos, MD     Interpretation: normal     ECG rate:  72   ECG rate assessment: normal     Rhythm: sinus rhythm      Ectopy: none     Conduction: normal     ____________________________________________   INITIAL IMPRESSION / ASSESSMENT AND PLAN / ED COURSE  As part of my medical decision making, I reviewed the following data within the electronic medical record, if available:  Nursing notes reviewed and incorporated, Labs reviewed, EKG interpreted, Old chart reviewed, Radiograph reviewed and Notes from prior ED visits reviewed and incorporated     Patient has presumed diverticulitis that is amenable to oral antibiotics. Patient has no peritoneal signs or signs of perforation. Patients symptoms not typical for other emergent causes of abdominal pain such as, but not limited to, appendicitis, abdominal aortic aneurysm, surgical biliary disease, acute coronary syndrome, etc.  Patient will be discharged with strict return precautions and follow up with primary MD within 12-24 hours for further evaluation.  Patient understands that they may require admission and IV antibiotics and possibly surgery if they do not improve with oral antibiotics.      ____________________________________________   FINAL CLINICAL IMPRESSION(S) / ED  DIAGNOSES  Final diagnoses:  Left lower quadrant abdominal pain  Constipation, unspecified constipation type  History of colonic diverticulitis     ED Discharge Orders          Ordered    ciprofloxacin (CIPRO) 500 MG tablet  2 times daily        04/30/21 1054    metroNIDAZOLE (FLAGYL) 500 MG tablet  3 times daily        04/30/21 1054             Note:  This document was prepared using Dragon voice recognition software and may include unintentional dictation errors.    Merwyn Katos, MD 04/30/21 1059

## 2021-04-30 NOTE — ED Triage Notes (Signed)
Pt comes into the ED via POV c/o lower abd pain.  Pt denies any N/V/D but states she has some constipation.  Pt has h/o diverticulitis and states this feels the same.  Pt ambulatory to triage at this time.

## 2021-05-26 NOTE — Progress Notes (Signed)
05/27/21 3:01 PM   Sheela Stack October 22, 1950 [id number]  Referring provider:  Marisue Ivan, MD [phone] [facility] [address] [facility] [place],  [place] [address] Chief Complaint  Patient presents with   Urinary Frequency     HPI: Joy Vaughan is a 70 y.o.female who presents today for further evaluation of urinary symptoms.   She has a history of urinary incontinence and was referred by her PCP. She reported that she was previously seen in urology and that her bladder is "dropping".   She reports today that she underwent urodynamics in [place] due to urinary leakage.  This was many years ago.  She does not remember the provider.  She does remember if she was treated with medications or what was recommended at the time.  She did not follow-up.  She reports that she takes a water pill now that increases her urinary urgency, frequency, and leakage. She believes her bladder has dropped because when she is active she has to urinate.   She denies any vaginal bulging, pressure, or bulging during wiping.   She reports that she took medicine for her urinary symptoms before that caused dry mouth.   She reports leakage while laughing, coughing, and sneezing. She reports that it is a large volume when she leaks. She also experiences leakage during the nighttime.   PMH: Past Medical History:  Diagnosis Date   Diverticulosis    GERD (gastroesophageal reflux disease)    Hypertension    Sleep apnea     Surgical History: Past Surgical History:  Procedure Laterality Date   REPLACEMENT TOTAL KNEE      Home Medications:  Allergies as of 05/27/2021       Reactions   Ezetimibe    Other reaction(s): Muscle Pain   Statins    Other reaction(s): Muscle Pain   Sulfa Antibiotics Itching, Rash        Medication List        Accurate as of May 27, 2021  3:01 PM. If you have any questions, ask your nurse or doctor.          STOP taking these medications    albuterol  108 (90 Base) MCG/ACT inhaler Commonly known as: VENTOLIN HFA Stopped by: Vanna Scotland, MD   nirmatrelvir/ritonavir EUA 20 x 150 MG & 10 x 100MG  Tabs Commonly known as: PAXLOVID Stopped by: , MD   PARoxetine 20 MG tablet Commonly known as: PAXIL Stopped by: Vanna Scotland, MD       TAKE these medications    acetaminophen 500 MG tablet Commonly known as: TYLENOL Take 500 mg by mouth every 6 (six) hours as needed for mild pain, headache or fever.   Calcium Carb-Cholecalciferol 600-400 MG-UNIT Tabs Take by mouth.   Cholecalciferol 25 MCG (1000 UT) tablet Take 1,000 Units by mouth daily.   DULoxetine 30 MG capsule Commonly known as: CYMBALTA Take 30 mg by mouth daily.   Gemtesa 75 MG Tabs Generic drug: Vibegron Take 75 mg by mouth daily at 6 (six) AM. Started by: Vanna Scotland, MD   losartan-hydrochlorothiazide 100-25 MG tablet Commonly known as: HYZAAR Take 1 tablet by mouth daily.   zolpidem 5 MG tablet Commonly known as: AMBIEN Take 5 mg by mouth at bedtime as needed for sleep.        Allergies:  Allergies  Allergen Reactions   Ezetimibe     Other reaction(s): Muscle Pain   Statins     Other reaction(s): Muscle Pain  Sulfa Antibiotics Itching and Rash    Family History: Family History  Problem Relation Age of Onset   Prostate cancer Neg Hx    Bladder Cancer Neg Hx    Testicular cancer Neg Hx    Kidney cancer Neg Hx     Social History:  reports that she has quit smoking. She has never used smokeless tobacco. She reports that she does not drink alcohol and does not use drugs.   Physical Exam: BP 122/78   Pulse 67   Ht 5\' 5"  (1.651 m)   Wt 210 lb (95.3 kg)   BMI 34.95 kg/m   Constitutional:  Alert and oriented, No acute distress. HEENT: [place] AT, moist mucus membranes.  Trachea midline, no masses. Cardiovascular: No clubbing, cyanosis, or edema. Respiratory: Normal respiratory effort, no increased work of breathing. Skin: No  rashes, bruises or suspicious lesions. Neurologic: Grossly intact, no focal deficits, moving all 4 extremities. Psychiatric: Normal mood and affect.  Laboratory Data:  Lab Results  Component Value Date   CREATININE 0.72 04/30/2021    Urinalysis Negative   Pertinent Imaging: Results for orders placed or performed in visit on 05/27/21  BLADDER SCAN AMB NON-IMAGING  Result Value Ref Range   Scan Result 31 ml      Assessment & Plan:   OAB  -Based on her symptoms, she probably has urgency, frequency and urge incontinence is probably unrelated to the position of her bladder -She has no vaginal symptoms and thus if she does have prolapse -She does have some mild stress incontinence but seems to be primarily bothered by her urge incontinence, will treat her urge incontinence aggressively -She is currently on no medications, she was given Gemtesa 75 mg x 1 month samples to try.  She was advised to contact me on MyChart to let me know how she fares with this medication.  She is doing well, will continue this prescription and if not, we will try various additional treatment options -In the interim, we also discussed behavioral modification and avoidance of irritating beverages and behavioral changes which may improve her urinary symptoms - Release signed to gain records from [facility]     I,Kailey Littlejohn,acting as a scribe for 07/27/21, MD.,have documented all relevant documentation on the behalf of Vanna Scotland, MD,as directed by  Vanna Scotland, MD while in the presence of Vanna Scotland, MD.  I have reviewed the above documentation for accuracy and completeness, and I agree with the above.   Vanna Scotland, MD   [facility] [address], [address] [place], [place] [phone]

## 2021-05-27 ENCOUNTER — Ambulatory Visit (INDEPENDENT_AMBULATORY_CARE_PROVIDER_SITE_OTHER): Payer: Medicare Other | Admitting: Urology

## 2021-05-27 ENCOUNTER — Encounter: Payer: Self-pay | Admitting: Urology

## 2021-05-27 ENCOUNTER — Other Ambulatory Visit: Payer: Self-pay

## 2021-05-27 VITALS — BP 122/78 | HR 67 | Ht 65.0 in | Wt 210.0 lb

## 2021-05-27 DIAGNOSIS — R350 Frequency of micturition: Secondary | ICD-10-CM

## 2021-05-27 LAB — BLADDER SCAN AMB NON-IMAGING: Scan Result: 31

## 2021-05-27 MED ORDER — GEMTESA 75 MG PO TABS: 75.0000 mg | ORAL_TABLET | Freq: Every day | ORAL | 0 refills | Status: DC

## 2021-06-03 LAB — URINALYSIS, COMPLETE
Bilirubin, UA: NEGATIVE
Glucose, UA: NEGATIVE
Ketones, UA: NEGATIVE
Leukocytes,UA: NEGATIVE
Nitrite, UA: NEGATIVE
Protein,UA: NEGATIVE
Specific Gravity, UA: 1.02 (ref 1.005–1.030)
Urobilinogen, Ur: 0.2 mg/dL (ref 0.2–1.0)
pH, UA: 6.5 (ref 5.0–7.5)

## 2021-06-03 LAB — MICROSCOPIC EXAMINATION
Bacteria, UA: NONE SEEN
WBC, UA: NONE SEEN /hpf (ref 0–5)

## 2021-06-25 ENCOUNTER — Telehealth: Payer: Self-pay | Admitting: Urology

## 2021-06-25 MED ORDER — GEMTESA 75 MG PO TABS: 75.0000 mg | ORAL_TABLET | Freq: Every day | ORAL | 0 refills | Status: AC

## 2021-06-25 NOTE — Telephone Encounter (Signed)
Patient stated she was given a sample of Gentesa at her last visit.  She stated that the medicine worked great and she would like for a rx to be sent in for a 90 day supply to [facility] at [address] in [place]

## 2021-06-25 NOTE — Telephone Encounter (Signed)
Medication refilled

## 2021-06-26 NOTE — Telephone Encounter (Signed)
PA pending on cover my meds Key: [id number]

## 2021-06-29 ENCOUNTER — Telehealth: Payer: Self-pay | Admitting: Urology

## 2021-06-29 NOTE — Telephone Encounter (Signed)
Pt called in stating she is out of her Gemtesa. She said she we need to send in a prior auth for her to get this medication. I saw in the notes where it is pending and I told her that, but she said it's pending waiting on [place]. She would like a call back to know what is going on.

## 2021-07-01 NOTE — Telephone Encounter (Signed)
Spoke with patient, [facility] was out of stock of [place]. Patient was contacted from [employer] was in stock today and picked up RX.

## 2021-07-07 ENCOUNTER — Ambulatory Visit: Payer: Medicare Other | Admitting: Urology

## 2021-10-21 DIAGNOSIS — Z9889 Other specified postprocedural states: Secondary | ICD-10-CM | POA: Insufficient documentation

## 2021-10-21 DIAGNOSIS — I420 Dilated cardiomyopathy: Secondary | ICD-10-CM | POA: Diagnosis present

## 2021-11-18 ENCOUNTER — Telehealth: Payer: Self-pay | Admitting: Psychiatry

## 2021-11-18 NOTE — Telephone Encounter (Signed)
Patient left voicemail to cancel new patient appointment scheduled for 12/17/2021. Called her back to confirm.She stated she had just changed her mind about the appointment. ?

## 2021-12-17 ENCOUNTER — Ambulatory Visit: Payer: Self-pay | Admitting: Psychiatry

## 2021-12-25 ENCOUNTER — Emergency Department: Payer: Medicare Other

## 2021-12-25 ENCOUNTER — Emergency Department
Admission: EM | Admit: 2021-12-25 | Discharge: 2021-12-25 | Disposition: A | Discharge status: Home / Self Care | Payer: Medicare Other | Attending: Emergency Medicine | Admitting: Emergency Medicine

## 2021-12-25 ENCOUNTER — Other Ambulatory Visit: Payer: Self-pay

## 2021-12-25 DIAGNOSIS — K5792 Diverticulitis of intestine, part unspecified, without perforation or abscess without bleeding: Secondary | ICD-10-CM | POA: Insufficient documentation

## 2021-12-25 DIAGNOSIS — I10 Essential (primary) hypertension: Secondary | ICD-10-CM | POA: Insufficient documentation

## 2021-12-25 DIAGNOSIS — R1031 Right lower quadrant pain: Secondary | ICD-10-CM | POA: Diagnosis present

## 2021-12-25 LAB — URINALYSIS, ROUTINE W REFLEX MICROSCOPIC
Bilirubin Urine: NEGATIVE
Glucose, UA: NEGATIVE mg/dL
Hgb urine dipstick: NEGATIVE
Ketones, ur: NEGATIVE mg/dL
Leukocytes,Ua: NEGATIVE
Nitrite: NEGATIVE
Protein, ur: NEGATIVE mg/dL
Specific Gravity, Urine: 1.023 (ref 1.005–1.030)
pH: 5 (ref 5.0–8.0)

## 2021-12-25 LAB — CBC
HCT: 41.7 % (ref 36.0–46.0)
Hemoglobin: 13.5 g/dL (ref 12.0–15.0)
MCH: 28.8 pg (ref 26.0–34.0)
MCHC: 32.4 g/dL (ref 30.0–36.0)
MCV: 89.1 fL (ref 80.0–100.0)
Platelets: 243 10*3/uL (ref 150–400)
RBC: 4.68 MIL/uL (ref 3.87–5.11)
RDW: 13.6 % (ref 11.5–15.5)
WBC: 8.3 10*3/uL (ref 4.0–10.5)
nRBC: 0 % (ref 0.0–0.2)

## 2021-12-25 LAB — COMPREHENSIVE METABOLIC PANEL
ALT: 16 U/L (ref 0–44)
AST: 14 U/L — ABNORMAL LOW (ref 15–41)
Albumin: 4.3 g/dL (ref 3.5–5.0)
Alkaline Phosphatase: 71 U/L (ref 38–126)
Anion gap: 7 (ref 5–15)
BUN: 42 mg/dL — ABNORMAL HIGH (ref 8–23)
CO2: 26 mmol/L (ref 22–32)
Calcium: 9.3 mg/dL (ref 8.9–10.3)
Chloride: 106 mmol/L (ref 98–111)
Creatinine, Ser: 0.77 mg/dL (ref 0.44–1.00)
GFR, Estimated: >60 mL/min (ref >60)
Glucose, Bld: 117 mg/dL — ABNORMAL HIGH (ref 70–99)
Potassium: 4.3 mmol/L (ref 3.5–5.1)
Sodium: 139 mmol/L (ref 135–145)
Total Bilirubin: 0.3 mg/dL (ref 0.3–1.2)
Total Protein: 7.4 g/dL (ref 6.5–8.1)

## 2021-12-25 LAB — LIPASE, BLOOD: Lipase: 36 U/L (ref 11–51)

## 2021-12-25 MED ORDER — LORAZEPAM 2 MG/ML IJ SOLN
0.5000 mg | Freq: Once | INTRAMUSCULAR | Status: AC
  Administered 2021-12-25: 0.5 mg via INTRAVENOUS
  Filled 2021-12-25: qty 1

## 2021-12-25 MED ORDER — IOHEXOL 300 MG/ML  SOLN
100.0000 mL | Freq: Once | INTRAMUSCULAR | Status: AC | PRN
  Administered 2021-12-25: 100 mL via INTRAVENOUS

## 2021-12-25 MED ORDER — AMOXICILLIN-POT CLAVULANATE 875-125 MG PO TABS: 1.0000 | ORAL_TABLET | Freq: Two times a day (BID) | ORAL | 0 refills | Status: AC

## 2021-12-25 NOTE — ED Triage Notes (Signed)
Ambulatory to triage with c/o abd pain apx 9pm tonight. Hx of diverticulitis. C/o RLQ abd pain. Denies nausea/vomiting/diarrhea.  ?

## 2021-12-25 NOTE — ED Provider Notes (Signed)
? ?[facility] ?Provider Note ? ? ? Event Date/Time  ? First MD Initiated Contact with Patient 12/25/21 0158   ?  (approximate) ? ? ?History  ? ?Abdominal Pain ? ? ?HPI ? ?Joy Vaughan is a 71 y.o. female who presents to the ED for evaluation of Abdominal Pain ?  ?I reviewed PCP visit from 3/8.  Obese patient with history of depression, GERD, HTN ?I reviewed outpatient GI visit from August.  Had EGD/colonoscopy last summer.  History diverticulosis. ? ?Patient presents to the ED for evaluation of acute RLQ pain over the past couple hours explicit concern for diverticulitis with perforation.  Denies any systemic symptoms such as fevers, nausea.  Denies any emesis or bloody stools.  Reports having some loose stools last night before going to bed.  Denies dysuria. ? ?Physical Exam  ? ?Triage Vital Signs: ?ED Triage Vitals  ?Enc Vitals Group  ?   BP 12/25/21 0116 (!) 158/71  ?   Pulse Rate 12/25/21 0116 (!) 52  ?   Resp 12/25/21 0116 18  ?   Temp 12/25/21 0116 98.3 ?F (36.8 ?C)  ?   Temp Source 12/25/21 0116 Oral  ?   SpO2 12/25/21 0116 94 %  ?   Weight 12/25/21 0110 195 lb (88.5 kg)  ?   Height 12/25/21 0110 5\' 5"  (1.651 m)  ?   Head Circumference --   ?   Peak Flow --   ?   Pain Score 12/25/21 0109 5  ?   Pain Loc --   ?   Pain Edu? --   ?   Excl. in GC? --   ? ? ?Most recent vital signs: ?Vitals:  ? 12/25/21 0121 12/25/21 0210  ?BP: (!) 158/71 (!) 154/55  ?Pulse: (!) 53 (!) 52  ?Resp:  17  ?Temp:    ?SpO2: 94% 92%  ? ? ?General: Awake, no distress.  ?CV:  Good peripheral perfusion.  ?Resp:  Normal effort.  ?Abd:  No distention.  Soft and benign throughout without guarding or peritoneal features. ?MSK:  No deformity noted.  ?Neuro:  No focal deficits appreciated. ?Other:   ? ? ?ED Results / Procedures / Treatments  ? ?Labs ?(all labs ordered are listed, but only abnormal results are displayed) ?Labs Reviewed  ?COMPREHENSIVE METABOLIC PANEL - Abnormal; Notable for the following components:  ?     Result Value  ? Glucose, Bld 117 (*)   ? BUN 42 (*)   ? AST 14 (*)   ? All other components within normal limits  ?URINALYSIS, ROUTINE W REFLEX MICROSCOPIC - Abnormal; Notable for the following components:  ? Color, Urine STRAW (*)   ? APPearance CLEAR (*)   ? All other components within normal limits  ?LIPASE, BLOOD  ?CBC  ? ? ?EKG ?Sinus rhythm, rate of 53 bpm.  Normal axis and intervals.  T wave inversion to aVL.  No clear signs of acute ischemia.  Very similar to EKG from 1 year ago. ? ?RADIOLOGY ?CT abdomen/pelvis reviewed by me without evidence of SBO or perforation ? ?Official radiology report(s): ?CT ABDOMEN PELVIS W CONTRAST ? ?Result Date: 12/25/2021 ?CLINICAL DATA:  Right lower quadrant pain. EXAM: CT ABDOMEN AND PELVIS WITH CONTRAST TECHNIQUE: Multidetector CT imaging of the abdomen and pelvis was performed using the standard protocol following bolus administration of intravenous contrast. RADIATION DOSE REDUCTION: This exam was performed according to the departmental dose-optimization program which includes automated exposure control, adjustment of the mA and/or  kV according to patient size and/or use of iterative reconstruction technique. CONTRAST:  OMNIPAQUE IOHEXOL 300 MG/ML  SOLN COMPARISON:  July 30, 2020 FINDINGS: Lower chest: No acute abnormality. Hepatobiliary: No focal liver abnormality is seen. No gallstones, gallbladder wall thickening, or biliary dilatation. Pancreas: Unremarkable. No pancreatic ductal dilatation or surrounding inflammatory changes. Spleen: Partially calcified and noncalcified cysts are seen within the spleen. Adrenals/Urinary Tract: Adrenal glands are unremarkable. Kidneys are normal, without renal calculi, focal lesion, or hydronephrosis. Bladder is unremarkable. Stomach/Bowel: Stomach is within normal limits. Appendix appears normal. No evidence of bowel wall thickening, distention, or inflammatory changes. Mildly inflamed diverticula are seen within the sigmoid  colon. Noninflamed diverticula are noted throughout the remainder of the large bowel. There is no evidence of associated perforation or abscess. Vascular/Lymphatic: Aortic atherosclerosis. No enlarged abdominal or pelvic lymph nodes. Reproductive: Status post hysterectomy. No adnexal masses. Other: No abdominal wall hernia or abnormality. No abdominopelvic ascites. Musculoskeletal: No acute or significant osseous findings. IMPRESSION: 1. Mild sigmoid diverticulitis without evidence of associated perforation or abscess. 2. Evidence of prior hysterectomy. 3. Aortic atherosclerosis. Aortic Atherosclerosis (ICD10-I70.0). Electronically Signed   By: Aram Candela M.D.   On: 12/25/2021 03:14   ? ?PROCEDURES and INTERVENTIONS: ? ?Procedures ? ?Medications  ?LORazepam (ATIVAN) injection 0.5 mg (0.5 mg Intravenous Given 12/25/21 0137)  ?iohexol (OMNIPAQUE) 300 MG/ML solution 100 mL (100 mLs Intravenous Contrast Given 12/25/21 0238)  ? ? ? ?IMPRESSION / MDM / ASSESSMENT AND PLAN / ED COURSE  ?I reviewed the triage vital signs and the nursing notes. ? ?71 year old female presents to the ED with a couple hours of acute RLQ abdominal pain with evidence of acute uncomplicated diverticulitis suitable for outpatient management.  Reassuring examination without guarding or peritoneal features.  Soft and benign throughout.  Normal CBC without leukocytosis.  Electrolytes and metabolic panel are within normal limits.  Lipase is negative.  Urine without infectious features.  CT abdomen/pelvis with evidence of uncomplicated diverticulitis without evidence of abscess or perforation.  We will discharge with a couple days of Augmentin and have her follow-up with GI.  Return precautions discussed. ? ?Clinical Course as of 12/25/21 0437  ?Fri Dec 25, 2021  ?0330 Reassessed and reexamined.  Continues to have a benign exam.  We discussed CT results with evidence of uncomplicated diverticulitis.  Discussed current recommendations of  uncomplicated diverticulitis not requiring antibiotics.  We discussed following up with GI.  Patient is fairly upset that I was not going to prescribe her any medications and questions if she can even go home without antibiotics.  I tried to reassure her that it is okay and we discussed bowel rest, fibrous diet and return precautions but she declines.  We will discharge with a couple days of Augmentin [DS]  ?  ?Clinical Course User Index ?[DS] Delton Prairie, MD  ? ? ? ?FINAL CLINICAL IMPRESSION(S) / ED DIAGNOSES  ? ?Final diagnoses:  ?Diverticulitis  ? ? ? ?Rx / DC Orders  ? ?ED Discharge Orders   ? ?      Ordered  ?  amoxicillin-clavulanate (AUGMENTIN) 875-125 MG tablet  2 times daily       ? 12/25/21 0328  ? ?  ?  ? ?  ? ? ? ?Note:  This document was prepared using Dragon voice recognition software and may include unintentional dictation errors. ?  ?Delton Prairie, MD ?12/25/21 0522 ? ?

## 2022-01-20 DIAGNOSIS — R001 Bradycardia, unspecified: Secondary | ICD-10-CM | POA: Insufficient documentation

## 2022-03-11 ENCOUNTER — Other Ambulatory Visit: Payer: Self-pay

## 2022-03-11 ENCOUNTER — Observation Stay
Admission: RE | Admit: 2022-03-11 | Discharge: 2022-03-11 | Disposition: A | Discharge status: Home / Self Care | Payer: Medicare Other | Attending: Cardiology | Admitting: Cardiology

## 2022-03-11 ENCOUNTER — Encounter: Payer: Self-pay | Admitting: Cardiology

## 2022-03-11 ENCOUNTER — Encounter
Admission: RE | Disposition: A | Discharge status: Home / Self Care | Payer: Self-pay | Source: Home / Self Care | Attending: Cardiology

## 2022-03-11 ENCOUNTER — Observation Stay: Payer: Medicare Other

## 2022-03-11 DIAGNOSIS — Z79899 Other long term (current) drug therapy: Secondary | ICD-10-CM | POA: Diagnosis not present

## 2022-03-11 DIAGNOSIS — I10 Essential (primary) hypertension: Secondary | ICD-10-CM | POA: Insufficient documentation

## 2022-03-11 DIAGNOSIS — I420 Dilated cardiomyopathy: Secondary | ICD-10-CM | POA: Diagnosis not present

## 2022-03-11 DIAGNOSIS — I495 Sick sinus syndrome: Secondary | ICD-10-CM | POA: Diagnosis present

## 2022-03-11 DIAGNOSIS — Z87891 Personal history of nicotine dependence: Secondary | ICD-10-CM | POA: Diagnosis not present

## 2022-03-11 HISTORY — PX: PACEMAKER PLACEMENT: SHX43

## 2022-03-11 HISTORY — PX: PACEMAKER IMPLANT: EP1218

## 2022-03-11 SURGERY — PACEMAKER IMPLANT
Anesthesia: Moderate Sedation

## 2022-03-11 MED ORDER — SODIUM CHLORIDE 0.9 % IV SOLN
80.0000 mg | INTRAVENOUS | Status: AC
  Administered 2022-03-11: 80 mg
  Filled 2022-03-11: qty 2

## 2022-03-11 MED ORDER — MIDAZOLAM HCL 2 MG/2ML IJ SOLN
INTRAMUSCULAR | Status: AC
  Filled 2022-03-11: qty 2

## 2022-03-11 MED ORDER — PAROXETINE HCL 20 MG PO TABS
20.0000 mg | ORAL_TABLET | Freq: Every day | ORAL | Status: DC
  Administered 2022-03-11: 20 mg via ORAL
  Filled 2022-03-11: qty 1

## 2022-03-11 MED ORDER — PAROXETINE HCL 20 MG PO TABS: 20.0000 mg | ORAL_TABLET | Freq: Every day | ORAL | Status: DC

## 2022-03-11 MED ORDER — LIDOCAINE HCL (PF) 1 % IJ SOLN
INTRAMUSCULAR | Status: DC | PRN
  Administered 2022-03-11: 30 mL

## 2022-03-11 MED ORDER — CEPHALEXIN 500 MG PO CAPS: 500.0000 mg | ORAL_CAPSULE | Freq: Two times a day (BID) | ORAL | Status: DC

## 2022-03-11 MED ORDER — CEFAZOLIN SODIUM-DEXTROSE 1-4 GM/50ML-% IV SOLN
1.0000 g | Freq: Four times a day (QID) | INTRAVENOUS | Status: DC
  Administered 2022-03-11: 1 g via INTRAVENOUS
  Filled 2022-03-11 (×3): qty 50

## 2022-03-11 MED ORDER — FENTANYL CITRATE (PF) 100 MCG/2ML IJ SOLN
INTRAMUSCULAR | Status: AC
  Filled 2022-03-11: qty 2

## 2022-03-11 MED ORDER — METOPROLOL SUCCINATE ER 50 MG PO TB24: 25.0000 mg | ORAL_TABLET | Freq: Every day | ORAL | Status: DC

## 2022-03-11 MED ORDER — CEFAZOLIN SODIUM-DEXTROSE 2-4 GM/100ML-% IV SOLN: 2.0000 g | INTRAVENOUS | Status: DC

## 2022-03-11 MED ORDER — FENTANYL CITRATE (PF) 100 MCG/2ML IJ SOLN
INTRAMUSCULAR | Status: DC | PRN
  Administered 2022-03-11: 50 ug via INTRAVENOUS
  Administered 2022-03-11: 25 ug via INTRAVENOUS

## 2022-03-11 MED ORDER — SODIUM CHLORIDE 0.9 % IV SOLN: INTRAVENOUS | Status: DC

## 2022-03-11 MED ORDER — ONDANSETRON HCL 4 MG/2ML IJ SOLN: 4.0000 mg | Freq: Four times a day (QID) | INTRAMUSCULAR | Status: DC | PRN

## 2022-03-11 MED ORDER — LOSARTAN POTASSIUM 50 MG PO TABS
100.0000 mg | ORAL_TABLET | Freq: Every day | ORAL | Status: DC
  Administered 2022-03-11: 100 mg via ORAL
  Filled 2022-03-11: qty 2

## 2022-03-11 MED ORDER — MIDAZOLAM HCL 2 MG/2ML IJ SOLN
INTRAMUSCULAR | Status: DC | PRN
  Administered 2022-03-11: 2 mg via INTRAVENOUS
  Administered 2022-03-11: 1 mg via INTRAVENOUS

## 2022-03-11 MED ORDER — CEFAZOLIN SODIUM-DEXTROSE 1-4 GM/50ML-% IV SOLN
INTRAVENOUS | Status: AC | PRN
  Administered 2022-03-11: 2 g via INTRAVENOUS

## 2022-03-11 MED ORDER — HEPARIN (PORCINE) IN NACL 1000-0.9 UT/500ML-% IV SOLN
INTRAVENOUS | Status: DC | PRN
  Administered 2022-03-11: 500 mL

## 2022-03-11 MED ORDER — LIDOCAINE HCL 1 % IJ SOLN
INTRAMUSCULAR | Status: AC
  Filled 2022-03-11: qty 40

## 2022-03-11 MED ORDER — ACETAMINOPHEN 325 MG PO TABS
325.0000 mg | ORAL_TABLET | ORAL | Status: DC | PRN
  Administered 2022-03-11: 650 mg via ORAL
  Filled 2022-03-11: qty 2

## 2022-03-11 MED ORDER — CEPHALEXIN 500 MG PO CAPS: 500.0000 mg | ORAL_CAPSULE | Freq: Two times a day (BID) | ORAL | 0 refills | Status: DC

## 2022-03-11 SURGICAL SUPPLY — 14 items
CABLE SURG 12 DISP A/V CHANNEL (MISCELLANEOUS) ×2 IMPLANT
COVER EZ STRL 42X30 (DRAPES) ×1 IMPLANT
DEVICE DSSCT PLSMBLD 3.0S LGHT (MISCELLANEOUS) IMPLANT
INTRO PACEMAKR LEAD 9FR 13CM (INTRODUCER) ×2
INTRO PACEMKR SHEATH II 7FR (MISCELLANEOUS) ×2
INTRODUCER PACEMKR LD 9FR 13CM (INTRODUCER) IMPLANT
INTRODUCER PACEMKR SHTH II 7FR (MISCELLANEOUS) IMPLANT
IPG PACE AZUR XT DR MRI W1DR01 (Pacemaker) IMPLANT
LEAD CAPSURE NOVUS 45CM (Lead) ×1 IMPLANT
LEAD CAPSURE NOVUS 5076-52CM (Lead) ×1 IMPLANT
PACE AZURE XT DR MRI W1DR01 (Pacemaker) ×2 IMPLANT
PAD ELECT DEFIB RADIOL ZOLL (MISCELLANEOUS) ×2 IMPLANT
PLASMABLADE 3.0S W/LIGHT (MISCELLANEOUS) ×2
TRAY PACEMAKER INSERTION (PACKS) ×2 IMPLANT

## 2022-03-11 NOTE — Progress Notes (Signed)
Dr. Darrold Junker at bedside, speaking with pt. And her spouse re: pacer procedure post op . Both verbalize understanding of conversation at present.

## 2022-03-11 NOTE — Discharge Summary (Signed)
Discharge Summary      Patient ID: Joy Vaughan MRN: [id number] DOB/AGE: 02-13-1951 71 y.o.  Admit date: 03/11/2022 Discharge date: 03/11/2022  Primary Discharge Diagnosis sick sinus syndrome Secondary Discharge Diagnosis same, s/p Dual Chamber permanent pacemaker placement   Significant Diagnostic Studies: Dual chamber pacemaker implantation - 03/11/22  The left chest was prepped and draped in the usual sterile manner.  Anesthesia was obtained 1% lidocaine locally.  A 6 cm incision was performed to the left pectoral region.  Access was obtained to the left subclavian vein by fine-needle aspiration. MRI compatible leads were positioned into the right ventricular apical septum (Medtronic 5076 CapSureFix Novus MRI [id number]) and right atrial appendage (Medtronic 5076 CapSureFix Novus MRI  [id number]).  After proper thresholds were obtained the leads were sutured in place with 0 silk.  Pacemaker pocket was irrigated with gentamicin solution.  Leads were connected to an MRI compatible dual-chamber rate responsive pacemaker generator went to see Medtronic Azure XT DR MRI [id number]) and positioned into the pocket.  The pocket was closed with 2-0 and 4-0 Vicryl, respectively.  Steri-Strips and Tegaderm were applied.  Patient received milligrams of Versed and 75 mcg of fentanyl.  There were no periprocedural complications. Postprocedural interrogation revealed atrial lead impedance 532 ohms, P wave 1.8 mV, threshold 0.75 V at 0.40 ms pulse width, and ventricular lead impedance 817 ohms, R wave 7.3 mV, threshold 0.75 V at 0.40 ms pulse width. Estimated blood loss <50 mL.   Consults: none  Hospital Course: The patient was brought to the cardiac cath lab and underwent Medtronic Dual Chamber pacemaker placement with Dr. Marcina Millard on 03/11/2022. The patient tolerated with procedure well without complications. The device was interrogated post procedure and is functioning appropriately with  good threshold and lead impedance. The patient remained stable throughout the day and was ready for discharge and eager to go home the late afternoon of 7/27. She was given care instructions and will follow up in office in 1 week, or sooner if needed.    Discharge Exam: Blood pressure (!) 152/68, pulse 64, temperature 97.9 F (36.6 C), temperature source Oral, resp. rate 18, height 5\' 5"  (1.651 m), weight 91.2 kg, SpO2 96 %.   PHYSICAL EXAM General: Caucasian female, well nourished, in no acute distress.  HEENT:  Normocephalic and atraumatic. Neck:   No JVD.  Lungs: Normal respiratory effort on room air.  Clear to ascultation bilaterally. Chest: L chest with dry dressing and tegaderm in place with minimal serosanguinous drainage on bandage  Heart: HRRR . Normal S1 and S2 without gallops or murmurs. Radial and DP pulses 2+ bilaterally. Abdomen: non-distended appearing.  Msk: Normal strength and tone for age. Extremities: No clubbing, cyanosis, edema.  Neuro: Alert and oriented x3 Psych:  Answers questions appropriately.    Lab Results  Component Value Date   WBC 8.3 12/25/2021   HGB 13.5 12/25/2021   HCT 41.7 12/25/2021   MCV 89.1 12/25/2021   PLT 243 12/25/2021   No results for input(s): "NA", "K", "CL", "CO2", "BUN", "CREATININE", "CALCIUM", "PROT", "BILITOT", "ALKPHOS", "ALT", "AST", "GLUCOSE" in the last 168 hours.  Invalid input(s): "LABALBU"    Radiology: post procedure CXR with Left subclavian pacemaker without evidence of pneumothorax or acute cardiopulmonary disease  EKG: Sinus rhythm rate 62  FOLLOW UP PLANS AND APPOINTMENTS  Allergies as of 03/11/2022       Reactions   Ezetimibe    Other reaction(s): Muscle Pain   Statins    Other  reaction(s): Muscle Pain   Sulfa Antibiotics Itching, Rash        Medication List     STOP taking these medications    DULoxetine 30 MG capsule Commonly known as: CYMBALTA   zolpidem 5 MG tablet Commonly known as: AMBIEN        TAKE these medications    acetaminophen 500 MG tablet Commonly known as: TYLENOL Take 500 mg by mouth every 6 (six) hours as needed for mild pain, headache or fever.   Calcium Carb-Cholecalciferol 600-400 MG-UNIT Tabs Take by mouth.   cephALEXin 500 MG capsule Commonly known as: KEFLEX Take 1 capsule (500 mg total) by mouth 2 (two) times daily. Start taking on: March 12, 2022   Cholecalciferol 25 MCG (1000 UT) tablet Take 1,000 Units by mouth daily.   losartan-hydrochlorothiazide 100-25 MG tablet Commonly known as: HYZAAR Take 1 tablet by mouth daily.   metoprolol succinate 25 MG 24 hr tablet Commonly known as: TOPROL-XL Take 25 mg by mouth daily.        Follow-up Information     Paraschos, Alexander, MD. Go in 1 week(s).   Specialty: Cardiology Contact information: [address] [facility] [place] [address] [phone]                 BRING ALL MEDICATIONS WITH YOU TO FOLLOW UP APPOINTMENTS  Time spent with patient : >30 mins Signed:  Rebeca Allegra PA-C 03/11/2022, 4:40 PM

## 2022-03-11 NOTE — Progress Notes (Signed)
Medtronic rep. At bedside teaching pt. And her spouse about new pacemaker.

## 2022-03-11 NOTE — Discharge Instructions (Addendum)
Please keep your left arm in a sling overnight, you may remove it tomorrow. For 6 weeks, avoid lifting greater than 15 pounds or raising your left arm above your head. Please do not shower until Saturday (7/29) and do not submerge your left chest in water (no baths, swimming) for at least 1 week or until you follow up in office with Dr. Darrold Junker. You can remove the clear bandage on your left chest if it starts to peel off, but please do NOT take off the steri strips underneath (thin rectangular strips). Take all your medicines as prescribed, including the antibiotic I have prescribed called Keflex (cefalexin) twice daily for 10 days. Please call Dr. Darrold Junker' office at [facility] [phone]) or if you have any questions or concerns.

## 2022-03-11 NOTE — Progress Notes (Signed)
Portable chest xray done now.

## 2022-03-24 DIAGNOSIS — Z950 Presence of cardiac pacemaker: Secondary | ICD-10-CM | POA: Insufficient documentation

## 2022-04-28 DIAGNOSIS — E039 Hypothyroidism, unspecified: Secondary | ICD-10-CM

## 2022-04-28 HISTORY — DX: Hypothyroidism, unspecified: E03.9

## 2022-08-20 ENCOUNTER — Ambulatory Visit: Payer: Medicare Other | Admitting: Nurse Practitioner

## 2022-09-15 IMAGING — CR DG CHEST 2V
1 series · 2 of 2 positions shown · non-contrast
Comparison: 02/08/2012

CLINICAL DATA: Shortness of breath. Tested positive for U0EG5-OA 2
days ago. Hypoxia. Ex-smoker.

EXAM:
CHEST - 2 VIEW

[Series 1: dg chest 2 view · 0.14mm/px · 2 of 2 slices shown]
[im 1/2]
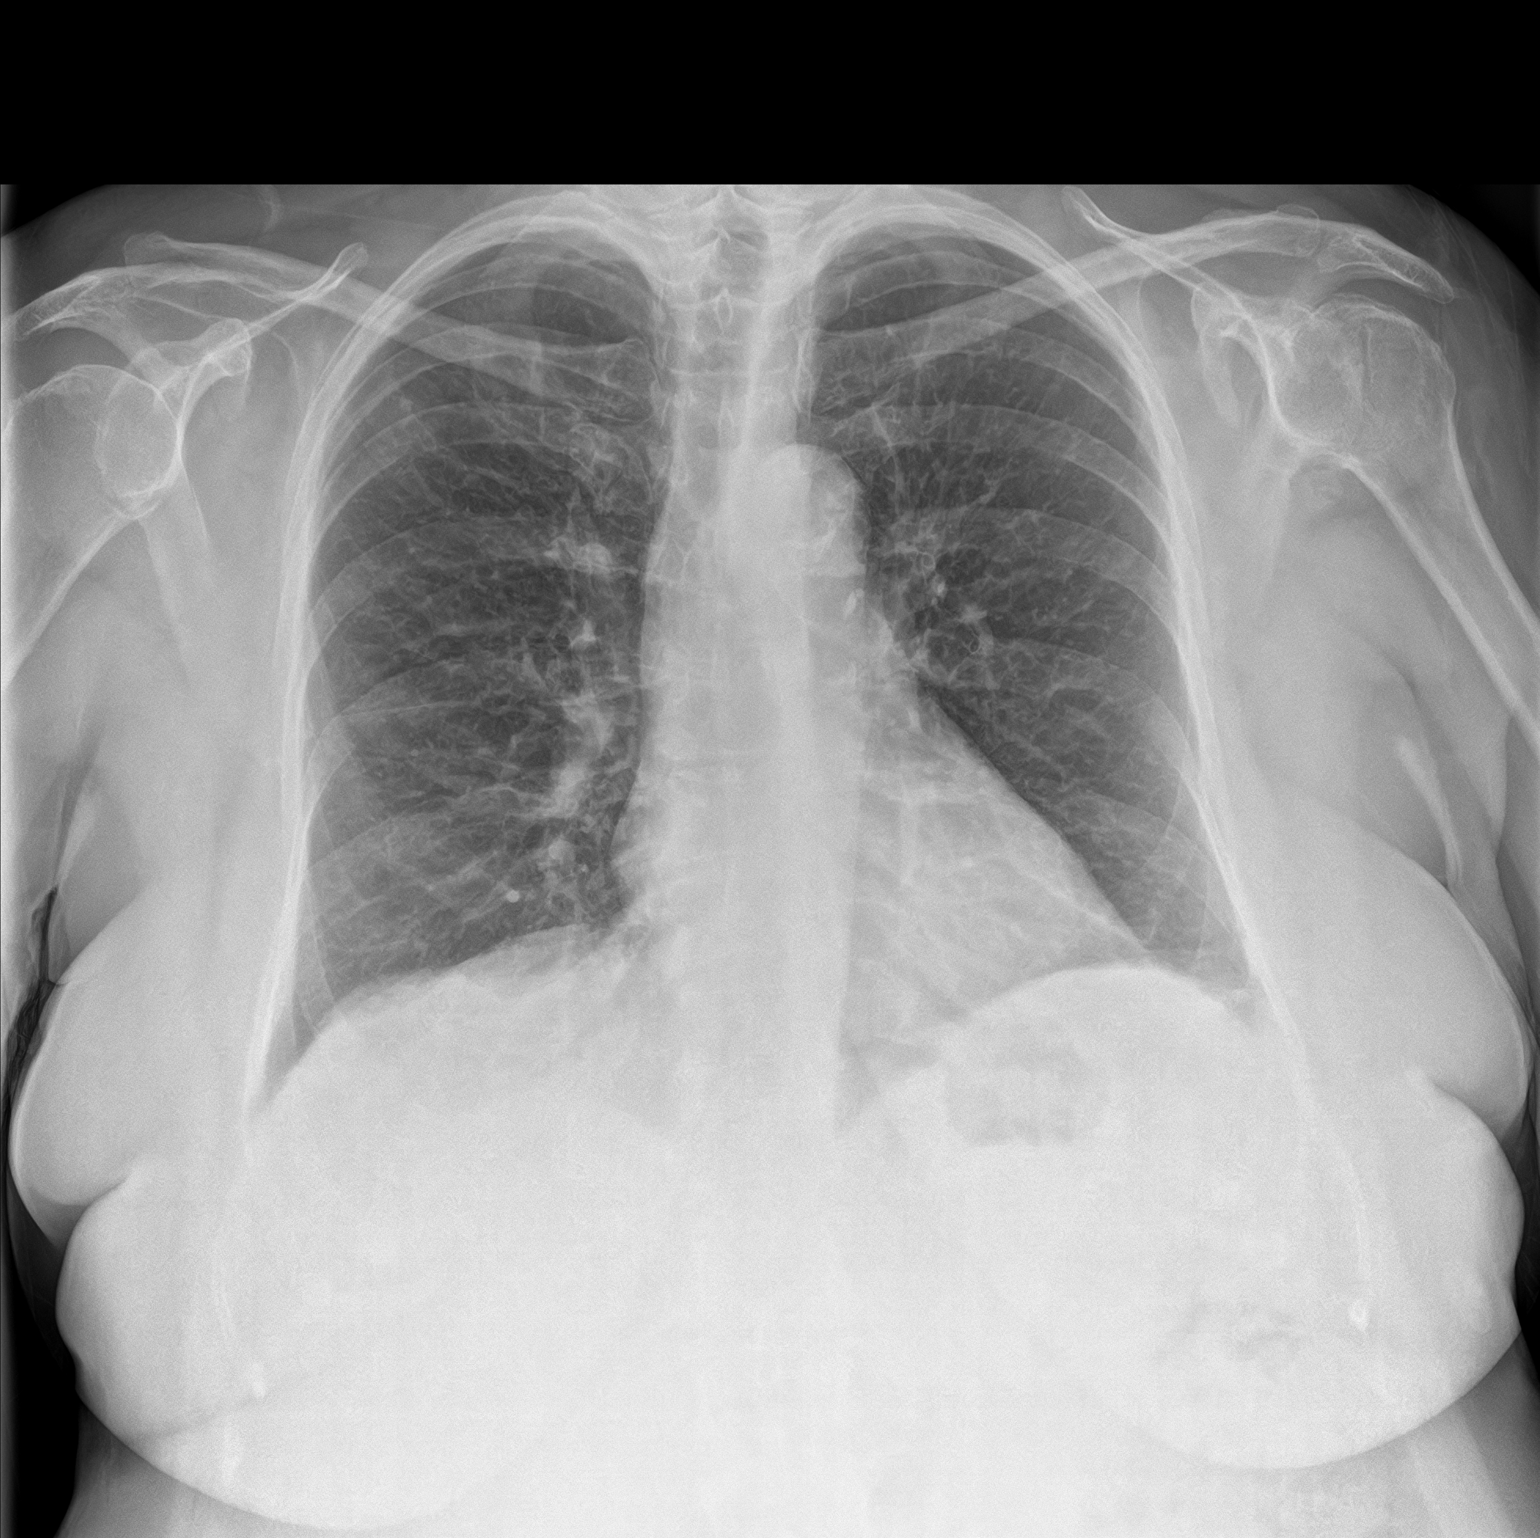
[im 2/2]
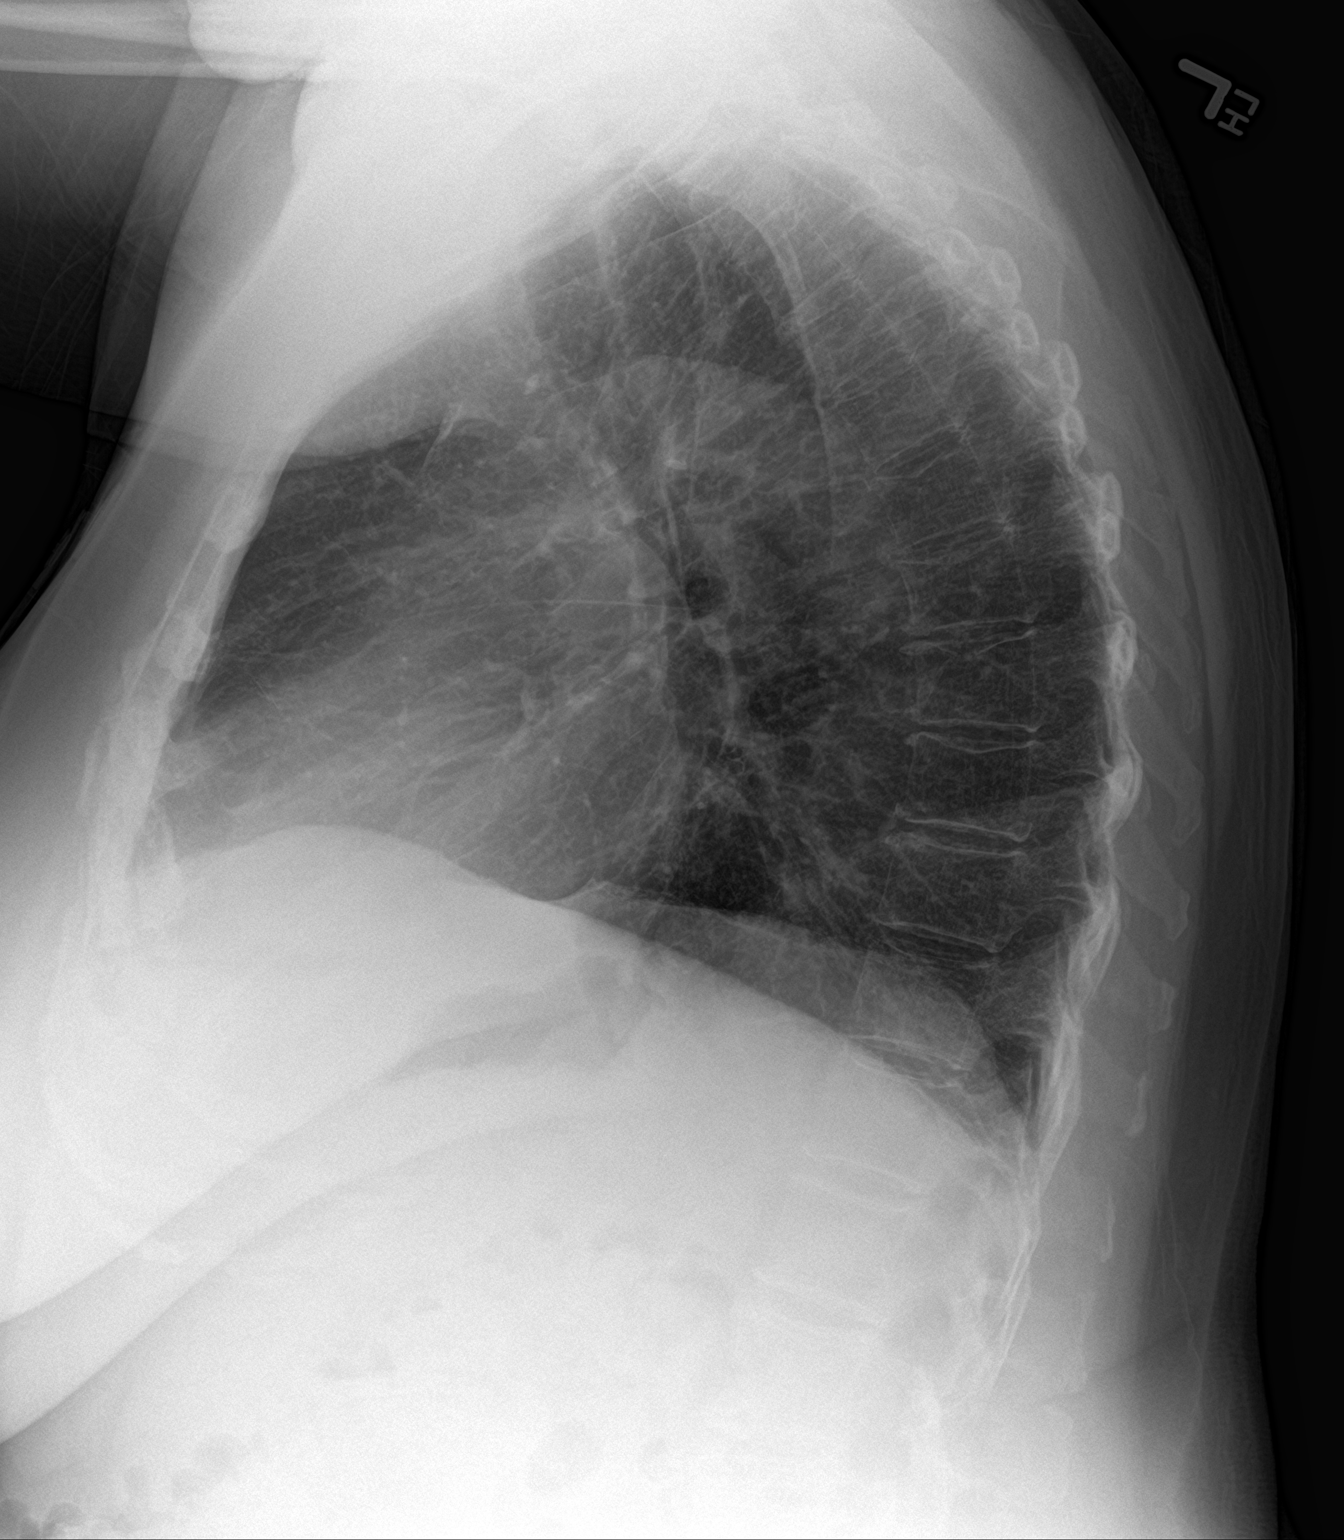

[2 of 2 positions shown; findings below may reference images not displayed]

FINDINGS: Normal sized heart. Clear lungs. Stable minimal peribronchial
thickening. Minimal linear atelectasis or scarring at the left
lateral costophrenic angle. Small right upper lobe calcified
granuloma. Mild thoracic spine degenerative changes.
IMPRESSION: 1. No acute abnormality.
2. Stable minimal chronic bronchitic changes.

## 2023-01-04 ENCOUNTER — Ambulatory Visit: Payer: Medicare Other | Admitting: Family Medicine

## 2023-01-18 DIAGNOSIS — K219 Gastro-esophageal reflux disease without esophagitis: Secondary | ICD-10-CM | POA: Insufficient documentation

## 2023-03-08 ENCOUNTER — Ambulatory Visit: Payer: Medicare Other | Admitting: Family Medicine

## 2023-03-16 ENCOUNTER — Emergency Department: Payer: Medicare Other

## 2023-03-16 ENCOUNTER — Emergency Department
Admission: EM | Admit: 2023-03-16 | Discharge: 2023-03-16 | Disposition: A | Discharge status: Home / Self Care | Payer: Medicare Other | Attending: Emergency Medicine | Admitting: Emergency Medicine

## 2023-03-16 ENCOUNTER — Other Ambulatory Visit: Payer: Self-pay

## 2023-03-16 DIAGNOSIS — R1030 Lower abdominal pain, unspecified: Secondary | ICD-10-CM | POA: Diagnosis present

## 2023-03-16 DIAGNOSIS — Z950 Presence of cardiac pacemaker: Secondary | ICD-10-CM | POA: Insufficient documentation

## 2023-03-16 DIAGNOSIS — D72829 Elevated white blood cell count, unspecified: Secondary | ICD-10-CM | POA: Diagnosis not present

## 2023-03-16 DIAGNOSIS — I10 Essential (primary) hypertension: Secondary | ICD-10-CM | POA: Insufficient documentation

## 2023-03-16 DIAGNOSIS — K5732 Diverticulitis of large intestine without perforation or abscess without bleeding: Secondary | ICD-10-CM | POA: Insufficient documentation

## 2023-03-16 DIAGNOSIS — I2510 Atherosclerotic heart disease of native coronary artery without angina pectoris: Secondary | ICD-10-CM | POA: Diagnosis not present

## 2023-03-16 LAB — CBC
HCT: 40.7 % (ref 36.0–46.0)
Hemoglobin: 14 g/dL (ref 12.0–15.0)
MCH: 30.7 pg (ref 26.0–34.0)
MCHC: 34.4 g/dL (ref 30.0–36.0)
MCV: 89.3 fL (ref 80.0–100.0)
Platelets: 298 10*3/uL (ref 150–400)
RBC: 4.56 MIL/uL (ref 3.87–5.11)
RDW: 12.7 % (ref 11.5–15.5)
WBC: 13.5 10*3/uL — ABNORMAL HIGH (ref 4.0–10.5)
nRBC: 0 % (ref 0.0–0.2)

## 2023-03-16 LAB — COMPREHENSIVE METABOLIC PANEL
ALT: 35 U/L (ref 0–44)
AST: 25 U/L (ref 15–41)
Albumin: 4.2 g/dL (ref 3.5–5.0)
Alkaline Phosphatase: 73 U/L (ref 38–126)
Anion gap: 10 (ref 5–15)
BUN: 17 mg/dL (ref 8–23)
CO2: 22 mmol/L (ref 22–32)
Calcium: 8.7 mg/dL — ABNORMAL LOW (ref 8.9–10.3)
Chloride: 102 mmol/L (ref 98–111)
Creatinine, Ser: 0.89 mg/dL (ref 0.44–1.00)
GFR, Estimated: >60 mL/min (ref >60)
Glucose, Bld: 122 mg/dL — ABNORMAL HIGH (ref 70–99)
Potassium: 3.8 mmol/L (ref 3.5–5.1)
Sodium: 134 mmol/L — ABNORMAL LOW (ref 135–145)
Total Bilirubin: 0.9 mg/dL (ref 0.3–1.2)
Total Protein: 7.8 g/dL (ref 6.5–8.1)

## 2023-03-16 LAB — LIPASE, BLOOD: Lipase: 27 U/L (ref 11–51)

## 2023-03-16 LAB — URINALYSIS, ROUTINE W REFLEX MICROSCOPIC
Bilirubin Urine: NEGATIVE
Glucose, UA: NEGATIVE mg/dL
Hgb urine dipstick: NEGATIVE
Ketones, ur: NEGATIVE mg/dL
Leukocytes,Ua: NEGATIVE
Nitrite: NEGATIVE
Protein, ur: NEGATIVE mg/dL
Specific Gravity, Urine: >1.046 — ABNORMAL HIGH (ref 1.005–1.030)
pH: 7 (ref 5.0–8.0)

## 2023-03-16 MED ORDER — IOHEXOL 300 MG/ML  SOLN
100.0000 mL | Freq: Once | INTRAMUSCULAR | Status: AC | PRN
  Administered 2023-03-16: 100 mL via INTRAVENOUS

## 2023-03-16 MED ORDER — AMOXICILLIN-POT CLAVULANATE 875-125 MG PO TABS: 1.0000 | ORAL_TABLET | Freq: Three times a day (TID) | ORAL | 0 refills | Status: DC

## 2023-03-16 NOTE — ED Provider Notes (Signed)
[facility] Provider Note    Event Date/Time   First MD Initiated Contact with Patient 03/16/23 1648     (approximate)   History   Abdominal Pain   HPI  Joy Vaughan is a 72 y.o. female with PMH of diverticulosis, HTN, GERD, CAD 6 s/p pacemaker placement who presents for evaluation of lower abdominal pain.  Patient states that her pain began yesterday morning.  She last had a bowel movement on Sunday which is abnormal for her as she is usually pretty regular.  She believes it is a flareup of her diverticulitis as the pain feels similar.  She has had some nausea, but denies vomiting, fever, diarrhea.  She has not had much to eat or drink at all today.      Physical Exam   Triage Vital Signs: ED Triage Vitals  Encounter Vitals Group     BP 03/16/23 1633 114/64     Systolic BP Percentile --      Diastolic BP Percentile --      Pulse Rate 03/16/23 1633 (!) 105     Resp 03/16/23 1633 16     Temp --      Temp src --      SpO2 03/16/23 1633 95 %     Weight --      Height 03/16/23 1634 5\' 5"  (1.651 m)     Head Circumference --      Peak Flow --      Pain Score 03/16/23 1634 5     Pain Loc --      Pain Education --      Exclude from Growth Chart --     Most recent vital signs: Vitals:   03/16/23 1723 03/16/23 1800  BP: 123/65 138/68  Pulse: 80 88  Resp: 16   Temp: 98.5 F (36.9 C)   SpO2: 94% 97%    General: Awake, no distress.  CV:  Good peripheral perfusion.  RRR. Resp:  Normal effort.  CTAB. Abd:  No distention.  Soft, TTP across lower abdomen, bowel sounds appropriate.    ED Results / Procedures / Treatments   Labs (all labs ordered are listed, but only abnormal results are displayed) Labs Reviewed  COMPREHENSIVE METABOLIC PANEL - Abnormal; Notable for the following components:      Result Value   Sodium 134 (*)    Glucose, Bld 122 (*)    Calcium 8.7 (*)    All other components within normal limits  CBC - Abnormal; Notable for  the following components:   WBC 13.5 (*)    All other components within normal limits  URINALYSIS, ROUTINE W REFLEX MICROSCOPIC - Abnormal; Notable for the following components:   Color, Urine YELLOW (*)    APPearance CLEAR (*)    Specific Gravity, Urine >1.046 (*)    All other components within normal limits  LIPASE, BLOOD     RADIOLOGY  CT scan obtained, interpreted the images as well as reviewed the radiologist report.  Images show evidence of diverticulitis without perforation or abscess development.    PROCEDURES:  Critical Care performed: No  Procedures   MEDICATIONS ORDERED IN ED: Medications  iohexol (OMNIPAQUE) 300 MG/ML solution 100 mL (100 mLs Intravenous Contrast Given 03/16/23 1737)     IMPRESSION / MDM / ASSESSMENT AND PLAN / ED COURSE  I reviewed the triage vital signs and the nursing notes.  72 year old female presents for evaluation of lower abdominal pain.  Patient has an elevated HR in triage, otherwise VSS.  Patient in mild distress on exam, uncomfortable with movement.  Differential diagnosis includes, but is not limited to, diverticulitis, UTI, constipation, colitis, bowel obstruction.  Patient's presentation is most consistent with acute complicated illness / injury requiring diagnostic workup.  CBC notable for an elevated white count.  CMP and lipase unremarkable.  UA WNL.  CT abdomen pelvis obtained in the ED, interpreted the images as well as reviewed the radiologist report which shows acute uncomplicated sigmoid colon diverticulitis, hepatomegaly with fatty infiltration of the liver and aortic atherosclerosis.  Patient's exam is otherwise reassuring and I believe she is stable for outpatient management with a course of oral antibiotics.  I advised patient on a liquid diet and bowel rest.  She was given information for GI follow-up.  She can return to the ED with any new or worsening symptoms.      FINAL CLINICAL  IMPRESSION(S) / ED DIAGNOSES   Final diagnoses:  Diverticulitis large intestine w/o perforation or abscess w/o bleeding     Rx / DC Orders   ED Discharge Orders          Ordered    amoxicillin-clavulanate (AUGMENTIN) 875-125 MG tablet  Every 8 hours        03/16/23 1828             Note:  This document was prepared using Dragon voice recognition software and may include unintentional dictation errors.   Cameron Ali, PA-C 03/16/23 Elwyn Reach, MD 03/16/23 2003

## 2023-03-16 NOTE — ED Triage Notes (Addendum)
Pt to ed from home via POV for abdominal pain. Pt states "I think my diverticulitis has flaired up". Pt is caox4, in no acute distress and ambulatory with her rolling walker in triage. Pt refused wheel chair. Pt states "they normally send me for a CT and medicine to take".

## 2023-03-16 NOTE — Discharge Instructions (Addendum)
Your blood work showed an elevated white count which indicates an infection.  All other blood tests were normal.  Your urinalysis did not show signs of infection.  Your CT scan showed that you have diverticulitis.  This is treated with an antibiotic and a liquid diet.  I have also attached additional information.  Please follow-up with GI if you continue to have symptoms.  You can return to the ED with any new or worsening symptoms.

## 2023-03-16 NOTE — ED Notes (Signed)
Patient ambulated to restroom for urine attempt

## 2023-03-17 NOTE — ED Notes (Signed)
[facility] store where augmentin was sent is closed due to computer issues.  I called the augmentin as prescribed to [facility] [address] and the pateint is aware.

## 2023-03-20 ENCOUNTER — Inpatient Hospital Stay
Admission: EM | Admit: 2023-03-20 | Discharge: 2023-03-24 | DRG: 392 | Disposition: A | Discharge status: Home / Self Care | Payer: Medicare Other | Attending: Internal Medicine | Admitting: Internal Medicine

## 2023-03-20 ENCOUNTER — Other Ambulatory Visit: Payer: Self-pay

## 2023-03-20 ENCOUNTER — Emergency Department: Payer: Medicare Other

## 2023-03-20 ENCOUNTER — Encounter: Payer: Self-pay | Admitting: Family Medicine

## 2023-03-20 DIAGNOSIS — E669 Obesity, unspecified: Secondary | ICD-10-CM | POA: Diagnosis present

## 2023-03-20 DIAGNOSIS — K219 Gastro-esophageal reflux disease without esophagitis: Secondary | ICD-10-CM | POA: Diagnosis present

## 2023-03-20 DIAGNOSIS — K5780 Diverticulitis of intestine, part unspecified, with perforation and abscess without bleeding: Secondary | ICD-10-CM

## 2023-03-20 DIAGNOSIS — Z79899 Other long term (current) drug therapy: Secondary | ICD-10-CM | POA: Diagnosis not present

## 2023-03-20 DIAGNOSIS — I10 Essential (primary) hypertension: Secondary | ICD-10-CM | POA: Diagnosis present

## 2023-03-20 DIAGNOSIS — Z96652 Presence of left artificial knee joint: Secondary | ICD-10-CM | POA: Diagnosis present

## 2023-03-20 DIAGNOSIS — Z950 Presence of cardiac pacemaker: Secondary | ICD-10-CM

## 2023-03-20 DIAGNOSIS — E876 Hypokalemia: Secondary | ICD-10-CM | POA: Diagnosis present

## 2023-03-20 DIAGNOSIS — K5792 Diverticulitis of intestine, part unspecified, without perforation or abscess without bleeding: Principal | ICD-10-CM

## 2023-03-20 DIAGNOSIS — I495 Sick sinus syndrome: Secondary | ICD-10-CM | POA: Diagnosis present

## 2023-03-20 DIAGNOSIS — Z87891 Personal history of nicotine dependence: Secondary | ICD-10-CM

## 2023-03-20 DIAGNOSIS — Z888 Allergy status to other drugs, medicaments and biological substances status: Secondary | ICD-10-CM | POA: Diagnosis not present

## 2023-03-20 DIAGNOSIS — K5720 Diverticulitis of large intestine with perforation and abscess without bleeding: Secondary | ICD-10-CM | POA: Diagnosis present

## 2023-03-20 DIAGNOSIS — Z6834 Body mass index (BMI) 34.0-34.9, adult: Secondary | ICD-10-CM | POA: Diagnosis not present

## 2023-03-20 DIAGNOSIS — Z882 Allergy status to sulfonamides status: Secondary | ICD-10-CM | POA: Diagnosis not present

## 2023-03-20 HISTORY — DX: Diverticulitis of large intestine with perforation and abscess without bleeding: K57.20

## 2023-03-20 LAB — COMPREHENSIVE METABOLIC PANEL
ALT: 47 U/L — ABNORMAL HIGH (ref 0–44)
AST: 32 U/L (ref 15–41)
Albumin: 4 g/dL (ref 3.5–5.0)
Alkaline Phosphatase: 118 U/L (ref 38–126)
Anion gap: 13 (ref 5–15)
BUN: 15 mg/dL (ref 8–23)
CO2: 23 mmol/L (ref 22–32)
Calcium: 9 mg/dL (ref 8.9–10.3)
Chloride: 100 mmol/L (ref 98–111)
Creatinine, Ser: 0.85 mg/dL (ref 0.44–1.00)
GFR, Estimated: >60 mL/min (ref >60)
Glucose, Bld: 116 mg/dL — ABNORMAL HIGH (ref 70–99)
Potassium: 3.8 mmol/L (ref 3.5–5.1)
Sodium: 136 mmol/L (ref 135–145)
Total Bilirubin: 0.5 mg/dL (ref 0.3–1.2)
Total Protein: 7.9 g/dL (ref 6.5–8.1)

## 2023-03-20 LAB — CBC
HCT: 41 % (ref 36.0–46.0)
Hemoglobin: 13.7 g/dL (ref 12.0–15.0)
MCH: 31 pg (ref 26.0–34.0)
MCHC: 33.4 g/dL (ref 30.0–36.0)
MCV: 92.8 fL (ref 80.0–100.0)
Platelets: 332 10*3/uL (ref 150–400)
RBC: 4.42 MIL/uL (ref 3.87–5.11)
RDW: 12.4 % (ref 11.5–15.5)
WBC: 11.1 10*3/uL — ABNORMAL HIGH (ref 4.0–10.5)
nRBC: 0 % (ref 0.0–0.2)

## 2023-03-20 LAB — LIPASE, BLOOD: Lipase: 25 U/L (ref 11–51)

## 2023-03-20 MED ORDER — ONDANSETRON HCL 4 MG PO TABS: 4.0000 mg | ORAL_TABLET | Freq: Four times a day (QID) | ORAL | Status: DC | PRN

## 2023-03-20 MED ORDER — PIPERACILLIN-TAZOBACTAM 3.375 G IVPB 30 MIN
3.3750 g | Freq: Once | INTRAVENOUS | Status: AC
  Administered 2023-03-20: 3.375 g via INTRAVENOUS
  Filled 2023-03-20: qty 50

## 2023-03-20 MED ORDER — SODIUM CHLORIDE 0.9 % IV SOLN: INTRAVENOUS | Status: DC | PRN

## 2023-03-20 MED ORDER — MORPHINE SULFATE (PF) 2 MG/ML IV SOLN: 2.0000 mg | INTRAVENOUS | Status: DC | PRN

## 2023-03-20 MED ORDER — SODIUM CHLORIDE 0.9 % IV SOLN: INTRAVENOUS | Status: DC

## 2023-03-20 MED ORDER — ONDANSETRON HCL 4 MG/2ML IJ SOLN: 4.0000 mg | Freq: Four times a day (QID) | INTRAMUSCULAR | Status: DC | PRN

## 2023-03-20 MED ORDER — PIPERACILLIN-TAZOBACTAM 3.375 G IVPB
3.3750 g | Freq: Three times a day (TID) | INTRAVENOUS | Status: DC
  Administered 2023-03-20 – 2023-03-24 (×11): 3.375 g via INTRAVENOUS
  Filled 2023-03-20 (×11): qty 50

## 2023-03-20 MED ORDER — ACETAMINOPHEN 325 MG PO TABS
650.0000 mg | ORAL_TABLET | Freq: Four times a day (QID) | ORAL | Status: DC | PRN
  Administered 2023-03-20 – 2023-03-24 (×12): 650 mg via ORAL
  Filled 2023-03-20 (×12): qty 2

## 2023-03-20 MED ORDER — ENOXAPARIN SODIUM 60 MG/0.6ML IJ SOSY
0.5000 mg/kg | PREFILLED_SYRINGE | INTRAMUSCULAR | Status: DC
  Administered 2023-03-20 – 2023-03-21 (×2): 47.5 mg via SUBCUTANEOUS
  Filled 2023-03-20 (×4): qty 0.6

## 2023-03-20 MED ORDER — IOHEXOL 300 MG/ML  SOLN
100.0000 mL | Freq: Once | INTRAMUSCULAR | Status: AC | PRN
  Administered 2023-03-20: 100 mL via INTRAVENOUS

## 2023-03-20 NOTE — ED Provider Notes (Signed)
[facility] Provider Note    Event Date/Time   First MD Initiated Contact with Patient 03/20/23 1413     (approximate)   History   Abdominal Pain   HPI Jolynn Bajorek is a 72 y.o. female who presents for evaluation of persistent left-sided abdominal pain in the setting of recent diverticulitis diagnosis.  The patient was diagnosed with diverticulitis about 5 days ago in this emergency department and was started on Augmentin 3 times daily.  She says she has had 4 to 5 days worth of doses but she is feeling no better and infection may feel worse with additional pain in the left lower quadrant of her abdomen.  She has had nausea but no vomiting.  The pain on the right side of her abdomen that was present before has gone away.  No recent fevers.  Normal bowel movements, occasionally a little bit watery.     Physical Exam   Triage Vital Signs: ED Triage Vitals  Encounter Vitals Group     BP 03/20/23 1138 133/66     Systolic BP Percentile --      Diastolic BP Percentile --      Pulse Rate 03/20/23 1138 83     Resp 03/20/23 1138 20     Temp 03/20/23 1138 98 F (36.7 C)     Temp src --      SpO2 03/20/23 1138 98 %     Weight 03/20/23 1139 94.3 kg (208 lb)     Height 03/20/23 1139 1.651 m (5\' 5" )     Head Circumference --      Peak Flow --      Pain Score 03/20/23 1139 8     Pain Loc --      Pain Education --      Exclude from Growth Chart --     Most recent vital signs: Vitals:   03/20/23 1138 03/20/23 1430  BP: 133/66 (!) 147/72  Pulse: 83 62  Resp: 20   Temp: 98 F (36.7 C)   SpO2: 98% 98%    General: Awake, no obvious distress.  Patient is denying pain at this time. CV:  Good peripheral perfusion.  Regular rate and rhythm. Resp:  Normal effort. Speaking easily and comfortably, no accessory muscle usage nor intercostal retractions.   Abd:  No distention.  No tenderness to palpation of the right side but with some referred pain to the left.   Localized peritonitis on the left lower quadrant with guarding.   ED Results / Procedures / Treatments   Labs (all labs ordered are listed, but only abnormal results are displayed) Labs Reviewed  COMPREHENSIVE METABOLIC PANEL - Abnormal; Notable for the following components:      Result Value   Glucose, Bld 116 (*)    ALT 47 (*)    All other components within normal limits  CBC - Abnormal; Notable for the following components:   WBC 11.1 (*)    All other components within normal limits  LIPASE, BLOOD     RADIOLOGY CT of the abdomen and pelvis is pending at the time of transfer of care.   PROCEDURES:  Critical Care performed: No  Procedures    IMPRESSION / MDM / ASSESSMENT AND PLAN / ED COURSE  I reviewed the triage vital signs and the nursing notes.  Differential diagnosis includes, but is not limited to, failure of outpatient antibiotics to treat diverticulitis, development of perforation and/or abscess, SBO/ileus.  Patient's presentation is most consistent with acute presentation with potential threat to life or bodily function.  Labs/studies ordered: CMP, lipase, CBC, CT of the abdomen pelvis with IV contrast.  Interventions/Medications given:  Medications  iohexol (OMNIPAQUE) 300 MG/ML solution 100 mL (100 mLs Intravenous Contrast Given 03/20/23 1509)    (Note:  hospital course my include additional interventions and/or labs/studies not listed above.)   Vital signs stable.  Patient declines any pain or nausea medicine and said that she took some Tylenol at home before she came manage she is not currently hurting and is not nauseated.  Labs are all essentially normal with only very mild leukocytosis that is down slightly from 4 days ago.  Repeat CT of the abdomen and pelvis pending at time of transfer of care to Dr. Fanny Bien to determine if the patient has worsening infection or the development of a new abscess that would require IR  intervention.  Patient understands and agrees with the plan.  Discussed in person with Dr. Fanny Bien who is assuming care.         FINAL CLINICAL IMPRESSION(S) / ED DIAGNOSES   Final diagnoses:  Diverticulitis     Rx / DC Orders   ED Discharge Orders     None        Note:  This document was prepared using Dragon voice recognition software and may include unintentional dictation errors.   Loleta Rose, MD 03/20/23 1535

## 2023-03-20 NOTE — Consult Note (Signed)
PHARMACY -  BRIEF ANTIBIOTIC NOTE   Pharmacy has received consult(s) for Zosyn dosing from an ED provider.  The patient's profile has been reviewed for ht/wt/allergies/indication/available labs.    One time order(s) placed for Zosyn 3.375 grams IV x 1  Further antibiotics/pharmacy consults should be ordered by admitting physician if indicated.                       Thank you, Barrie Folk 03/20/2023  3:53 PM

## 2023-03-20 NOTE — Progress Notes (Signed)
PHARMACIST - PHYSICIAN COMMUNICATION  CONCERNING:  Enoxaparin (Lovenox) for DVT Prophylaxis    RECOMMENDATION: Patient was prescribed enoxaprin 40mg  q24 hours for VTE prophylaxis.   Filed Weights   03/20/23 1139  Weight: 94.3 kg (208 lb)    Body mass index is 34.61 kg/m.  Estimated Creatinine Clearance: 66.9 mL/min (by C-G formula based on SCr of 0.85 mg/dL).   Based on [facility] policy patient is candidate for enoxaparin 0.5mg /kg TBW SQ every 24 hours based on BMI being >30.  DESCRIPTION: Pharmacy has adjusted enoxaparin dose per [facility] policy.  Patient is now receiving enoxaparin 47.5 mg every 24 hours    Barrie Folk, PharmD Clinical Pharmacist  03/20/2023 4:28 PM

## 2023-03-20 NOTE — H&P (Addendum)
History and Physical    Patient: Joy Vaughan ZOX:[id number] DOB: 1950-12-27 DOA: 03/20/2023 DOS: the patient was seen and examined on 03/20/2023 PCP: Marisue Ivan, MD  Patient coming from: Home  Chief Complaint:  Chief Complaint  Patient presents with   Abdominal Pain   HPI: Joy Vaughan is a 72 y.o. female with medical history significant of GERD, hypertension, diverticulosis, sick sinus syndrome status post pacemaker placement presenting with abdominal pain diverticular abscess.  Patient noted to have been evaluated on July 31 for abdominal pain with formal diagnosis of diverticulitis.  Patient was given a course of Augmentin for treatment.  Per the patient, she has had worsening abdominal pain, nausea decreased p.o. intake.  No fevers or chills.  No chest pain or shortness of breath.  No hemiparesis or confusion.  No diarrhea.  Pain has progressively worsened over this timeframe. Presented to the ER afebrile, hemodynamically stable.  White count 11.1, hemoglobin 13.7, platelets 332, creatinine 0.85.  CT imaging today concerning for 1.1 cm sigmoid colon intramural abscess.  No evidence of pneumoperitoneum.  In discussion with Dr. Fanny Bien, case discussed with Dr. Everlene Farrier with general surgery.  Will observe for now.  Recommend IV Zosyn. Review of Systems: As mentioned in the history of present illness. All other systems reviewed and are negative. Past Medical History:  Diagnosis Date   Diverticulosis    GERD (gastroesophageal reflux disease)    Hypertension    Sleep apnea    Past Surgical History:  Procedure Laterality Date   PACEMAKER IMPLANT N/A 03/11/2022   Procedure: PACEMAKER IMPLANT;  Surgeon: Marcina Millard, MD;  Location: [facility] INVASIVE CV LAB;  Service: Cardiovascular;  Laterality: N/A;   PACEMAKER PLACEMENT Left 03/11/2022   REPLACEMENT TOTAL KNEE     Social History:  reports that she has quit smoking. She has never used smokeless tobacco. She reports that she does not drink  alcohol and does not use drugs.  Allergies  Allergen Reactions   Ezetimibe     Other reaction(s): Muscle Pain   Statins     Other reaction(s): Muscle Pain   Sulfa Antibiotics Itching and Rash    Family History  Problem Relation Age of Onset   Prostate cancer Neg Hx    Bladder Cancer Neg Hx    Testicular cancer Neg Hx    Kidney cancer Neg Hx     Prior to Admission medications   Medication Sig Start Date End Date Taking? Authorizing Provider  acetaminophen (TYLENOL) 500 MG tablet Take 500 mg by mouth every 6 (six) hours as needed for mild pain, headache or fever.    [provider]  amoxicillin-clavulanate (AUGMENTIN) 875-125 MG tablet Take 1 tablet by mouth every 8 (eight) hours for 5 days. 03/16/23 03/21/23  Cameron Ali, PA-C  Calcium Carb-Cholecalciferol 600-400 MG-UNIT TABS Take by mouth.    [provider]  Cholecalciferol 25 MCG (1000 UT) tablet Take 1,000 Units by mouth daily.    [provider]  losartan-hydrochlorothiazide (HYZAAR) 100-25 MG per tablet Take 1 tablet by mouth daily.    [provider]  metoprolol succinate (TOPROL-XL) 25 MG 24 hr tablet Take 25 mg by mouth daily.    [provider]    Physical Exam: Vitals:   03/20/23 1138 03/20/23 1139 03/20/23 1430  BP: 133/66  (!) 147/72  Pulse: 83  62  Resp: 20    Temp: 98 F (36.7 C)    SpO2: 98%  98%  Weight:  94.3 kg   Height:  5\' 5"  (1.651 m)    Physical Exam Constitutional:      Appearance: She is normal weight.  HENT:     Head: Normocephalic and atraumatic.     Nose: Nose normal.     Mouth/Throat:     Mouth: Mucous membranes are moist.  Eyes:     Pupils: Pupils are equal, round, and reactive to light.  Cardiovascular:     Rate and Rhythm: Normal rate and regular rhythm.  Pulmonary:     Effort: Pulmonary effort is normal.  Abdominal:     General: Bowel sounds are normal.     Comments: Positive moderate left lower quadrant tenderness to palpation  on exam  Musculoskeletal:        General: Normal range of motion.  Skin:    General: Skin is warm.  Neurological:     General: No focal deficit present.  Psychiatric:        Mood and Affect: Mood normal.     Data Reviewed:  There are no new results to review at this time. CT ABDOMEN PELVIS W CONTRAST CLINICAL DATA:  Diverticulitis, complication suspected  EXAM: CT ABDOMEN AND PELVIS WITH CONTRAST  TECHNIQUE: Multidetector CT imaging of the abdomen and pelvis was performed using the standard protocol following bolus administration of intravenous contrast.  RADIATION DOSE REDUCTION: This exam was performed according to the departmental dose-optimization program which includes automated exposure control, adjustment of the mA and/or kV according to patient size and/or use of iterative reconstruction technique.  CONTRAST:  OMNIPAQUE IOHEXOL 300 MG/ML  SOLN  COMPARISON:  CT AP 03/16/23  FINDINGS: Lower chest: Lung bases are clear.  Hepatobiliary: Liver has a normal contour. Contrast-enhancing lesion in the peripheral aspect of the right hepatic lobe is favored to represent a small flash filling hemangioma. No evidence of perihepatic fluid. No intra or extrahepatic biliary ductal dilatation.  Pancreas: No evidence of peripancreatic fat stranding to suggest pancreatitis.  Spleen: Unchanged indeterminate lesions in the upper and lower aspect of the spleen measuring up to 3.6 x 2.6 cm and 2.9 x 2.3 cm, respectively.  Adrenals/Urinary Tract: Bilateral adrenal glands are normal in appearance. Bilateral kidneys enhance homogeneously and symmetrically. No evidence of hydronephrosis or nephrolithiasis. The urinary bladder is fluid-filled, but the degree of streak artifact from bilateral hip arthroplasties limits evaluation.  Stomach/Bowel: No evidence of bowel obstruction. There is diverticulosis with redemonstration of diverticulitis of the descending/sigmoid colon  junction. Compared to prior exam there is a new low-density lesion along the posterior wall of the sigmoid colon measuring 1.1 x 1.1 cm (series 4, image 65), favored to represent an intramural abscess. No evidence of pneumoperitoneum. The appendix is not well visualized.  Vascular/Lymphatic: No significant vascular findings are present. No enlarged abdominal or pelvic lymph nodes.  Reproductive: Uterus and bilateral adnexa are unremarkable.  Other: No abdominal wall hernia or abnormality. No abdominopelvic ascites.  Musculoskeletal: No acute or significant osseous findings.  IMPRESSION: Redemonstration of diverticulitis at the descending/sigmoid colon junction. Compared to prior exam there is a new low-density lesion along the posterior wall of the sigmoid colon measuring 1.1 x 1.1 cm, favored to represent an intramural abscess. No evidence of pneumoperitoneum.  Electronically Signed   By: Lorenza Cambridge M.D.   On: 03/20/2023 15:43  Lab Results  Component Value Date   WBC 11.1 (H) 03/20/2023   HGB 13.7 03/20/2023   HCT 41.0 03/20/2023   MCV 92.8 03/20/2023   PLT 332 03/20/2023   Last metabolic panel  Lab Results  Component Value Date   GLUCOSE 116 (H) 03/20/2023   NA 136 03/20/2023   K 3.8 03/20/2023   CL 100 03/20/2023   CO2 23 03/20/2023   BUN 15 03/20/2023   CREATININE 0.85 03/20/2023   GFRNONAA >60 03/20/2023   CALCIUM 9.0 03/20/2023   PROT 7.9 03/20/2023   ALBUMIN 4.0 03/20/2023   BILITOT 0.5 03/20/2023   ALKPHOS 118 03/20/2023   AST 32 03/20/2023   ALT 47 (H) 03/20/2023   ANIONGAP 13 03/20/2023    Assessment and Plan: * Colonic diverticular abscess Failed outpatient course of p.o. antibiotics for recent diagnosis of diverticulitis with noted new 1 x 1 cm posterior sigmoid wall diverticular abscess on imaging In discussion with Dr. Fanny Bien, case discussed with general surgeon Dr. Everlene Farrier Recommend IV Zosyn Follow-up formal general surgery  recommendations Pain control Antiemetics Monitor  Sick sinus syndrome (HCC) S/p pacemaker placement    HTN (hypertension) BP stable Titrate home regimen  GERD (gastroesophageal reflux disease) PPI    Greater than 50% was spent in counseling and coordination of care with patient Total encounter time 55 minutes or more   Advance Care Planning:   Code Status: Full Code   Consults: General surgery-[place]   Family Communication: No family at the bedside   Severity of Illness: The appropriate patient status for this patient is INPATIENT. Inpatient status is judged to be reasonable and necessary in order to provide the required intensity of service to ensure the patient's safety. The patient's presenting symptoms, physical exam findings, and initial radiographic and laboratory data in the context of their chronic comorbidities is felt to place them at high risk for further clinical deterioration. Furthermore, it is not anticipated that the patient will be medically stable for discharge from the hospital within 2 midnights of admission.   * I certify that at the point of admission it is my clinical judgment that the patient will require inpatient hospital care spanning beyond 2 midnights from the point of admission due to high intensity of service, high risk for further deterioration and high frequency of surveillance required.*  Author: Floydene Flock, MD 03/20/2023 4:34 PM  For on call review [email or web address].

## 2023-03-20 NOTE — ED Triage Notes (Signed)
Pt states coming in the past Tuesday and diagnosed with diverticulitis and was sent home with antibiotics. Pt states the pain is not getting better so came in today.

## 2023-03-20 NOTE — Assessment & Plan Note (Addendum)
Failed outpatient course of p.o. antibiotics for recent diagnosis of diverticulitis with noted new 1 x 1 cm posterior sigmoid wall diverticular abscess on imaging. General surgery was consulted-abscess is too small for any intervention. Clinically seems improving. -Continue with Zosyn for another day -If continue to improve then can be discharged home tomorrow on Cipro and Flagyl -Continue with supportive care

## 2023-03-20 NOTE — Assessment & Plan Note (Signed)
S/p pacemaker placement.

## 2023-03-20 NOTE — Consult Note (Signed)
Patient ID: Joy Vaughan, female   DOB: 09-01-1950, 72 y.o.   MRN: [id number]  HPI Joy Vaughan is a 72 y.o. female in consultation at the request of Dr. Alvester Morin.  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  This time she abdominal pain the last week.  She recently come to the emergency room and was given Augmentin for outpatient antibiotic treatment.  Symptoms did not improve.  She has also significant pain that is intermittent lower abdomen and sharp.  It is moderate in intensity and has associated nausea. White count 11.1, hemoglobin 13.7, platelets 332, creatinine 0.85. CT imaging personal reviewed  concerning for 1.1 cm sigmoid colon intramural abscess. + diverticulitis. No evidence of pneumoperitoneum.  She does have history of sick sinus syndrome and had a recent pacemaker. 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 HPI  Past Medical History:  Diagnosis Date   Diverticulosis    GERD (gastroesophageal reflux disease)    Hypertension    Sleep apnea     Past Surgical History:  Procedure Laterality Date   PACEMAKER IMPLANT N/A 03/11/2022   Procedure: PACEMAKER IMPLANT;  Surgeon: Marcina Millard, MD;  Location: [facility] INVASIVE CV LAB;  Service: Cardiovascular;  Laterality: N/A;   PACEMAKER PLACEMENT Left 03/11/2022   REPLACEMENT TOTAL KNEE      Family History  Problem Relation Age of Onset   Prostate cancer Neg Hx    Bladder Cancer Neg Hx    Testicular cancer Neg Hx    Kidney cancer Neg Hx     Social History Social History   Tobacco Use   Smoking status: Former   Smokeless tobacco: Never  Vaping Use   Vaping status: Never Used  Substance Use Topics   Alcohol use: No   Drug use: No    Allergies  Allergen Reactions   Ezetimibe     Other reaction(s): Muscle Pain   Statins     Other reaction(s): Muscle Pain   Sulfa Antibiotics Itching and Rash    Current Facility-Administered  Medications  Medication Dose Route Frequency Provider Last Rate Last Admin   0.9 %  sodium chloride infusion   Intravenous Continuous Floydene Flock, MD       enoxaparin (LOVENOX) injection 47.5 mg  0.5 mg/kg Subcutaneous Q24H Floydene Flock, MD       morphine (PF) 2 MG/ML injection 2 mg  2 mg Intravenous Q3H PRN Floydene Flock, MD       ondansetron [facility]) tablet 4 mg  4 mg Oral Q6H PRN Floydene Flock, MD       Or   ondansetron [facility]) injection 4 mg  4 mg Intravenous Q6H PRN Floydene Flock, MD       piperacillin-tazobactam (ZOSYN) IVPB 3.375 g  3.375 g Intravenous Q8H Barrie Folk, [facility]         Review of Systems Full ROS  was asked and was negative except for the information on the HPI  Physical Exam Blood pressure 137/68, pulse 62, temperature 98 F (36.7 C), resp. rate 18, height 5\' 5"  (1.651 m), weight 94.3 kg, SpO2 98%. CONSTITUTIONAL: NAD. EYES: Pupils are equal, round, and reactive to light, Sclera are non-icteric. EARS, NOSE, MOUTH AND THROAT: The oropharynx is clear. The oral mucosa is pink and moist. Hearing is intact to voice. LYMPH NODES:  Lymph nodes in the neck are normal. RESPIRATORY:  Lungs are clear. There  is normal respiratory effort, with equal breath sounds bilaterally, and without pathologic use of accessory muscles. CARDIOVASCULAR: Heart is regular without murmurs, gallops, or rubs. GI: The abdomen is  soft, tender to palpation over the left lower quadrant without peritonitis or rebound.  No obvious masses.  There are normal bowel sounds GU: Rectal deferred.   MUSCULOSKELETAL: Normal muscle strength and tone. No cyanosis or edema.   SKIN: Turgor is good and there are no pathologic skin lesions or ulcers. NEUROLOGIC: Motor and sensation is grossly normal. Cranial nerves are grossly intact. PSYCH:  Oriented to person, place and time. Affect is normal.  Data Reviewed  I have personally reviewed the patient's imaging, laboratory findings and  medical records.    Assessment/Plan 72 year old female with multiple episode of recurrent diverticulitis now with abscess.  Discussed with the patient in detail about her disease process.  I do think that we can manage this medically with IV antibiotics.  No need for emergent surgical intervention.  She will eventually benefit from elective sigmoid colectomy.  We did have a discussion regarding this.  She will also need to complete a colonoscopy.  She expresses understanding.  Will continue to follow recheck labs and serial abdominal exams.   Sterling Big, MD FACS General Surgeon 03/20/2023, 5:41 PM

## 2023-03-20 NOTE — ED Provider Notes (Signed)
CT ABDOMEN PELVIS W CONTRAST  Result Date: 03/20/2023 CLINICAL DATA:  Diverticulitis, complication suspected EXAM: CT ABDOMEN AND PELVIS WITH CONTRAST TECHNIQUE: Multidetector CT imaging of the abdomen and pelvis was performed using the standard protocol following bolus administration of intravenous contrast. RADIATION DOSE REDUCTION: This exam was performed according to the departmental dose-optimization program which includes automated exposure control, adjustment of the mA and/or kV according to patient size and/or use of iterative reconstruction technique. CONTRAST:  OMNIPAQUE IOHEXOL 300 MG/ML  SOLN COMPARISON:  CT AP 03/16/23 FINDINGS: Lower chest: Lung bases are clear. Hepatobiliary: Liver has a normal contour. Contrast-enhancing lesion in the peripheral aspect of the right hepatic lobe is favored to represent a small flash filling hemangioma. No evidence of perihepatic fluid. No intra or extrahepatic biliary ductal dilatation. Pancreas: No evidence of peripancreatic fat stranding to suggest pancreatitis. Spleen: Unchanged indeterminate lesions in the upper and lower aspect of the spleen measuring up to 3.6 x 2.6 cm and 2.9 x 2.3 cm, respectively. Adrenals/Urinary Tract: Bilateral adrenal glands are normal in appearance. Bilateral kidneys enhance homogeneously and symmetrically. No evidence of hydronephrosis or nephrolithiasis. The urinary bladder is fluid-filled, but the degree of streak artifact from bilateral hip arthroplasties limits evaluation. Stomach/Bowel: No evidence of bowel obstruction. There is diverticulosis with redemonstration of diverticulitis of the descending/sigmoid colon junction. Compared to prior exam there is a new low-density lesion along the posterior wall of the sigmoid colon measuring 1.1 x 1.1 cm (series 4, image 65), favored to represent an intramural abscess. No evidence of pneumoperitoneum. The appendix is not well visualized. Vascular/Lymphatic: No significant vascular  findings are present. No enlarged abdominal or pelvic lymph nodes. Reproductive: Uterus and bilateral adnexa are unremarkable. Other: No abdominal wall hernia or abnormality. No abdominopelvic ascites. Musculoskeletal: No acute or significant osseous findings. IMPRESSION: Redemonstration of diverticulitis at the descending/sigmoid colon junction. Compared to prior exam there is a new low-density lesion along the posterior wall of the sigmoid colon measuring 1.1 x 1.1 cm, favored to represent an intramural abscess. No evidence of pneumoperitoneum. Electronically Signed   By: Lorenza Cambridge M.D.   On: 03/20/2023 15:43    ----------------------------------------- 3:53 PM on 03/20/2023 ----------------------------------------- CT concerning for development of potential abscess.  Discussed with and consultation will be provided by Dr. Everlene Farrier   Patient is understanding agreeable with plan for admission.  I consulted with and discussed the case with our hospitalist Dr. Alvester Morin  Antibiotic expanded to include Zosyn after discussion with general surgery     Sharyn Creamer, MD 03/20/23 [phone]

## 2023-03-20 NOTE — Assessment & Plan Note (Signed)
BP stable Titrate home regimen 

## 2023-03-20 NOTE — Assessment & Plan Note (Signed)
PPI ?

## 2023-03-20 NOTE — Consult Note (Signed)
Pharmacy Antibiotic Note  Joy Vaughan is a 72 y.o. female admitted on 03/20/2023 with intraabdominal infection.  Pharmacy has been consulted for Zosyn dosing.  Plan: Zosyn 3.375g IV q8h (4 hour infusion).  Height: 5\' 5"  (165.1 cm) Weight: 94.3 kg (208 lb) IBW/kg (Calculated) : 57  Temp (24hrs), Avg:98 F (36.7 C), Min:98 F (36.7 C), Max:98 F (36.7 C)  Recent Labs  Lab 03/16/23 1637 03/20/23 1141  WBC 13.5* 11.1*  CREATININE 0.89 0.85    Estimated Creatinine Clearance: 66.9 mL/min (by C-G formula based on SCr of 0.85 mg/dL).    Allergies  Allergen Reactions   Ezetimibe     Other reaction(s): Muscle Pain   Statins     Other reaction(s): Muscle Pain   Sulfa Antibiotics Itching and Rash    Antimicrobials this admission: Zosyn 8/4 >>    Dose adjustments this admission: N/A  Microbiology results: N/A  Thank you for allowing pharmacy to be a part of this patient's care.  Barrie Folk 03/20/2023 4:21 PM

## 2023-03-21 DIAGNOSIS — K5720 Diverticulitis of large intestine with perforation and abscess without bleeding: Secondary | ICD-10-CM | POA: Diagnosis not present

## 2023-03-21 DIAGNOSIS — K5792 Diverticulitis of intestine, part unspecified, without perforation or abscess without bleeding: Secondary | ICD-10-CM

## 2023-03-21 DIAGNOSIS — K5780 Diverticulitis of intestine, part unspecified, with perforation and abscess without bleeding: Secondary | ICD-10-CM | POA: Diagnosis not present

## 2023-03-21 MED ORDER — POTASSIUM CHLORIDE CRYS ER 20 MEQ PO TBCR
40.0000 meq | EXTENDED_RELEASE_TABLET | Freq: Once | ORAL | Status: AC
  Administered 2023-03-21: 40 meq via ORAL
  Filled 2023-03-21: qty 2

## 2023-03-21 MED ORDER — ZOLPIDEM TARTRATE 5 MG PO TABS
5.0000 mg | ORAL_TABLET | Freq: Every evening | ORAL | Status: DC | PRN
  Administered 2023-03-21 – 2023-03-23 (×3): 5 mg via ORAL
  Filled 2023-03-21 (×3): qty 1

## 2023-03-21 NOTE — Hospital Course (Addendum)
Taken from H&P.  Joy Vaughan is a 72 y.o. female with medical history significant of GERD, hypertension, diverticulosis, sick sinus syndrome status post pacemaker placement presenting with abdominal pain and found to have diverticular abscess.  Patient noted to have been evaluated on July 31 for abdominal pain with formal diagnosis of diverticulitis. Patient was given a course of Augmentin for treatment.   On arrival patient was hemodynamically stable, WBC 11.1. CT imaging today concerning for 1.1 cm sigmoid colon intramural abscess. No evidence of pneumoperitoneum.  General surgery was consulted and patient was started on Zosyn.  Per general surgery abscess is too small for any intervention and they recommend continuation of IV Zosyn at this time.  8/5: Hemodynamically stable, leukocytosis resolved.  General surgery would like to continue IV antibiotics for another day, if remains stable can be discharged tomorrow on Cipro and Flagyl  8/6: Continued to have some LLQ pain and tenderness.  General surgery would like to see no pain before advancing diet so she will remain on CLD and Zosyn.  8/7: Vitals and labs stable.  Diet advanced to full liqui, will advance to GI soft for dinner and if able to tolerate then she can be discharged tomorrow on p.o. antibiotic

## 2023-03-21 NOTE — Progress Notes (Signed)
[place] [facility] PRO[facility]ESS NOTE (cpt [phone])  Hospital Day(s): 1.  Interval History: Patient seen and examined, no acute events or new complaints overnight. Patient reports she feels slightly improved from admission. Still with LLQ pain. She denies fever, chills, nausea, emesis. She is without leukocytosis this morning; 10.1K. Hgb to 12.5. Renal function is normal; sCr - 0.78; UO - unmeasured. Mild hypokalemia to 3.3. She is on CLD. She continues on Zosyn.   Review of Systems:  Constitutional: denies fever, chills  HEENT: denies cough or congestion  Respiratory: denies any shortness of breath  Cardiovascular: denies chest pain or palpitations  Gastrointestinal: + abdominal pain, denied N/V Genitourinary: denies burning with urination or urinary frequency Musculoskeletal: denies pain, decreased motor or sensation   Vital signs in last 24 hours: [min-max] current  Temp:  [98 F (36.7 C)-98.4 F (36.9 C)] 98.4 F (36.9 C) (08/05 0419) Pulse Rate:  [62-83] 71 (08/05 0419) Resp:  [18-20] 18 (08/05 0419) BP: (126-147)/(58-72) 126/58 (08/05 0419) SpO2:  [94 %-98 %] 94 % (08/05 0419) Weight:  [94.3 kg] 94.3 kg (08/04 1139)     Height: 5\' 5"  (165.1 cm) Weight: 94.3 kg BMI (Calculated): 34.61   Intake/Output last 2 shifts:  08/04 0701 - 08/05 0700 In: 1054.6 [P.O.:600; I.V.:404.6; IV Piggyback:50] Out: -    Physical Exam:  Constitutional: alert, cooperative and no distress  HENT: normocephalic without obvious abnormality  Eyes: PERRL, EOM's grossly intact and symmetric Respiratory: breathing non-labored at rest  Cardiovascular: regular rate and sinus rhythm  Gastrointestinal: soft, she is still point tender in LLQ, and non-distended. No rebound/guarding. She is without peritonitis Musculoskeletal: no edema or wounds, motor and sensation grossly intact, NT    Labs:     Latest Ref Rng & Units 03/21/2023    3:41 AM 03/20/2023   11:41 AM 03/16/2023    4:37 PM  CBC   WBC 4.0 - 10.5 K/uL 10.1  11.1  13.5   Hemoglobin 12.0 - 15.0 g/dL 60.4  54.0  98.1   Hematocrit 36.0 - 46.0 % 36.5  41.0  40.7   Platelets 150 - 400 K/uL 277  332  298       Latest Ref Rng & Units 03/21/2023    3:41 AM 03/20/2023   11:41 AM 03/16/2023    4:37 PM  CMP  Glucose 70 - 99 mg/dL 191  478  295   BUN 8 - 23 mg/dL 14  15  17    Creatinine 0.44 - 1.00 mg/dL 6.21  3.08  6.57   Sodium 135 - 145 mmol/L 136  136  134   Potassium 3.5 - 5.1 mmol/L 3.3  3.8  3.8   Chloride 98 - 111 mmol/L 102  100  102   CO2 22 - 32 mmol/L 23  23  22    Calcium 8.9 - 10.3 mg/dL 8.6  9.0  8.7   Total Protein 6.5 - 8.1 g/dL 7.2  7.9  7.8   Total Bilirubin 0.3 - 1.2 mg/dL 0.7  0.5  0.9   Alkaline Phos 38 - 126 U/L 117  118  73   AST 15 - 41 U/L 33  32  25   ALT 0 - 44 U/L 45  47  35      Imaging studies: No new pertinent imaging studies   Assessment/Plan: (ICD-10's: K50.92) 72 y.o. female with recurrent diverticulitis with small abscess   - Seems there was some confusion and patient reports being told numerous times  she was having a drain placed or having surgery last night. To clarify, there are no plans for emergent surgical intervention. In regards to this abscess, it is quite small and not in most amenable position for percutaneous approach. No plans for percutaneous drainage.   - Okay to continue CLD; NPO if pain worsens  - Abscess likely too small for percutaneous drainage   - No need for emergent surgical intervention; will benefit from   - Continue IV Abx (Zosyn)  - Monitor abdominal examination; on-going bowel function  - Pain control prn; antiemetics prn  - Replete K+  - Mobilize as tolerated   - Further management per primary service; we will follow    All of the above findings and recommendations were discussed with the patient, patient's family (husband at bedside), and the medical team, and all of patient's and family's questions were answered to their expressed  satisfaction.  -- Lynden Oxford, PA-C [facility] 03/21/2023, 7:35 AM M-F: 7am - 4pm

## 2023-03-21 NOTE — Plan of Care (Signed)

## 2023-03-21 NOTE — Progress Notes (Signed)
  Progress Note   Patient: Joy Vaughan ZOX:[id number] DOB: 20-Sep-1950 DOA: 03/20/2023     1 DOS: the patient was seen and examined on 03/21/2023   Brief hospital course: Taken from H&P.  Ripley Reigle is a 72 y.o. female with medical history significant of GERD, hypertension, diverticulosis, sick sinus syndrome status post pacemaker placement presenting with abdominal pain and found to have diverticular abscess.  Patient noted to have been evaluated on July 31 for abdominal pain with formal diagnosis of diverticulitis. Patient was given a course of Augmentin for treatment.   On arrival patient was hemodynamically stable, WBC 11.1. CT imaging today concerning for 1.1 cm sigmoid colon intramural abscess. No evidence of pneumoperitoneum.  General surgery was consulted and patient was started on Zosyn.  Per general surgery abscess is too small for any intervention and they recommend continuation of IV Zosyn at this time.  8/5: Hemodynamically stable, leukocytosis resolved.  General surgery would like to continue IV antibiotics for another day, if remains stable can be discharged tomorrow on Cipro and Flagyl    Assessment and Plan: * Colonic diverticular abscess Failed outpatient course of p.o. antibiotics for recent diagnosis of diverticulitis with noted new 1 x 1 cm posterior sigmoid wall diverticular abscess on imaging. General surgery was consulted-abscess is too small for any intervention. Clinically seems improving. -Continue with Zosyn for another day -If continue to improve then can be discharged home tomorrow on Cipro and Flagyl -Continue with supportive care  HTN (hypertension) BP stable Titrate home regimen  Sick sinus syndrome [facility]) S/p pacemaker placement    GERD (gastroesophageal reflux disease) PPI   Subjective: Patient was seen and examined today.  She was walking around the room.  Still having mild left lower quadrant pain.  No worsening.  No nausea or  vomiting.  Physical Exam: Vitals:   03/20/23 1721 03/20/23 1956 03/21/23 0419 03/21/23 0744  BP: 137/68 135/60 (!) 126/58 137/64  Pulse: 62 76 71 72  Resp: 18 18 18 19   Temp: 98 F (36.7 C) 98.1 F (36.7 C) 98.4 F (36.9 C) 97.7 F (36.5 C)  TempSrc:  Oral Oral   SpO2: 98% 96% 94% 97%  Weight:      Height:       General.  Obese elderly lady, in no acute distress. Pulmonary.  Lungs clear bilaterally, normal respiratory effort. CV.  Regular rate and rhythm, no JVD, rub or murmur. Abdomen.  Soft, mild LLQ tenderness, nondistended, BS positive. CNS.  Alert and oriented .  No focal neurologic deficit. Extremities.  No edema, no cyanosis, pulses intact and symmetrical. Psychiatry.  Judgment and insight appears normal.   Data Reviewed: Prior data reviewed  Family Communication: Gust with patient  Disposition: Status is: Inpatient Remains inpatient appropriate because: Severity of illness  Planned Discharge Destination: Home  Time spent: 45 minutes  This record has been created using Conservation officer, historic buildings. Errors have been sought and corrected,but may not always be located. Such creation errors do not reflect on the standard of care.   Author: Arnetha Courser, MD 03/21/2023 2:52 PM  For on call review [email or web address].

## 2023-03-22 DIAGNOSIS — K5780 Diverticulitis of intestine, part unspecified, with perforation and abscess without bleeding: Secondary | ICD-10-CM | POA: Diagnosis not present

## 2023-03-22 DIAGNOSIS — K5792 Diverticulitis of intestine, part unspecified, without perforation or abscess without bleeding: Secondary | ICD-10-CM | POA: Diagnosis not present

## 2023-03-22 DIAGNOSIS — K5720 Diverticulitis of large intestine with perforation and abscess without bleeding: Secondary | ICD-10-CM | POA: Diagnosis not present

## 2023-03-22 MED ORDER — SIMETHICONE 80 MG PO CHEW: 80.0000 mg | CHEWABLE_TABLET | Freq: Four times a day (QID) | ORAL | Status: DC | PRN

## 2023-03-22 MED ORDER — CALCIUM CARBONATE ANTACID 500 MG PO CHEW
1.0000 | CHEWABLE_TABLET | Freq: Three times a day (TID) | ORAL | Status: DC
  Administered 2023-03-22 – 2023-03-24 (×6): 200 mg via ORAL
  Filled 2023-03-22 (×5): qty 1

## 2023-03-22 NOTE — Progress Notes (Signed)
[place] [facility] PROGRESS NOTE (cpt [phone])  Hospital Day(s): 2.  Interval History: Patient seen and examined, no acute events or new complaints overnight. Patient reports she still has LLQ abdominal pain; improved slightly. No fever, chills, nausea, emesis. She denies fever, chills, nausea, emesis. No new labs this morning. She is on CLD. She continues on Zosyn.   Review of Systems:  Constitutional: denies fever, chills  HEENT: denies cough or congestion  Respiratory: denies any shortness of breath  Cardiovascular: denies chest pain or palpitations  Gastrointestinal: + abdominal pain (LLQ; improving), denied N/V Genitourinary: denies burning with urination or urinary frequency Musculoskeletal: denies pain, decreased motor or sensation   Vital signs in last 24 hours: [min-max] current  Temp:  [97.7 F (36.5 C)-98 F (36.7 C)] 97.7 F (36.5 C) (08/06 0327) Pulse Rate:  [65-78] 67 (08/06 0327) Resp:  [18-20] 20 (08/06 0327) BP: (111-137)/(58-93) 130/58 (08/06 0327) SpO2:  [97 %-99 %] 99 % (08/06 0327)     Height: 5\' 5"  (165.1 cm) Weight: 94.3 kg BMI (Calculated): 34.61   Intake/Output last 2 shifts:  08/05 0701 - 08/06 0700 In: 1500 [P.O.:1500] Out: -    Physical Exam:  Constitutional: alert, cooperative and no distress  HENT: normocephalic without obvious abnormality  Eyes: PERRL, EOM's grossly intact and symmetric Respiratory: breathing non-labored at rest  Cardiovascular: regular rate and sinus rhythm  Gastrointestinal: soft, she is still point tender in LLQ, and non-distended. No rebound/guarding. She is without peritonitis Musculoskeletal: no edema or wounds, motor and sensation grossly intact, NT    Labs:     Latest Ref Rng & Units 03/21/2023    3:41 AM 03/20/2023   11:41 AM 03/16/2023    4:37 PM  CBC  WBC 4.0 - 10.5 K/uL 10.1  11.1  13.5   Hemoglobin 12.0 - 15.0 g/dL 42.5  95.6  38.7   Hematocrit 36.0 - 46.0 % 36.5  41.0  40.7   Platelets 150 -  400 K/uL 277  332  298       Latest Ref Rng & Units 03/21/2023    3:41 AM 03/20/2023   11:41 AM 03/16/2023    4:37 PM  CMP  Glucose 70 - 99 mg/dL 564  332  951   BUN 8 - 23 mg/dL 14  15  17    Creatinine 0.44 - 1.00 mg/dL 8.84  1.66  0.63   Sodium 135 - 145 mmol/L 136  136  134   Potassium 3.5 - 5.1 mmol/L 3.3  3.8  3.8   Chloride 98 - 111 mmol/L 102  100  102   CO2 22 - 32 mmol/L 23  23  22    Calcium 8.9 - 10.3 mg/dL 8.6  9.0  8.7   Total Protein 6.5 - 8.1 g/dL 7.2  7.9  7.8   Total Bilirubin 0.3 - 1.2 mg/dL 0.7  0.5  0.9   Alkaline Phos 38 - 126 U/L 117  118  73   AST 15 - 41 U/L 33  32  25   ALT 0 - 44 U/L 45  47  35      Imaging studies: No new pertinent imaging studies   Assessment/Plan: (ICD-10's: K32.92) 71 y.o. female with recurrent diverticulitis with small abscess   - Okay to continue CLD given persistent pain. Would like yto see this improve some before advancing diet  - Abscess  too small for percutaneous drainage   - No need for emergent surgical intervention; will benefit  from   - Continue IV Abx (Zosyn)  - Monitor abdominal examination; on-going bowel function  - Pain control prn; antiemetics prn  - Mobilize as tolerated   - Further management per primary service; we will follow    All of the above findings and recommendations were discussed with the patient, and the medical team, and all of patient's and family's questions were answered to their expressed satisfaction.  -- Lynden Oxford, PA-C  [facility] 03/22/2023, 7:14 AM M-F: 7am - 4pm

## 2023-03-22 NOTE — TOC CM/SW Note (Signed)
Transition of Care [facility]) - Inpatient Brief Assessment   Patient Details  Name: Joy Vaughan MRN: [id number] Date of Birth: 17-Feb-1951  [facility]) CM/SW Contact:    Margarito Liner, LCSW Phone Number: 03/22/2023, 8:57 AM   Clinical Narrative: CSW reviewed chart. No TOC needs identified. CSW will continue to follow progress. Please place [facility] consult if any needs arise.  Transition of Care Asessment: Insurance and Status: Insurance coverage has been reviewed Patient has primary care physician: Yes Home environment has been reviewed: Single family home Prior level of function:: Not documented Prior/Current Home Services: No current home services Social Determinants of Health Reivew: SDOH reviewed no interventions necessary Readmission risk has been reviewed: Yes Transition of care needs: no transition of care needs at this time

## 2023-03-22 NOTE — Progress Notes (Signed)
  Progress Note   Patient: Joy Vaughan UEA:[id number] DOB: 09-Mar-1951 DOA: 03/20/2023     2 DOS: the patient was seen and examined on 03/22/2023   Brief hospital course: Taken from H&P.  Adasha Daymon is a 72 y.o. female with medical history significant of GERD, hypertension, diverticulosis, sick sinus syndrome status post pacemaker placement presenting with abdominal pain and found to have diverticular abscess.  Patient noted to have been evaluated on July 31 for abdominal pain with formal diagnosis of diverticulitis. Patient was given a course of Augmentin for treatment.   On arrival patient was hemodynamically stable, WBC 11.1. CT imaging today concerning for 1.1 cm sigmoid colon intramural abscess. No evidence of pneumoperitoneum.  General surgery was consulted and patient was started on Zosyn.  Per general surgery abscess is too small for any intervention and they recommend continuation of IV Zosyn at this time.  8/5: Hemodynamically stable, leukocytosis resolved.  General surgery would like to continue IV antibiotics for another day, if remains stable can be discharged tomorrow on Cipro and Flagyl  8/6: Continued to have some LLQ pain and tenderness.  General surgery would like to see no pain before advancing diet so she will remain on CLD and Zosyn.   Assessment and Plan: * Colonic diverticular abscess Failed outpatient course of p.o. antibiotics for recent diagnosis of diverticulitis with noted new 1 x 1 cm posterior sigmoid wall diverticular abscess on imaging. General surgery was consulted-abscess is too small for any intervention. Clinically seems improving.  Still having some LLQ pain and tenderness -Continue with Zosyn for another day, surgery would like to see her without pain before advancing diet and converting to p.o. antibiotics -If continue to improve then can be discharged home tomorrow on Cipro and Flagyl -Continue with supportive care  HTN (hypertension) BP stable Titrate  home regimen  Sick sinus syndrome (HCC) S/p pacemaker placement    GERD (gastroesophageal reflux disease) PPI   Subjective: Patient continued to have mild LLQ discomfort, stating it is slowly improving.  No nausea or vomiting.  Physical Exam: Vitals:   03/21/23 1617 03/21/23 1911 03/22/23 0327 03/22/23 0800  BP: 128/75 (!) 111/93 (!) 130/58 118/60  Pulse: 65 78 67 61  Resp: 18 20 20 18   Temp: 98 F (36.7 C) 97.9 F (36.6 C) 97.7 F (36.5 C) (!) 97.5 F (36.4 C)  TempSrc:  Oral Oral   SpO2: 99% 98% 99% 97%  Weight:      Height:       General.  Obese lady, in no acute distress. Pulmonary.  Lungs clear bilaterally, normal respiratory effort. CV.  Regular rate and rhythm, no JVD, rub or murmur. Abdomen.  Soft, mild LLQ tenderness, nondistended, BS positive. CNS.  Alert and oriented .  No focal neurologic deficit. Extremities.  No edema, no cyanosis, pulses intact and symmetrical. Psychiatry.  Judgment and insight appears normal.    Data Reviewed: Prior data reviewed  Family Communication: Talked with husband on phone.  Disposition: Status is: Inpatient Remains inpatient appropriate because: Severity of illness  Planned Discharge Destination: Home  Time spent: 40 minutes  This record has been created using Conservation officer, historic buildings. Errors have been sought and corrected,but may not always be located. Such creation errors do not reflect on the standard of care.   Author: Arnetha Courser, MD 03/22/2023 2:38 PM  For on call review [email or web address].

## 2023-03-22 NOTE — Assessment & Plan Note (Addendum)
Failed outpatient course of p.o. antibiotics for recent diagnosis of diverticulitis with noted new 1 x 1 cm posterior sigmoid wall diverticular abscess on imaging. General surgery was consulted-abscess is too small for any intervention. Clinically seems improving.  -Advancing diet now -Continue with Zosyn for another day,  -If continue to improve then can be discharged home tomorrow on Cipro and Flagyl -Continue with supportive care

## 2023-03-23 DIAGNOSIS — K5792 Diverticulitis of intestine, part unspecified, without perforation or abscess without bleeding: Secondary | ICD-10-CM | POA: Diagnosis not present

## 2023-03-23 DIAGNOSIS — K5780 Diverticulitis of intestine, part unspecified, with perforation and abscess without bleeding: Secondary | ICD-10-CM | POA: Diagnosis not present

## 2023-03-23 DIAGNOSIS — K5720 Diverticulitis of large intestine with perforation and abscess without bleeding: Secondary | ICD-10-CM | POA: Diagnosis not present

## 2023-03-23 NOTE — Progress Notes (Signed)
  Progress Note   Patient: Joy Vaughan JYN:[id number] DOB: 09/22/1950 DOA: 03/20/2023     3 DOS: the patient was seen and examined on 03/23/2023   Brief hospital course: Taken from H&P.  Akeiba Lubrano is a 72 y.o. female with medical history significant of GERD, hypertension, diverticulosis, sick sinus syndrome status post pacemaker placement presenting with abdominal pain and found to have diverticular abscess.  Patient noted to have been evaluated on July 31 for abdominal pain with formal diagnosis of diverticulitis. Patient was given a course of Augmentin for treatment.   On arrival patient was hemodynamically stable, WBC 11.1. CT imaging today concerning for 1.1 cm sigmoid colon intramural abscess. No evidence of pneumoperitoneum.  General surgery was consulted and patient was started on Zosyn.  Per general surgery abscess is too small for any intervention and they recommend continuation of IV Zosyn at this time.  8/5: Hemodynamically stable, leukocytosis resolved.  General surgery would like to continue IV antibiotics for another day, if remains stable can be discharged tomorrow on Cipro and Flagyl  8/6: Continued to have some LLQ pain and tenderness.  General surgery would like to see no pain before advancing diet so she will remain on CLD and Zosyn.  8/7: Vitals and labs stable.  Diet advanced to full liqui, will advance to GI soft for dinner and if able to tolerate then she can be discharged tomorrow on p.o. antibiotic   Assessment and Plan: * Colonic diverticular abscess Failed outpatient course of p.o. antibiotics for recent diagnosis of diverticulitis with noted new 1 x 1 cm posterior sigmoid wall diverticular abscess on imaging. General surgery was consulted-abscess is too small for any intervention. Clinically seems improving.  -Advancing diet now -Continue with Zosyn for another day,  -If continue to improve then can be discharged home tomorrow on Cipro and Flagyl -Continue with  supportive care  HTN (hypertension) BP stable Titrate home regimen  Sick sinus syndrome (HCC) S/p pacemaker placement    GERD (gastroesophageal reflux disease) PPI   Subjective: Patient continued to have mild left lower quadrant discomfort but stating it is improving.  Had a normal bowel movement.  Tolerating full liquid diet.  Physical Exam: Vitals:   03/22/23 1545 03/23/23 0546 03/23/23 0736 03/23/23 1525  BP: (!) 146/75 (!) 151/83 (!) 157/73 (!) 140/70  Pulse: 73 61 67 69  Resp: 16 16 18 18   Temp: 98.3 F (36.8 C) (!) 97.4 F (36.3 C) 97.7 F (36.5 C) 98 F (36.7 C)  TempSrc: Oral Oral    SpO2: 98% 99% 98% 100%  Weight:      Height:       General. Obese lady,In no acute distress. Pulmonary.  Lungs clear bilaterally, normal respiratory effort. CV.  Regular rate and rhythm, no JVD, rub or murmur. Abdomen.  Soft, nontender, nondistended, BS positive. CNS.  Alert and oriented .  No focal neurologic deficit. Extremities.  No edema, no cyanosis, pulses intact and symmetrical. Psychiatry.  Judgment and insight appears normal.   Data Reviewed: Prior data reviewed  Family Communication: Discussed with husband at bedside  Disposition: Status is: Inpatient Remains inpatient appropriate because: Severity of illness  Planned Discharge Destination: Home  Time spent: 39 minutes  This record has been created using Conservation officer, historic buildings. Errors have been sought and corrected,but may not always be located. Such creation errors do not reflect on the standard of care.   Author: Arnetha Courser, MD 03/23/2023 3:55 PM  For on call review [email or web address].

## 2023-03-23 NOTE — Progress Notes (Signed)
Pt refused Lovenox. Pt ambulates in hallway independently. On-call provider notified. Pt educated and she verbalized understanding.

## 2023-03-23 NOTE — Care Management Important Message (Signed)
Important Message  Patient Details  Name: Joy Vaughan MRN: [id number] Date of Birth: Jan 14, 1951   Medicare Important Message Given:  N/A - LOS <3 / Initial given by admissions     Johnell Comings 03/23/2023, 8:10 AM

## 2023-03-23 NOTE — Progress Notes (Signed)
[facility] SURGICAL PROGRESS NOTE (cpt [phone])  Hospital Day(s): 3.  Interval History: Patient seen and examined, no acute events or new complaints overnight. Patient reports she is feeling better; LLQ pain improving. No fever, chills, nausea, emesis. She denies fever, chills, nausea, emesis. She remains without leukocytosis; 6.7K. Hgb to 11.1. Renal function remains normal; sCr - 0.78; UO - unmeasured. No electrolyte derangements. She is on CLD. She continues on Zosyn.   Review of Systems:  Constitutional: denies fever, chills  HEENT: denies cough or congestion  Respiratory: denies any shortness of breath  Cardiovascular: denies chest pain or palpitations  Gastrointestinal: + abdominal pain (LLQ; improving), denied N/V Genitourinary: denies burning with urination or urinary frequency Musculoskeletal: denies pain, decreased motor or sensation   Vital signs in last 24 hours: [min-max] current  Temp:  [97.4 F (36.3 C)-98.3 F (36.8 C)] 97.4 F (36.3 C) (08/07 0546) Pulse Rate:  [61-73] 61 (08/07 0546) Resp:  [16-18] 16 (08/07 0546) BP: (118-151)/(60-83) 151/83 (08/07 0546) SpO2:  [97 %-99 %] 99 % (08/07 0546)     Height: 5\' 5"  (165.1 cm) Weight: 94.3 kg BMI (Calculated): 34.61   Intake/Output last 2 shifts:  08/06 0701 - 08/07 0700 In: 2280 [P.O.:2280] Out: -    Physical Exam:  Constitutional: alert, cooperative and no distress  HENT: normocephalic without obvious abnormality  Eyes: PERRL, EOM's grossly intact and symmetric Respiratory: breathing non-labored at rest  Cardiovascular: regular rate and sinus rhythm  Gastrointestinal: soft, point tenderness in LLQ improving significantly compared to prior examinations, and non-distended. No rebound/guarding. She is without peritonitis Musculoskeletal: no edema or wounds, motor and sensation grossly intact, NT    Labs:     Latest Ref Rng & Units 03/23/2023    4:12 AM 03/21/2023    3:41 AM 03/20/2023   11:41 AM   CBC  WBC 4.0 - 10.5 K/uL 6.7  10.1  11.1   Hemoglobin 12.0 - 15.0 g/dL 95.6  21.3  08.6   Hematocrit 36.0 - 46.0 % 33.1  36.5  41.0   Platelets 150 - 400 K/uL 278  277  332       Latest Ref Rng & Units 03/23/2023    4:12 AM 03/21/2023    3:41 AM 03/20/2023   11:41 AM  CMP  Glucose 70 - 99 mg/dL 578  469  629   BUN 8 - 23 mg/dL 7  14  15    Creatinine 0.44 - 1.00 mg/dL 5.28  4.13  2.44   Sodium 135 - 145 mmol/L 138  136  136   Potassium 3.5 - 5.1 mmol/L 3.5  3.3  3.8   Chloride 98 - 111 mmol/L 109  102  100   CO2 22 - 32 mmol/L 22  23  23    Calcium 8.9 - 10.3 mg/dL 8.5  8.6  9.0   Total Protein 6.5 - 8.1 g/dL  7.2  7.9   Total Bilirubin 0.3 - 1.2 mg/dL  0.7  0.5   Alkaline Phos 38 - 126 U/L  117  118   AST 15 - 41 U/L  33  32   ALT 0 - 44 U/L  45  47      Imaging studies: No new pertinent imaging studies   Assessment/Plan: (ICD-10's: K21.92) 72 y.o. female with clinically improving recurrent diverticulitis with small abscess   - Okay to advance to FLD; advance as tolerated today/tomorrow  - Okay to discontinue IVF  - No need for emergent surgical  intervention; will benefit from evaluation for elective sigmoid colectomy as outpatient once recovered from this insult.   - Continue IV Abx (Zosyn)  - Monitor abdominal examination; on-going bowel function  - Pain control prn; antiemetics prn  - Mobilize as tolerated   - Okay to resume home medications   - Further management per primary service; we will follow     - Discharge Planning: Clinically improving; will begin advancement of diet. Hopefully home in next 24 hours.   All of the above findings and recommendations were discussed with the patient, and the medical team, and all of patient's and family's questions were answered to their expressed satisfaction.  -- Lynden Oxford, PA-C [facility] 03/23/2023, 7:33 AM M-F: 7am - 4pm

## 2023-03-24 DIAGNOSIS — K5720 Diverticulitis of large intestine with perforation and abscess without bleeding: Secondary | ICD-10-CM | POA: Diagnosis not present

## 2023-03-24 DIAGNOSIS — K5780 Diverticulitis of intestine, part unspecified, with perforation and abscess without bleeding: Secondary | ICD-10-CM | POA: Diagnosis not present

## 2023-03-24 DIAGNOSIS — K5792 Diverticulitis of intestine, part unspecified, without perforation or abscess without bleeding: Secondary | ICD-10-CM | POA: Diagnosis not present

## 2023-03-24 DIAGNOSIS — K219 Gastro-esophageal reflux disease without esophagitis: Secondary | ICD-10-CM | POA: Diagnosis not present

## 2023-03-24 DIAGNOSIS — I10 Essential (primary) hypertension: Secondary | ICD-10-CM | POA: Diagnosis not present

## 2023-03-24 MED ORDER — METRONIDAZOLE 500 MG PO TABS: 500.0000 mg | ORAL_TABLET | Freq: Three times a day (TID) | ORAL | 0 refills | Status: AC

## 2023-03-24 MED ORDER — CIPROFLOXACIN HCL 500 MG PO TABS: 500.0000 mg | ORAL_TABLET | Freq: Two times a day (BID) | ORAL | 0 refills | Status: AC

## 2023-03-24 MED ORDER — LOSARTAN POTASSIUM 50 MG PO TABS
100.0000 mg | ORAL_TABLET | Freq: Every day | ORAL | Status: DC
  Filled 2023-03-24: qty 2

## 2023-03-24 NOTE — Progress Notes (Signed)
[facility] SURGICAL PROGRESS NOTE (cpt [phone])  Hospital Day(s): 4.  Interval History: Patient seen and examined, no acute events or new complaints overnight. Patient reports she is feeling better; LLQ improving daily; now minimal. No fever, chills, nausea, emesis. No new labs. She is on soft diet; tolerating. She continues on Zosyn.   Review of Systems:  Constitutional: denies fever, chills  HEENT: denies cough or congestion  Respiratory: denies any shortness of breath  Cardiovascular: denies chest pain or palpitations  Gastrointestinal: + abdominal pain (LLQ; improving), denied N/V Genitourinary: denies burning with urination or urinary frequency Musculoskeletal: denies pain, decreased motor or sensation   Vital signs in last 24 hours: [min-max] current  Temp:  [98 F (36.7 C)-98.1 F (36.7 C)] 98.1 F (36.7 C) (08/08 0601) Pulse Rate:  [63-69] 68 (08/08 0601) Resp:  [18] 18 (08/08 0601) BP: (140-163)/(69-83) 143/69 (08/08 0601) SpO2:  [98 %-100 %] 98 % (08/08 0601)     Height: 5\' 5"  (165.1 cm) Weight: 94.3 kg BMI (Calculated): 34.61   Intake/Output last 2 shifts:  08/07 0701 - 08/08 0700 In: 960 [P.O.:960] Out: -    Physical Exam:  Constitutional: alert, cooperative and no distress  HENT: normocephalic without obvious abnormality  Eyes: PERRL, EOM's grossly intact and symmetric Respiratory: breathing non-labored at rest  Cardiovascular: regular rate and sinus rhythm  Gastrointestinal: soft, very minimal point tenderness in LLQ improving significantly compared to prior examinations, and non-distended. No rebound/guarding. She is without peritonitis Musculoskeletal: no edema or wounds, motor and sensation grossly intact, NT    Labs:     Latest Ref Rng & Units 03/23/2023    4:12 AM 03/21/2023    3:41 AM 03/20/2023   11:41 AM  CBC  WBC 4.0 - 10.5 K/uL 6.7  10.1  11.1   Hemoglobin 12.0 - 15.0 g/dL 46.9  62.9  52.8   Hematocrit 36.0 - 46.0 % 33.1  36.5  41.0    Platelets 150 - 400 K/uL 278  277  332       Latest Ref Rng & Units 03/23/2023    4:12 AM 03/21/2023    3:41 AM 03/20/2023   11:41 AM  CMP  Glucose 70 - 99 mg/dL 413  244  010   BUN 8 - 23 mg/dL 7  14  15    Creatinine 0.44 - 1.00 mg/dL 2.72  5.36  6.44   Sodium 135 - 145 mmol/L 138  136  136   Potassium 3.5 - 5.1 mmol/L 3.5  3.3  3.8   Chloride 98 - 111 mmol/L 109  102  100   CO2 22 - 32 mmol/L 22  23  23    Calcium 8.9 - 10.3 mg/dL 8.5  8.6  9.0   Total Protein 6.5 - 8.1 g/dL  7.2  7.9   Total Bilirubin 0.3 - 1.2 mg/dL  0.7  0.5   Alkaline Phos 38 - 126 U/L  117  118   AST 15 - 41 U/L  33  32   ALT 0 - 44 U/L  45  47      Imaging studies: No new pertinent imaging studies   Assessment/Plan: (ICD-10's: K63.92) 72 y.o. female with clinically improving recurrent diverticulitis with small abscess   - Okay to continue soft diet; reviewed dietary recommendations for home  - No need for emergent surgical intervention; will benefit from evaluation for elective sigmoid colectomy as outpatient once recovered from this insult.   - Continue IV Abx (Zosyn);  Cipro/Flagyl for home to complete 14 days total (IV + PO)  - Monitor abdominal examination; on-going bowel function  - Pain control prn; antiemetics prn  - Mobilize as tolerated   - Further management per primary service;   - Discharge Planning: Okay for discharge from surgical perspective. Abx as above. We will follow up in 2-3 weeks in the office.   All of the above findings and recommendations were discussed with the patient, and the medical team, and all of patient's and family's questions were answered to their expressed satisfaction.  -- Lynden Oxford, PA-C [facility] 03/24/2023, 7:43 AM M-F: 7am - 4pm

## 2023-03-24 NOTE — Discharge Summary (Signed)
Physician Discharge Summary   Patient: Joy Vaughan MRN: [id number] DOB: 1951/07/25  Admit date:     03/20/2023  Discharge date: 03/24/23  Discharge Physician: Arnetha Courser   PCP: Marisue Ivan, MD   Recommendations at discharge:  Please obtain CBC and BMP in 1 week Patient is being discharged on Cipro and Flagyl to complete a total of 14 days course-please ensure that she completed antibiotics Follow-up with general surgery Follow-up with primary care provider  Discharge Diagnoses: Principal Problem:   Colonic diverticular abscess Active Problems:   HTN (hypertension)   Sick sinus syndrome (HCC)   GERD (gastroesophageal reflux disease)   Hospital Course: Taken from H&P.  Joy Vaughan is a 72 y.o. female with medical history significant of GERD, hypertension, diverticulosis, sick sinus syndrome status post pacemaker placement presenting with abdominal pain and found to have diverticular abscess.  Patient noted to have been evaluated on July 31 for abdominal pain with formal diagnosis of diverticulitis. Patient was given a course of Augmentin for treatment.   On arrival patient was hemodynamically stable, WBC 11.1. CT imaging today concerning for 1.1 cm sigmoid colon intramural abscess. No evidence of pneumoperitoneum.  General surgery was consulted and patient was started on Zosyn.  Per general surgery abscess is too small for any intervention and they recommend continuation of IV Zosyn at this time.  8/5: Hemodynamically stable, leukocytosis resolved.  General surgery would like to continue IV antibiotics for another day, if remains stable can be discharged tomorrow on Cipro and Flagyl  8/6: Continued to have some LLQ pain and tenderness.  General surgery would like to see no pain before advancing diet so she will remain on CLD and Zosyn.  8/7: Vitals and labs stable.  Diet advanced to full liqui, will advance to GI soft for dinner   8/8: Patient remained hemodynamically  stable, able to tolerate advancement in diet.  She is being discharged on Cipro and Flagyl for a total of 14-day course.  She will follow-up with general surgery for further recommendations.  Patient will continue on her current medications and need to have a close follow-up with her providers.  Assessment and Plan: * Colonic diverticular abscess Failed outpatient course of p.o. antibiotics for recent diagnosis of diverticulitis with noted new 1 x 1 cm posterior sigmoid wall diverticular abscess on imaging. General surgery was consulted-abscess is too small for any intervention. Clinically seems improving.  -Advancing diet now -Continue with Zosyn for another day,  -If continue to improve then can be discharged home tomorrow on Cipro and Flagyl -Continue with supportive care  HTN (hypertension) BP stable Titrate home regimen  Sick sinus syndrome (HCC) S/p pacemaker placement    GERD (gastroesophageal reflux disease) PPI   Consultants: General Surgery Procedures performed: None Disposition: Home Diet recommendation:  Discharge Diet Orders (From admission, onward)     Start     Ordered   03/24/23 0000  Diet - low sodium heart healthy        03/24/23 1035           Cardiac diet DISCHARGE MEDICATION: Allergies as of 03/24/2023       Reactions   Ezetimibe    Other reaction(s): Muscle Pain   Statins    Other reaction(s): Muscle Pain   Sulfa Antibiotics Itching, Rash        Medication List     STOP taking these medications    amoxicillin-clavulanate 875-125 MG tablet Commonly known as: AUGMENTIN       TAKE  these medications    acetaminophen 500 MG tablet Commonly known as: TYLENOL Take 500 mg by mouth every 6 (six) hours as needed for mild pain, headache or fever.   Calcium Carb-Cholecalciferol 600-400 MG-UNIT Tabs Take 1 tablet by mouth daily.   Cholecalciferol 25 MCG (1000 UT) tablet Take 1,000 Units by mouth daily.   ciprofloxacin 500 MG  tablet Commonly known as: Cipro Take 1 tablet (500 mg total) by mouth 2 (two) times daily for 10 days.   losartan-hydrochlorothiazide 100-25 MG tablet Commonly known as: HYZAAR Take 1 tablet by mouth daily.   magnesium oxide 400 (240 Mg) MG tablet Commonly known as: MAG-OX Take 400 mg by mouth daily.   metoprolol succinate 25 MG 24 hr tablet Commonly known as: TOPROL-XL Take 12.5 mg by mouth daily.   metroNIDAZOLE 500 MG tablet Commonly known as: Flagyl Take 1 tablet (500 mg total) by mouth 3 (three) times daily for 10 days.   omeprazole 20 MG capsule Commonly known as: PRILOSEC Take 20 mg by mouth daily.   PARoxetine 20 MG tablet Commonly known as: PAXIL Take 20 mg by mouth daily.        Follow-up Information     P[place]on, [place] F, MD Follow up in 3 week(s).   Specialty: General Surgery Why: hospital follow up; diverticulitis Contact information: [address] [place] [address] [phone]         Marisue Ivan, MD. Schedule an appointment as soon as possible for a visit in 1 week(s).   Specialty: Family Medicine Contact information: [address] [facility] [place] [address] [phone]                Discharge Exam: Ceasar Mons Weights   03/20/23 1139  Weight: 94.3 kg   General.  Obese elderly lady, in no acute distress. Pulmonary.  Lungs clear bilaterally, normal respiratory effort. CV.  Regular rate and rhythm, no JVD, rub or murmur. Abdomen.  Soft, nontender, nondistended, BS positive. CNS.  Alert and oriented .  No focal neurologic deficit. Extremities.  No edema, no cyanosis, pulses intact and symmetrical. Psychiatry.  Judgment and insight appears normal.   Condition at discharge: stable  The results of significant diagnostics from this hospitalization (including imaging, microbiology, ancillary and laboratory) are listed below for reference.   Imaging Studies: CT ABDOMEN PELVIS W  CONTRAST  Result Date: 03/20/2023 CLINICAL DATA:  Diverticulitis, complication suspected EXAM: CT ABDOMEN AND PELVIS WITH CONTRAST TECHNIQUE: Multidetector CT imaging of the abdomen and pelvis was performed using the standard protocol following bolus administration of intravenous contrast. RADIATION DOSE REDUCTION: This exam was performed according to the departmental dose-optimization program which includes automated exposure control, adjustment of the mA and/or kV according to patient size and/or use of iterative reconstruction technique. CONTRAST:  OMNIPAQUE IOHEXOL 300 MG/ML  SOLN COMPARISON:  CT AP 03/16/23 FINDINGS: Lower chest: Lung bases are clear. Hepatobiliary: Liver has a normal contour. Contrast-enhancing lesion in the peripheral aspect of the right hepatic lobe is favored to represent a small flash filling hemangioma. No evidence of perihepatic fluid. No intra or extrahepatic biliary ductal dilatation. Pancreas: No evidence of peripancreatic fat stranding to suggest pancreatitis. Spleen: Unchanged indeterminate lesions in the upper and lower aspect of the spleen measuring up to 3.6 x 2.6 cm and 2.9 x 2.3 cm, respectively. Adrenals/Urinary Tract: Bilateral adrenal glands are normal in appearance. Bilateral kidneys enhance homogeneously and symmetrically. No evidence of hydronephrosis or nephrolithiasis. The urinary bladder is fluid-filled,  but the degree of streak artifact from bilateral hip arthroplasties limits evaluation. Stomach/Bowel: No evidence of bowel obstruction. There is diverticulosis with redemonstration of diverticulitis of the descending/sigmoid colon junction. Compared to prior exam there is a new low-density lesion along the posterior wall of the sigmoid colon measuring 1.1 x 1.1 cm (series 4, image 65), favored to represent an intramural abscess. No evidence of pneumoperitoneum. The appendix is not well visualized. Vascular/Lymphatic: No significant vascular findings are present.  No enlarged abdominal or pelvic lymph nodes. Reproductive: Uterus and bilateral adnexa are unremarkable. Other: No abdominal wall hernia or abnormality. No abdominopelvic ascites. Musculoskeletal: No acute or significant osseous findings. IMPRESSION: Redemonstration of diverticulitis at the descending/sigmoid colon junction. Compared to prior exam there is a new low-density lesion along the posterior wall of the sigmoid colon measuring 1.1 x 1.1 cm, favored to represent an intramural abscess. No evidence of pneumoperitoneum. Electronically Signed   By: Lorenza Cambridge M.D.   On: 03/20/2023 15:43   CT ABDOMEN PELVIS W CONTRAST  Result Date: 03/16/2023 CLINICAL DATA:  Left lower quadrant abdominal pain EXAM: CT ABDOMEN AND PELVIS WITH CONTRAST TECHNIQUE: Multidetector CT imaging of the abdomen and pelvis was performed using the standard protocol following bolus administration of intravenous contrast. RADIATION DOSE REDUCTION: This exam was performed according to the departmental dose-optimization program which includes automated exposure control, adjustment of the mA and/or kV according to patient size and/or use of iterative reconstruction technique. CONTRAST:  OMNIPAQUE IOHEXOL 300 MG/ML  SOLN COMPARISON:  CT abdomen and pelvis 12/25/2021 FINDINGS: Lower chest: No acute abnormality. Hepatobiliary: The liver is enlarged. There is diffuse fatty infiltration of the liver. Gallbladder and bile ducts are within normal limits. Pancreas: Unremarkable. No pancreatic ductal dilatation or surrounding inflammatory changes. Spleen: There are 2 cystic areas in the spleen with thin peripheral calcifications measuring up to 3.5 cm similar to the prior study. Spleen is normal in size. Adrenals/Urinary Tract: Bladder is not well visualized secondary to streak artifact in the pelvis. The kidneys and adrenal glands are within normal limits. Stomach/Bowel: There is sigmoid colon diverticulosis. There is mid sigmoid colon wall  thickening and inflammation compatible with acute diverticulitis. There is no evidence for perforation or abscess. There is no bowel obstruction. The appendix appears within normal limits. Small bowel and stomach are within normal limits. Duodenal diverticulum again noted. Vascular/Lymphatic: Aortic atherosclerosis. No enlarged abdominal or pelvic lymph nodes. Reproductive: Status post hysterectomy. No adnexal masses. Other: No abdominal wall hernia or abnormality. No abdominopelvic ascites. 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 No acute fractures. IMPRESSION: 1. Acute uncomplicated sigmoid colon diverticulitis. 2. Hepatomegaly with fatty infiltration of the liver. 3. Aortic atherosclerosis. Aortic Atherosclerosis (ICD10-I70.0). Electronically Signed   By: Darliss Cheney M.D.   On: 03/16/2023 18:07    Microbiology: Results for orders placed or performed in visit on 05/27/21  Microscopic Examination     Status: None   Collection Time: 05/27/21  2:32 PM   Urine  Result Value Ref Range Status   WBC, UA None seen 0 - 5 /hpf Final   RBC, Urine 0-2 0 - 2 /hpf Final   Epithelial Cells (non renal) 0-10 0 - 10 /hpf Final   Bacteria, UA None seen None seen/Few Final    Labs: CBC: Recent Labs  Lab 03/20/23 1141 03/21/23 0341 03/23/23 0412  WBC 11.1* 10.1 6.7  HGB 13.7 12.5 11.1*  HCT 41.0 36.5 33.1*  MCV 92.8 89.2 89.5  PLT 332 277 278   Basic  Metabolic Panel: Recent Labs  Lab 03/20/23 1141 03/21/23 0341 03/23/23 0412  NA 136 136 138  K 3.8 3.3* 3.5  CL 100 102 109  CO2 23 23 22   GLUCOSE 116* 103* 108*  BUN 15 14 7*  CREATININE 0.85 0.78 0.78  CALCIUM 9.0 8.6* 8.5*   Liver Function Tests: Recent Labs  Lab 03/20/23 1141 03/21/23 0341  AST 32 33  ALT 47* 45*  ALKPHOS 118 117  BILITOT 0.5 0.7  PROT 7.9 7.2  ALBUMIN 4.0 3.6   CBG: No results for input(s): "GLUCAP" in the last 168 hours.  Discharge time spent: greater than 30 minutes.  This record has been  created using Conservation officer, historic buildings. Errors have been sought and corrected,but may not always be located. Such creation errors do not reflect on the standard of care.   Signed: Arnetha Courser, MD [facility] 03/24/2023

## 2023-03-24 NOTE — Progress Notes (Signed)
Discharge instructions reviewed with the patient. IV removed. Patient sent out via wheelchair to her waiting  ride 

## 2023-03-24 NOTE — Discharge Instructions (Signed)
In addition to included general post-operative instructions,  Diet: Resume home diet. Recommend a low fiber diet for about 2 weeks and then transitioning to a high fiber diet, maintaining hydration, and avoiding constipation   Call office [phone] - surgery) at any time if any questions, worsening pain, fevers/chills, or other concerns.

## 2023-03-28 ENCOUNTER — Encounter: Payer: Self-pay | Admitting: Family Medicine

## 2023-03-28 ENCOUNTER — Ambulatory Visit: Payer: Medicare Other | Admitting: Family Medicine

## 2023-03-28 VITALS — BP 124/70 | HR 72 | Temp 98.0°F | Resp 16 | Ht 65.0 in | Wt 209.0 lb

## 2023-03-28 DIAGNOSIS — K5720 Diverticulitis of large intestine with perforation and abscess without bleeding: Secondary | ICD-10-CM | POA: Diagnosis not present

## 2023-03-28 DIAGNOSIS — F39 Unspecified mood [affective] disorder: Secondary | ICD-10-CM

## 2023-03-28 DIAGNOSIS — E038 Other specified hypothyroidism: Secondary | ICD-10-CM

## 2023-03-28 DIAGNOSIS — E039 Hypothyroidism, unspecified: Secondary | ICD-10-CM

## 2023-03-28 DIAGNOSIS — E538 Deficiency of other specified B group vitamins: Secondary | ICD-10-CM | POA: Diagnosis not present

## 2023-03-28 DIAGNOSIS — E559 Vitamin D deficiency, unspecified: Secondary | ICD-10-CM

## 2023-03-28 DIAGNOSIS — I10 Essential (primary) hypertension: Secondary | ICD-10-CM

## 2023-03-28 DIAGNOSIS — Z1159 Encounter for screening for other viral diseases: Secondary | ICD-10-CM

## 2023-03-28 DIAGNOSIS — I5181 Takotsubo syndrome: Secondary | ICD-10-CM

## 2023-03-28 DIAGNOSIS — D509 Iron deficiency anemia, unspecified: Secondary | ICD-10-CM | POA: Diagnosis not present

## 2023-03-28 DIAGNOSIS — I495 Sick sinus syndrome: Secondary | ICD-10-CM

## 2023-03-28 NOTE — Progress Notes (Signed)
SUBJECTIVE:   Chief Complaint  Patient presents with   Establish Care   HPI Presents to establish care  Recent hospitalization for Diverticulitis at [facility]. Has follow up with Surgery 08/29.  Was discharged on Cipro and Flagyl x 14 days.  Symptoms slightly improved since discharge.    HTN Asymptomatic. Takes Hyzaar 100-25 mg daily and Metoprolol XR 12.5 mg daily.  Follows with Cardiology, Dr Darrold Junker at [facility].   Sick Sinus Syndrome Taking Metoprolol XR 12.5 mg daily.  Follows with Cardiology.  Per recent note 01/2023, patient had been off BB since insertion of pacemaker but appears she remains taking.    Requesting Ambien for sleep Was prescribed by former PCP in 2022.  Last filled 11/22.  Previous history of OD on sleeping pills at rest stop in [place], [place] 10/11/21 per Cardiology note from 01/2023.  Mood disorder Takes Paxil 20 mg daily. Not followed by psychiatry.  Had a previous admission to inpatient psychiatry in [place], [place] for OD on sleeping pills.   Denies any SI/HI.    PERTINENT PMH / PSH: HTN Takotsubo's Cardiomyopathy Mood Disorder   OBJECTIVE:  BP 124/70   Pulse 72   Temp 98 F (36.7 C)   Resp 16   Ht 5\' 5"  (1.651 m)   Wt 209 lb (94.8 kg)   SpO2 93%   BMI 34.78 kg/m    Physical Exam Vitals reviewed.  Constitutional:      General: She is not in acute distress.    Appearance: She is not ill-appearing.  HENT:     Head: Normocephalic.     Right Ear: Tympanic membrane, ear canal and external ear normal.     Left Ear: Tympanic membrane, ear canal and external ear normal.     Nose: Nose normal.     Mouth/Throat:     Mouth: Mucous membranes are moist.  Eyes:     Extraocular Movements: Extraocular movements intact.     Conjunctiva/sclera: Conjunctivae normal.     Pupils: Pupils are equal, round, and reactive to light.  Neck:     Vascular: No carotid bruit.  Cardiovascular:     Rate and Rhythm: Normal rate and regular rhythm.     Pulses: Normal  pulses.     Heart sounds: Normal heart sounds.  Pulmonary:     Effort: Pulmonary effort is normal.     Breath sounds: Normal breath sounds.  Abdominal:     General: Bowel sounds are normal. There is no distension.     Palpations: Abdomen is soft.     Tenderness: There is no abdominal tenderness. There is no right CVA tenderness, left CVA tenderness, guarding or rebound.  Musculoskeletal:        General: Normal range of motion.     Cervical back: Normal range of motion.     Right lower leg: No edema.     Left lower leg: No edema.  Lymphadenopathy:     Cervical: No cervical adenopathy.  Skin:    Capillary Refill: Capillary refill takes less than 2 seconds.  Neurological:     General: No focal deficit present.     Mental Status: She is alert and oriented to person, place, and time. Mental status is at baseline.     Motor: No weakness.  Psychiatric:        Mood and Affect: Mood normal.        Behavior: Behavior normal.        Thought Content: Thought content normal.  Judgment: Judgment normal.        03/28/2023    2:21 PM  Depression screen PHQ 2/9  Decreased Interest 0  Down, Depressed, Hopeless 0  PHQ - 2 Score 0  Altered sleeping 3  Tired, decreased energy 1  Change in appetite 1  Feeling bad or failure about yourself  0  Trouble concentrating 1  Moving slowly or fidgety/restless 0  Suicidal thoughts 0  PHQ-9 Score 6  Difficult doing work/chores Not difficult at all      03/28/2023    2:21 PM  GAD 7 : Generalized Anxiety Score  Nervous, Anxious, on Edge 0  Control/stop worrying 0  Worry too much - different things 0  Trouble relaxing 0  Restless 0  Easily annoyed or irritable 0  Afraid - awful might happen 0  Total GAD 7 Score 0  Anxiety Difficulty Not difficult at all    ASSESSMENT/PLAN:  Primary hypertension Assessment & Plan: Chronic Well controlled on Hyzaar 100-25 mg daily Follows with [facility] Cardiology   Colonic diverticular  abscess Assessment & Plan: Hospital follow up No signs of worsening abdominal pain Continues to take Cipro and Flagyl Has follow up with surgery scheduled 08/29 Obtain CBC and Bmet today Strict ED precautions provided  Orders: -     CBC -     Basic metabolic panel  Iron deficiency anemia, unspecified iron deficiency anemia type -     IBC + Ferritin  Mood disorder (HCC) Assessment & Plan: Chronic Past OD with sleeping pills. Requesting Ambien today.  Given history do not think this is appropriate treatment.  Recommend sleep hygiene  Currently takes Paxil 20 mg daily Recommend psychiatry referral. Declined at this time.    Sick sinus syndrome (HCC) Assessment & Plan: Chronic S/p pacemaker Takes Metoprolol XL 12.5 mg daily Follows with [facility] Cardiology   Vitamin B 12 deficiency -     Vitamin B12  Vitamin D deficiency -     VITAMIN D 25 Hydroxy (Vit-D Deficiency, Fractures)  Acquired hypothyroidism Assessment & Plan: Chronic Not currently on medication Asymptomatic Check TSH   Subclinical hypothyroidism -     TSH  Takotsubo cardiomyopathy Assessment & Plan: Diagnosed in 2023 Follows with Cardiology, [facility]    PDMP reviewed  Return in about 6 months (around 09/28/2023), or if symptoms worsen or fail to improve, for PCP.  Dana Allan, MD

## 2023-03-28 NOTE — Patient Instructions (Signed)
It was a pleasure meeting you today. Thank you for allowing me to take part in your health care.  Our goals for today as we discussed include:  We will get some labs today.  If they are abnormal or we need to do something about them, I will call you.  If they are normal, I will send you a message on MyChart (if it is active) or a letter in the mail.  If you don't hear from [place] in 2 weeks, please call the office at the number below.   Recommend Mammogram  Recommend Tetanus Vaccination.  This is given every 10 years.   Recommend Shingles vaccine.  This is a 2 dose series and can be given at your local pharmacy.  Please talk to your pharmacist about this.   Recommend Pneumonia 20 Vaccination.   Schedule Medicare Annual Wellness Visit   If you have any questions or concerns, please do not hesitate to call the office at [phone].  I look forward to our next visit and until then take care and stay safe.  Regards,   Dana Allan, MD   [facility]

## 2023-03-30 ENCOUNTER — Emergency Department: Payer: Medicare Other

## 2023-03-30 ENCOUNTER — Other Ambulatory Visit: Payer: Self-pay

## 2023-03-30 ENCOUNTER — Encounter: Payer: Self-pay | Admitting: Emergency Medicine

## 2023-03-30 ENCOUNTER — Observation Stay
Admission: EM | Admit: 2023-03-30 | Discharge: 2023-03-31 | Disposition: A | Discharge status: Home / Self Care | Payer: Medicare Other | Attending: Internal Medicine | Admitting: Internal Medicine

## 2023-03-30 DIAGNOSIS — R1032 Left lower quadrant pain: Secondary | ICD-10-CM | POA: Insufficient documentation

## 2023-03-30 DIAGNOSIS — Z6830 Body mass index (BMI) 30.0-30.9, adult: Secondary | ICD-10-CM | POA: Diagnosis not present

## 2023-03-30 DIAGNOSIS — E039 Hypothyroidism, unspecified: Secondary | ICD-10-CM | POA: Diagnosis not present

## 2023-03-30 DIAGNOSIS — K5792 Diverticulitis of intestine, part unspecified, without perforation or abscess without bleeding: Secondary | ICD-10-CM | POA: Diagnosis present

## 2023-03-30 DIAGNOSIS — I10 Essential (primary) hypertension: Secondary | ICD-10-CM | POA: Diagnosis present

## 2023-03-30 DIAGNOSIS — E669 Obesity, unspecified: Secondary | ICD-10-CM | POA: Insufficient documentation

## 2023-03-30 DIAGNOSIS — I420 Dilated cardiomyopathy: Secondary | ICD-10-CM | POA: Diagnosis not present

## 2023-03-30 DIAGNOSIS — R112 Nausea with vomiting, unspecified: Secondary | ICD-10-CM | POA: Diagnosis not present

## 2023-03-30 DIAGNOSIS — F419 Anxiety disorder, unspecified: Secondary | ICD-10-CM | POA: Insufficient documentation

## 2023-03-30 DIAGNOSIS — K219 Gastro-esophageal reflux disease without esophagitis: Secondary | ICD-10-CM | POA: Diagnosis present

## 2023-03-30 DIAGNOSIS — R109 Unspecified abdominal pain: Secondary | ICD-10-CM | POA: Diagnosis present

## 2023-03-30 DIAGNOSIS — Z950 Presence of cardiac pacemaker: Secondary | ICD-10-CM | POA: Insufficient documentation

## 2023-03-30 DIAGNOSIS — I471 Supraventricular tachycardia, unspecified: Secondary | ICD-10-CM | POA: Insufficient documentation

## 2023-03-30 DIAGNOSIS — F39 Unspecified mood [affective] disorder: Secondary | ICD-10-CM | POA: Insufficient documentation

## 2023-03-30 LAB — URINALYSIS, ROUTINE W REFLEX MICROSCOPIC
Bacteria, UA: NONE SEEN
Bilirubin Urine: NEGATIVE
Glucose, UA: NEGATIVE mg/dL
Hgb urine dipstick: NEGATIVE
Ketones, ur: NEGATIVE mg/dL
Nitrite: NEGATIVE
Protein, ur: NEGATIVE mg/dL
Specific Gravity, Urine: 1.016 (ref 1.005–1.030)
pH: 6 (ref 5.0–8.0)

## 2023-03-30 LAB — CBC
HCT: 38.1 % (ref 36.0–46.0)
Hemoglobin: 12.9 g/dL (ref 12.0–15.0)
MCH: 30.6 pg (ref 26.0–34.0)
MCHC: 33.9 g/dL (ref 30.0–36.0)
MCV: 90.5 fL (ref 80.0–100.0)
Platelets: 350 10*3/uL (ref 150–400)
RBC: 4.21 MIL/uL (ref 3.87–5.11)
RDW: 12.4 % (ref 11.5–15.5)
WBC: 7 10*3/uL (ref 4.0–10.5)
nRBC: 0 % (ref 0.0–0.2)

## 2023-03-30 LAB — COMPREHENSIVE METABOLIC PANEL
ALT: 22 U/L (ref 0–44)
AST: 22 U/L (ref 15–41)
Albumin: 3.8 g/dL (ref 3.5–5.0)
Alkaline Phosphatase: 65 U/L (ref 38–126)
Anion gap: 10 (ref 5–15)
BUN: 16 mg/dL (ref 8–23)
CO2: 22 mmol/L (ref 22–32)
Calcium: 8.7 mg/dL — ABNORMAL LOW (ref 8.9–10.3)
Chloride: 104 mmol/L (ref 98–111)
Creatinine, Ser: 0.75 mg/dL (ref 0.44–1.00)
GFR, Estimated: >60 mL/min (ref >60)
Glucose, Bld: 104 mg/dL — ABNORMAL HIGH (ref 70–99)
Potassium: 3.7 mmol/L (ref 3.5–5.1)
Sodium: 136 mmol/L (ref 135–145)
Total Bilirubin: 0.4 mg/dL (ref 0.3–1.2)
Total Protein: 7.1 g/dL (ref 6.5–8.1)

## 2023-03-30 LAB — LACTIC ACID, PLASMA
Lactic Acid, Venous: 3 mmol/L (ref 0.5–1.9)
Lactic Acid, Venous: 1.4 mmol/L (ref 0.5–1.9)

## 2023-03-30 LAB — LIPASE, BLOOD: Lipase: 28 U/L (ref 11–51)

## 2023-03-30 MED ORDER — LOSARTAN POTASSIUM 50 MG PO TABS
100.0000 mg | ORAL_TABLET | Freq: Every day | ORAL | Status: DC
  Administered 2023-03-31: 100 mg via ORAL
  Filled 2023-03-30: qty 2

## 2023-03-30 MED ORDER — ACETAMINOPHEN 650 MG RE SUPP: 650.0000 mg | Freq: Four times a day (QID) | RECTAL | Status: DC | PRN

## 2023-03-30 MED ORDER — HYDROMORPHONE HCL 1 MG/ML IJ SOLN
1.0000 mg | Freq: Once | INTRAMUSCULAR | Status: DC
  Filled 2023-03-30: qty 1

## 2023-03-30 MED ORDER — SODIUM CHLORIDE 0.9% FLUSH
3.0000 mL | Freq: Two times a day (BID) | INTRAVENOUS | Status: DC
  Administered 2023-03-30 (×2): 3 mL via INTRAVENOUS

## 2023-03-30 MED ORDER — ACETAMINOPHEN 325 MG PO TABS
650.0000 mg | ORAL_TABLET | Freq: Four times a day (QID) | ORAL | Status: DC | PRN
  Administered 2023-03-30: 650 mg via ORAL
  Filled 2023-03-30: qty 2

## 2023-03-30 MED ORDER — POLYETHYLENE GLYCOL 3350 17 G PO PACK
17.0000 g | PACK | Freq: Every day | ORAL | Status: DC | PRN
  Administered 2023-03-31: 17 g via ORAL
  Filled 2023-03-30: qty 1

## 2023-03-30 MED ORDER — CIPROFLOXACIN HCL 500 MG PO TABS
500.0000 mg | ORAL_TABLET | Freq: Two times a day (BID) | ORAL | Status: DC
  Administered 2023-03-30 – 2023-03-31 (×2): 500 mg via ORAL
  Filled 2023-03-30 (×2): qty 1

## 2023-03-30 MED ORDER — MORPHINE SULFATE (PF) 4 MG/ML IV SOLN
4.0000 mg | Freq: Once | INTRAVENOUS | Status: AC
  Administered 2023-03-30: 4 mg via INTRAVENOUS
  Filled 2023-03-30: qty 1

## 2023-03-30 MED ORDER — ONDANSETRON HCL 4 MG/2ML IJ SOLN
4.0000 mg | Freq: Once | INTRAMUSCULAR | Status: AC
  Administered 2023-03-30: 4 mg via INTRAVENOUS
  Filled 2023-03-30: qty 2

## 2023-03-30 MED ORDER — FAMOTIDINE 20 MG PO TABS
20.0000 mg | ORAL_TABLET | Freq: Two times a day (BID) | ORAL | Status: DC
  Administered 2023-03-30 – 2023-03-31 (×2): 20 mg via ORAL
  Filled 2023-03-30 (×2): qty 1

## 2023-03-30 MED ORDER — ONDANSETRON HCL 4 MG/2ML IJ SOLN: 4.0000 mg | Freq: Four times a day (QID) | INTRAMUSCULAR | Status: DC | PRN

## 2023-03-30 MED ORDER — LOSARTAN POTASSIUM-HCTZ 100-25 MG PO TABS: 1.0000 | ORAL_TABLET | Freq: Every day | ORAL | Status: DC

## 2023-03-30 MED ORDER — IOHEXOL 300 MG/ML  SOLN
100.0000 mL | Freq: Once | INTRAMUSCULAR | Status: AC | PRN
  Administered 2023-03-30: 100 mL via INTRAVENOUS

## 2023-03-30 MED ORDER — OXYCODONE HCL 5 MG PO TABS: 5.0000 mg | ORAL_TABLET | ORAL | Status: DC | PRN

## 2023-03-30 MED ORDER — HYDROCHLOROTHIAZIDE 25 MG PO TABS
25.0000 mg | ORAL_TABLET | Freq: Every day | ORAL | Status: DC
  Administered 2023-03-31: 25 mg via ORAL
  Filled 2023-03-30: qty 1

## 2023-03-30 MED ORDER — MORPHINE SULFATE (PF) 2 MG/ML IV SOLN: 2.0000 mg | INTRAVENOUS | Status: DC | PRN

## 2023-03-30 MED ORDER — METRONIDAZOLE 500 MG PO TABS
500.0000 mg | ORAL_TABLET | Freq: Three times a day (TID) | ORAL | Status: DC
  Administered 2023-03-30 – 2023-03-31 (×2): 500 mg via ORAL
  Filled 2023-03-30 (×2): qty 1

## 2023-03-30 MED ORDER — PIPERACILLIN-TAZOBACTAM 3.375 G IVPB 30 MIN
3.3750 g | Freq: Once | INTRAVENOUS | Status: AC
  Administered 2023-03-30: 3.375 g via INTRAVENOUS
  Filled 2023-03-30: qty 50

## 2023-03-30 MED ORDER — SODIUM CHLORIDE 0.9 % IV BOLUS
1000.0000 mL | Freq: Once | INTRAVENOUS | Status: AC
  Administered 2023-03-30: 1000 mL via INTRAVENOUS

## 2023-03-30 MED ORDER — SODIUM CHLORIDE 0.9 % IV BOLUS
500.0000 mL | Freq: Once | INTRAVENOUS | Status: AC
  Administered 2023-03-30: 500 mL via INTRAVENOUS

## 2023-03-30 MED ORDER — PAROXETINE HCL 20 MG PO TABS
20.0000 mg | ORAL_TABLET | Freq: Every day | ORAL | Status: DC
  Administered 2023-03-31: 20 mg via ORAL
  Filled 2023-03-30: qty 1

## 2023-03-30 MED ORDER — ONDANSETRON HCL 4 MG PO TABS: 4.0000 mg | ORAL_TABLET | Freq: Four times a day (QID) | ORAL | Status: DC | PRN

## 2023-03-30 MED ORDER — METOPROLOL SUCCINATE ER 25 MG PO TB24
12.5000 mg | ORAL_TABLET | Freq: Every day | ORAL | Status: DC
  Administered 2023-03-31: 12.5 mg via ORAL
  Filled 2023-03-30: qty 1

## 2023-03-30 MED ORDER — ENOXAPARIN SODIUM 60 MG/0.6ML IJ SOSY
0.5000 mg/kg | PREFILLED_SYRINGE | INTRAMUSCULAR | Status: DC
  Filled 2023-03-30: qty 0.6

## 2023-03-30 NOTE — Assessment & Plan Note (Addendum)
Patient notes that she has had multiple episodes of diverticulitis with the most recent episode being the most complicated.  She has talked with her medical team about potential colectomy in her future.  - Continue antibiotic course prescribed on discharge - Continue outpatient follow-up with general surgery and GI

## 2023-03-30 NOTE — ED Provider Notes (Signed)
[facility] Provider Note    Event Date/Time   First MD Initiated Contact with Patient 03/30/23 [phone]     (approximate)   History   Abdominal Pain   HPI  Joy Vaughan is a 72 y.o. female with history of GERD, hypertension, sick sinus syndrome status post pacemaker placement, and recent admission for diverticulitis with abscess who presents with worsening left lower quadrant abdominal pain.  The patient states that she was discharged from the hospital several days ago.  The pain was stable for the first few days at home, but then acutely worsened over the last 1 to 2 days.  It is in the same location in the left lower quadrant.  She denies any associated nausea or vomiting.  She has still been having some loose stools.  She denies any blood in the stool.  She has been taking her antibiotics and took the most recent dose this morning.  She denies any fever or chills.  I reviewed the past medical records.  The patient was admitted from 8/4 to 8/8 for diverticular abscess.  She initially was treated for diverticulitis with Augmentin starting on 7/31.  During the admission earlier this month the patient was treated with Zosyn and evaluated by surgery.  The abscess was determined to be too small for surgical intervention and her symptoms improved with antibiotics.  She was discharged on Cipro and Flagyl.   Physical Exam   Triage Vital Signs: ED Triage Vitals  Encounter Vitals Group     BP 03/30/23 0841 (!) 159/83     Systolic BP Percentile --      Diastolic BP Percentile --      Pulse Rate 03/30/23 0841 67     Resp 03/30/23 0841 20     Temp 03/30/23 0841 99.1 F (37.3 C)     Temp Source 03/30/23 0841 Oral     SpO2 03/30/23 0841 100 %     Weight 03/30/23 0844 205 lb (93 kg)     Height 03/30/23 0838 5\' 5"  (1.651 m)     Head Circumference --      Peak Flow --      Pain Score 03/30/23 0838 10     Pain Loc --      Pain Education --      Exclude from Growth  Chart --     Most recent vital signs: Vitals:   03/30/23 1513 03/30/23 1534  BP: 138/65 137/74  Pulse: 62 65  Resp: 17 17  Temp: 98.2 F (36.8 C) 97.9 F (36.6 C)  SpO2: 99% 98%     General: Alert, uncomfortable appearing but in no distress.  CV:  Good peripheral perfusion.  Resp:  Normal effort.  Abd:  Soft with moderate left lower quadrant tenderness.  No distention.  Other:  No jaundice or scleral icterus.  Moist mucous membranes.   ED Results / Procedures / Treatments   Labs (all labs ordered are listed, but only abnormal results are displayed) Labs Reviewed  COMPREHENSIVE METABOLIC PANEL - Abnormal; Notable for the following components:      Result Value   Glucose, Bld 104 (*)    Calcium 8.7 (*)    All other components within normal limits  URINALYSIS, ROUTINE W REFLEX MICROSCOPIC - Abnormal; Notable for the following components:   Color, Urine YELLOW (*)    APPearance CLEAR (*)    Leukocytes,Ua TRACE (*)    All other components within normal limits  LACTIC  ACID, PLASMA - Abnormal; Notable for the following components:   Lactic Acid, Venous 3.0 (*)    All other components within normal limits  LIPASE, BLOOD  CBC  LACTIC ACID, PLASMA     EKG     RADIOLOGY  CT abdomen/pelvis: I independently viewed and interpreted the images; no dilated bowel loops or any free air or free fluid.  Radiology report indicates resolved abscess and inflammatory findings.   PROCEDURES:  Critical Care performed: No  Procedures   MEDICATIONS ORDERED IN ED: Medications  HYDROmorphone (DILAUDID) injection 1 mg (1 mg Intravenous Patient Refused/Not Given 03/30/23 1105)  enoxaparin (LOVENOX) injection 47.5 mg (has no administration in time range)  sodium chloride flush (NS) 0.9 % injection 3 mL (3 mLs Intravenous Given 03/30/23 1345)  acetaminophen (TYLENOL) tablet 650 mg (has no administration in time range)    Or  acetaminophen (TYLENOL) suppository 650 mg (has no  administration in time range)  morphine (PF) 2 MG/ML injection 2 mg (has no administration in time range)  oxyCODONE (Oxy IR/ROXICODONE) immediate release tablet 5 mg (has no administration in time range)  ondansetron (ZOFRAN) tablet 4 mg (has no administration in time range)    Or  ondansetron (ZOFRAN) injection 4 mg (has no administration in time range)  polyethylene glycol (MIRALAX / GLYCOLAX) packet 17 g (has no administration in time range)  morphine (PF) 4 MG/ML injection 4 mg (4 mg Intravenous Given 03/30/23 0926)  sodium chloride 0.9 % bolus 500 mL (0 mLs Intravenous Stopped 03/30/23 1227)  ondansetron (ZOFRAN) injection 4 mg (4 mg Intravenous Given 03/30/23 0932)  iohexol (OMNIPAQUE) 300 MG/ML solution 100 mL (100 mLs Intravenous Contrast Given 03/30/23 0939)  sodium chloride 0.9 % bolus 1,000 mL (0 mLs Intravenous Stopped 03/30/23 1227)  piperacillin-tazobactam (ZOSYN) IVPB 3.375 g (0 g Intravenous Stopped 03/30/23 1227)     IMPRESSION / MDM / ASSESSMENT AND PLAN / ED COURSE  I reviewed the triage vital signs and the nursing notes.  72 year old female with PMH as noted above including recent admission for diverticulitis with abscess treated with IV Zosyn but not requiring surgical intervention now presents with persistent worsening left lower quadrant pain.  On exam her temperature is borderline elevated.  She has tenderness in the left lower quadrant.  Differential diagnosis includes, but is not limited to, persistent/worsening diverticulitis, worsening or increasing abscess, perforation, colitis, peritonitis.  We will obtain lab workup and CT for further evaluation.  Patient's presentation is most consistent with acute presentation with potential threat to life or bodily function.  The patient is on the cardiac monitor to evaluate for evidence of arrhythmia and/or significant heart rate changes.  ----------------------------------------- 12:57 PM on  03/30/2023 -----------------------------------------  Initial lactate is elevated.  Other lab workup is reassuring.  There is no leukocytosis.  Urinalysis is negative.  CT shows resolution of the prior findings of diverticulitis and the prior fluid collection.  I consulted and discussed case with Dr. Tonna Boehringer from general surgery who evaluated the images.  He does not recommend any acute intervention.  However, the patient continues to have relatively severe pain.  Given her clinical presentation as well as the elevated lactate I ordered a dose of IV Zosyn.  I consulted Dr. Huel Cote from the hospitalist service; based on her discussion she agrees to evaluate the patient for admission.   FINAL CLINICAL IMPRESSION(S) / ED DIAGNOSES   Final diagnoses:  Left lower quadrant abdominal pain     Rx / DC Orders   ED  Discharge Orders     None        Note:  This document was prepared using Dragon voice recognition software and may include unintentional dictation errors.    Dionne Bucy, MD 03/30/23 [phone]

## 2023-03-30 NOTE — Progress Notes (Signed)
Pt arrived from the ED to [address] at 1530.

## 2023-03-30 NOTE — Assessment & Plan Note (Signed)
Continue losartan HCT

## 2023-03-30 NOTE — ED Triage Notes (Signed)
Pt via POV from home. Pt c/o LLQ pain, pt has a hx of diverticulitis. Pt was seen last week and admitted for the same. Pt was discharged 2 days ago after admission and has been on abx since then but states that the pain today is now worse. Pt is A&Ox4 and NAD

## 2023-03-30 NOTE — Assessment & Plan Note (Signed)
No signs of heart failure

## 2023-03-30 NOTE — Plan of Care (Signed)

## 2023-03-30 NOTE — H&P (Signed)
History and Physical    Patient: Joy Vaughan BMW:[id number] DOB: 04/10/1951 DOA: 03/30/2023 DOS: the patient was seen and examined on 03/30/2023 PCP: Dana Allan, MD  Patient coming from: Home  Chief Complaint:  Chief Complaint  Patient presents with   Abdominal Pain   HPI: Joy Vaughan is a 72 y.o. female with medical history significant of recurrent diverticulitis, hypertension, hypothyroidism, SSS s/p PPM, dilated cardiomyopathy, who presents to the ED due to abdominal pain  [name] states that she was recently hospitalized for diverticulitis that was complicated by an abscess.  She states that she has been dealing with this episode of diverticulitis for nearly 3 weeks now.  After arriving home, her pain continued to improve, however yesterday morning she began to experience left lower quadrant pain once again.  Pain progressively worsened throughout the last 24 hours.  She denies any changes in her bowel movements, melena or hematochezia.  She has still been able to tolerate p.o. intake.  She does not drink much water but has been drinking sweet tea.  She denies any fever, chills, dysuria.  ED course: On arrival to the ED, patient was hypertensive at 159/83 with heart rate of 67.  She was afebrile at 99.1.  She is saturating at 100% on room air.  Initial workup demonstrates normal CBC, and CMP notable only for glucose of 104.  Lactic acid elevated at 3.0.  Urinalysis with trace leukocytes but no bacteria, nitrites, hematuria or ketonuria.  CT of the abdomen was obtained that demonstrated interval resolution of diverticulitis and abscess with no acute findings at this time.  Patient started on IV fluids, IV Zosyn, and IV morphine.  TRH contacted for admission.  Review of Systems: As mentioned in the history of present illness. All other systems reviewed and are negative.  Past Medical History:  Diagnosis Date   Diverticulosis    GERD (gastroesophageal reflux disease)     Hypertension    Sleep apnea    Past Surgical History:  Procedure Laterality Date   ANKLE ARTHROSCOPY WITH REPAIR SUBLUXING TENDON     CARDIAC CATHETERIZATION     PACEMAKER IMPLANT N/A 03/11/2022   Procedure: PACEMAKER IMPLANT;  Surgeon: Marcina Millard, MD;  Location: [facility] INVASIVE CV LAB;  Service: Cardiovascular;  Laterality: N/A;   PACEMAKER PLACEMENT Left 03/11/2022   PARTIAL HYSTERECTOMY     1987   REPLACEMENT TOTAL KNEE     REPLACEMENT TOTAL KNEE Right    REPLACEMENT TOTAL KNEE Left    2016   TOTAL HIP ARTHROPLASTY Bilateral    2018,2019   Social History:  reports that she has quit smoking. She has never used smokeless tobacco. She reports that she does not drink alcohol and does not use drugs.  Allergies  Allergen Reactions   Ezetimibe     Other reaction(s): Muscle Pain   Statins     Other reaction(s): Muscle Pain   Sulfa Antibiotics Itching and Rash    Family History  Problem Relation Age of Onset   Prostate cancer Neg Hx    Bladder Cancer Neg Hx    Testicular cancer Neg Hx    Kidney cancer Neg Hx     Prior to Admission medications   Medication Sig Start Date End Date Taking? Authorizing Provider  acetaminophen (TYLENOL) 500 MG tablet Take 500 mg by mouth every 6 (six) hours as needed for mild pain, headache or fever.    [provider]  Calcium Carb-Cholecalciferol 600-400 MG-UNIT TABS Take 1 tablet  by mouth daily. Patient not taking: Reported on 03/28/2023    [provider]  Cholecalciferol 25 MCG (1000 UT) tablet Take 1,000 Units by mouth daily. Patient not taking: Reported on 03/28/2023    [provider]  ciprofloxacin (CIPRO) 500 MG tablet Take 1 tablet (500 mg total) by mouth 2 (two) times daily for 10 days. 03/24/23 04/03/23  Donovan Kail, PA-C  famotidine (PEPCID) 20 MG tablet Take 20 mg by mouth 2 (two) times daily.    [provider]  losartan-hydrochlorothiazide (HYZAAR) 100-25 MG per tablet Take 1 tablet by  mouth daily.    [provider]  magnesium oxide (MAG-OX) 400 (240 Mg) MG tablet Take 400 mg by mouth daily. Patient not taking: Reported on 03/28/2023    [provider]  metoprolol succinate (TOPROL-XL) 25 MG 24 hr tablet Take 12.5 mg by mouth daily.    [provider]  metroNIDAZOLE (FLAGYL) 500 MG tablet Take 1 tablet (500 mg total) by mouth 3 (three) times daily for 10 days. 03/24/23 04/03/23  Donovan Kail, PA-C  omeprazole (PRILOSEC) 20 MG capsule Take 20 mg by mouth daily. Patient not taking: Reported on 03/28/2023    [provider]  PARoxetine (PAXIL) 20 MG tablet Take 20 mg by mouth daily.    [provider]    Physical Exam: Vitals:   03/30/23 1015 03/30/23 1100 03/30/23 1130 03/30/23 1300  BP: (!) 185/82 (!) 178/74 (!) 179/68 (!) 174/71  Pulse: 62 61 61 (!) 59  Resp: 18 18 18 17   Temp:      TempSrc:      SpO2: 98% 100% 100% 98%  Weight:      Height:       Physical Exam Vitals and nursing note reviewed.  Constitutional:      General: She is not in acute distress. HENT:     Head: Normocephalic and atraumatic.     Mouth/Throat:     Mouth: Mucous membranes are moist.     Pharynx: Oropharynx is clear.  Eyes:     Extraocular Movements: Extraocular movements intact.  Cardiovascular:     Rate and Rhythm: Normal rate and regular rhythm.  Pulmonary:     Effort: Pulmonary effort is normal. No respiratory distress.     Breath sounds: Normal breath sounds. No wheezing, rhonchi or rales.  Abdominal:     General: Bowel sounds are decreased.     Tenderness: There is abdominal tenderness in the left lower quadrant. There is no left CVA tenderness or guarding.  Skin:    General: Skin is warm and dry.  Neurological:     General: No focal deficit present.     Mental Status: She is alert and oriented to person, place, and time.  Psychiatric:        Mood and Affect: Mood normal.        Behavior: Behavior normal.    Data  Reviewed: CBC with WBC of 7.0, hemoglobin 12.9, platelets of 350 CMP with sodium of 136, potassium 3.7, bicarb 22, glucose 104, BUN 16, creatinine 0.75, AST 22, ALT 22, GFR above 60 Lipase within normal limits at 28 Lactic acid 3.0  Urinalysis with trace leukocytes only.  No bacteria, nitrites  CT ABDOMEN PELVIS W CONTRAST  Result Date: 03/30/2023 CLINICAL DATA:  LEFT lower quadrant abdominal pain. Recent diverticulitis. EXAM: CT ABDOMEN AND PELVIS WITH CONTRAST TECHNIQUE: Multidetector CT imaging of the abdomen and pelvis was performed using the standard protocol following bolus administration  of intravenous contrast. RADIATION DOSE REDUCTION: This exam was performed according to the departmental dose-optimization program which includes automated exposure control, adjustment of the mA and/or kV according to patient size and/or use of iterative reconstruction technique. CONTRAST:  OMNIPAQUE IOHEXOL 300 MG/ML  SOLN COMPARISON:  CT 03/20/2023, CT 09/26/2018 FINDINGS: Lower chest: Lung bases are clear. Hepatobiliary: Small enhancing lesion dome liver measuring 7 mm on image 12/5 is present on CT from 2020 favored benign vascular phenomena. Gallbladder normal. Pancreas: Pancreas is normal. No ductal dilatation. No pancreatic inflammation. Spleen: 2 low-density septated lesion in the spleen unchanged over multiple comparison exams. Adrenals/urinary tract: Adrenal glands and kidneys are normal. The ureters and bladder normal. Stomach/Bowel: Small hiatal hernia. Stomach, duodenum small-bowel normal. Normal appendix. Ascending and transverse colon normal. Multiple diverticular the descending colon and sigmoid colon. Interval improvement in the pericolonic inflammation surrounding the descending colon as seen on comparison exams. The inflamed diverticulum of the descending colon on comparison exam is resolved. Low-density lesion in the wall of the sigmoid colon described on comparison exam has since resolved.  No perforation or abscess. Vascular/Lymphatic: Abdominal aorta is normal caliber with atherosclerotic calcification. There is no retroperitoneal or periportal lymphadenopathy. No pelvic lymphadenopathy. Reproductive: Suspected hysterectomy. Evaluation the pelvic structures is limited by artifact from the bilateral hip prosthetics. Other: No free fluid. Musculoskeletal: No aggressive osseous lesion. IMPRESSION: 1. Interval resolution of diverticulitis of the descending colon. 2. Interval resolution of low-density lesion in the wall of the sigmoid colon. 3. No acute findings in the abdomen pelvis. 4. Aortic Atherosclerosis (ICD10-I70.0). Electronically Signed   By: Genevive Bi M.D.   On: 03/30/2023 11:23    Results are pending, will review when available.  Assessment and Plan:  * Intractable abdominal pain Patient is presenting with intractable abdominal pain for approximately 24 hours in the setting of recent complicated diverticulitis.  CT imaging with resolution of diverticulitis and associated abscess.  No evidence of fever or leukocytosis.  My primary suspicion is that this is residual pain associated with her most recent course of complicated diverticulitis.  No diarrhea reported.  Lactic acid elevation is likely due to poor water intake.  - Pain management with Tylenol, oxycodone and IV morphine - Nausea management with Zofran - Repeat lactic acid pending  Diverticulitis Patient notes that she has had multiple episodes of diverticulitis with the most recent episode being the most complicated.  She has talked with her medical team about potential colectomy in her future.  - Continue antibiotic course prescribed on discharge - Continue outpatient follow-up with general surgery and GI  HTN (hypertension) - Resume home losartan-HCTZ tomorrow - Management of hypertension today with pain control  Dilated cardiomyopathy (HCC) Patient appears euvolemic at this time.   Advance Care  Planning:   Code Status: Full Code   Consults: None  Family Communication: Patient's husband updated at bedside  Severity of Illness: The appropriate patient status for this patient is OBSERVATION. Observation status is judged to be reasonable and necessary in order to provide the required intensity of service to ensure the patient's safety. The patient's presenting symptoms, physical exam findings, and initial radiographic and laboratory data in the context of their medical condition is felt to place them at decreased risk for further clinical deterioration. Furthermore, it is anticipated that the patient will be medically stable for discharge from the hospital within 2 midnights of admission.   Author: Verdene Lennert, MD 03/30/2023 1:41 PM  For on call review [email or web address].

## 2023-03-30 NOTE — Assessment & Plan Note (Addendum)
Patient is presenting with intractable abdominal pain for approximately 24 hours in the setting of recent complicated diverticulitis.  CT imaging with resolution of diverticulitis and associated abscess.  No evidence of fever or leukocytosis.  My primary suspicion is that this is residual pain associated with her most recent course of complicated diverticulitis.  No diarrhea reported.  Lactic acid elevation is likely due to poor water intake.  - Pain management with Tylenol, oxycodone and IV morphine - Nausea management with Zofran - Repeat lactic acid pending

## 2023-03-31 DIAGNOSIS — I420 Dilated cardiomyopathy: Secondary | ICD-10-CM | POA: Diagnosis not present

## 2023-03-31 DIAGNOSIS — R109 Unspecified abdominal pain: Secondary | ICD-10-CM | POA: Diagnosis not present

## 2023-03-31 DIAGNOSIS — I10 Essential (primary) hypertension: Secondary | ICD-10-CM | POA: Diagnosis not present

## 2023-03-31 DIAGNOSIS — K219 Gastro-esophageal reflux disease without esophagitis: Secondary | ICD-10-CM

## 2023-03-31 DIAGNOSIS — E669 Obesity, unspecified: Secondary | ICD-10-CM | POA: Insufficient documentation

## 2023-03-31 DIAGNOSIS — R112 Nausea with vomiting, unspecified: Secondary | ICD-10-CM | POA: Diagnosis not present

## 2023-03-31 DIAGNOSIS — K5792 Diverticulitis of intestine, part unspecified, without perforation or abscess without bleeding: Secondary | ICD-10-CM | POA: Diagnosis not present

## 2023-03-31 LAB — CBC
HCT: 37.9 % (ref 36.0–46.0)
Hemoglobin: 12.5 g/dL (ref 12.0–15.0)
MCH: 29.8 pg (ref 26.0–34.0)
MCHC: 33 g/dL (ref 30.0–36.0)
MCV: 90.5 fL (ref 80.0–100.0)
Platelets: 335 10*3/uL (ref 150–400)
RBC: 4.19 MIL/uL (ref 3.87–5.11)
RDW: 12.5 % (ref 11.5–15.5)
WBC: 7.4 10*3/uL (ref 4.0–10.5)
nRBC: 0 % (ref 0.0–0.2)

## 2023-03-31 MED ORDER — POLYETHYLENE GLYCOL 3350 17 G PO PACK: 17.0000 g | PACK | Freq: Every day | ORAL | Status: DC | PRN

## 2023-03-31 NOTE — Hospital Course (Signed)
72 y.o. female with medical history significant of recurrent diverticulitis, hypertension, hypothyroidism, SSS s/p PPM, dilated cardiomyopathy, who presents to the ED due to abdominal pain   Joy Vaughan states that she was recently hospitalized for diverticulitis that was complicated by an abscess.  She states that she has been dealing with this episode of diverticulitis for nearly 3 weeks now.  After arriving home, her pain continued to improve, however yesterday morning she began to experience left lower quadrant pain once again.  Pain progressively worsened throughout the last 24 hours.  She denies any changes in her bowel movements, melena or hematochezia.  She has still been able to tolerate p.o. intake.  She does not drink much water but has been drinking sweet tea.  She denies any fever, chills, dysuria.   ED course: On arrival to the ED, patient was hypertensive at 159/83 with heart rate of 67.  She was afebrile at 99.1.  She is saturating at 100% on room air.  Initial workup demonstrates normal CBC, and CMP notable only for glucose of 104.  Lactic acid elevated at 3.0.  Urinalysis with trace leukocytes but no bacteria, nitrites, hematuria or ketonuria.  CT of the abdomen was obtained that demonstrated interval resolution of diverticulitis and abscess with no acute findings at this time.  Patient started on IV fluids, IV Zosyn, and IV morphine.  TRH contacted for admission.  8/15.  Patient feeling better and wanting to go home.  Tolerated diet.  Case discussed with Dr. Adline Potter general surgery he will follow-up as outpatient.  Complete oral antibiotic course.

## 2023-03-31 NOTE — Assessment & Plan Note (Signed)
BMI of 34.11

## 2023-03-31 NOTE — Discharge Summary (Signed)
Physician Discharge Summary   Patient: Joy Vaughan MRN: [id number] DOB: 27-Jun-1951  Admit date:     03/30/2023  Discharge date: 03/31/23  Discharge Physician: Alford Highland   PCP: Dana Allan, MD   Recommendations at discharge:   Follow-up PCP 5 days Follow-up Dr. Elenor Legato general surgery  Discharge Diagnoses: Principal Problem:   Intractable abdominal pain Active Problems:   Diverticulitis   HTN (hypertension)   GERD (gastroesophageal reflux disease)   Dilated cardiomyopathy (HCC)   Obesity (BMI 30-39.9)    Hospital Course: 72 y.o. female with medical history significant of recurrent diverticulitis, hypertension, hypothyroidism, SSS s/p PPM, dilated cardiomyopathy, who presents to the ED due to abdominal pain   Joy Vaughan states that she was recently hospitalized for diverticulitis that was complicated by an abscess.  She states that she has been dealing with this episode of diverticulitis for nearly 3 weeks now.  After arriving home, her pain continued to improve, however yesterday morning she began to experience left lower quadrant pain once again.  Pain progressively worsened throughout the last 24 hours.  She denies any changes in her bowel movements, melena or hematochezia.  She has still been able to tolerate p.o. intake.  She does not drink much water but has been drinking sweet tea.  She denies any fever, chills, dysuria.   ED course: On arrival to the ED, patient was hypertensive at 159/83 with heart rate of 67.  She was afebrile at 99.1.  She is saturating at 100% on room air.  Initial workup demonstrates normal CBC, and CMP notable only for glucose of 104.  Lactic acid elevated at 3.0.  Urinalysis with trace leukocytes but no bacteria, nitrites, hematuria or ketonuria.  CT of the abdomen was obtained that demonstrated interval resolution of diverticulitis and abscess with no acute findings at this time.  Patient started on IV fluids, IV Zosyn, and IV morphine.  [facility]  contacted for admission.  8/15.  Patient feeling better and wanting to go home.  Tolerated diet.  Case discussed with Dr. Adline Potter general surgery he will follow-up as outpatient.  Complete oral antibiotic course.  Assessment and Plan: * Intractable abdominal pain Improved today.  Feels a little soreness in her abdomen.  Repeat CT scan shows resolution of abscess.  Patient did not want any pain medications to go home with.  Diverticulitis Complete oral antibiotic course.  Case discussed with Dr. Alvester Morin bone surgery and he will follow-up.  HTN (hypertension) Continue losartan HCT  Dilated cardiomyopathy (HCC) No signs of heart failure  GERD (gastroesophageal reflux disease) On H2 blocker.  Obesity (BMI 30-39.9) BMI of 34.11         Consultants: I messaged Dr. Everlene Farrier that patient is doing better and she had a repeat CT scan. Procedures performed: None Disposition: Home Diet recommendation:  Heart healthy DISCHARGE MEDICATION: Allergies as of 03/31/2023       Reactions   Ezetimibe    Other reaction(s): Muscle Pain   Statins    Other reaction(s): Muscle Pain   Sulfa Antibiotics Itching, Rash        Medication List     TAKE these medications    acetaminophen 500 MG tablet Commonly known as: TYLENOL Take 500 mg by mouth every 6 (six) hours as needed for mild pain, headache or fever.   ciprofloxacin 500 MG tablet Commonly known as: Cipro Take 1 tablet (500 mg total) by mouth 2 (two) times daily for 10 days.   famotidine 20 MG tablet  Commonly known as: PEPCID Take 20 mg by mouth 2 (two) times daily.   losartan-hydrochlorothiazide 100-25 MG tablet Commonly known as: HYZAAR Take 1 tablet by mouth daily.   metoprolol succinate 25 MG 24 hr tablet Commonly known as: TOPROL-XL Take 12.5 mg by mouth daily.   metroNIDAZOLE 500 MG tablet Commonly known as: Flagyl Take 1 tablet (500 mg total) by mouth 3 (three) times daily for 10 days.   PARoxetine 20 MG  tablet Commonly known as: PAXIL Take 20 mg by mouth daily.   polyethylene glycol 17 g packet Commonly known as: MIRALAX / GLYCOLAX Take 17 g by mouth daily as needed for mild constipation.        Follow-up Information     Dana Allan, MD Follow up in 5 day(s).   Specialty: Family Medicine Contact information: [address] [place] [address] [phone]         Leafy Ro, MD Follow up in 1 week(s).   Specialty: General Surgery Contact information: [address] [place] [address] [phone]                Discharge Exam: Ceasar Mons Weights   03/30/23 0844  Weight: 93 kg   Physical Exam HENT:     Head: Normocephalic.     Mouth/Throat:     Pharynx: No oropharyngeal exudate.  Eyes:     General: Lids are normal.     Conjunctiva/sclera: Conjunctivae normal.  Cardiovascular:     Rate and Rhythm: Normal rate and regular rhythm.     Heart sounds: Normal heart sounds, S1 normal and S2 normal.  Pulmonary:     Breath sounds: Normal breath sounds. No decreased breath sounds, wheezing, rhonchi or rales.  Abdominal:     Palpations: Abdomen is soft.     Tenderness: There is abdominal tenderness in the left lower quadrant.  Musculoskeletal:     Right lower leg: No swelling.     Left lower leg: No swelling.  Skin:    General: Skin is warm.     Findings: No rash.  Neurological:     Mental Status: She is alert and oriented to person, place, and time.      Condition at discharge: stable  The results of significant diagnostics from this hospitalization (including imaging, microbiology, ancillary and laboratory) are listed below for reference.   Imaging Studies: CT ABDOMEN PELVIS W CONTRAST  Result Date: 03/30/2023 CLINICAL DATA:  LEFT lower quadrant abdominal pain. Recent diverticulitis. EXAM: CT ABDOMEN AND PELVIS WITH CONTRAST TECHNIQUE: Multidetector CT imaging of the abdomen and pelvis was performed using the standard  protocol following bolus administration of intravenous contrast. RADIATION DOSE REDUCTION: This exam was performed according to the departmental dose-optimization program which includes automated exposure control, adjustment of the mA and/or kV according to patient size and/or use of iterative reconstruction technique. CONTRAST:  OMNIPAQUE IOHEXOL 300 MG/ML  SOLN COMPARISON:  CT 03/20/2023, CT 09/26/2018 FINDINGS: Lower chest: Lung bases are clear. Hepatobiliary: Small enhancing lesion dome liver measuring 7 mm on image 12/5 is present on CT from 2020 favored benign vascular phenomena. Gallbladder normal. Pancreas: Pancreas is normal. No ductal dilatation. No pancreatic inflammation. Spleen: 2 low-density septated lesion in the spleen unchanged over multiple comparison exams. Adrenals/urinary tract: Adrenal glands and kidneys are normal. The ureters and bladder normal. Stomach/Bowel: Small hiatal hernia. Stomach, duodenum small-bowel normal. Normal appendix. Ascending and transverse colon normal. Multiple diverticular the descending colon and sigmoid colon. Interval improvement in the pericolonic  inflammation surrounding the descending colon as seen on comparison exams. The inflamed diverticulum of the descending colon on comparison exam is resolved. Low-density lesion in the wall of the sigmoid colon described on comparison exam has since resolved. No perforation or abscess. Vascular/Lymphatic: Abdominal aorta is normal caliber with atherosclerotic calcification. There is no retroperitoneal or periportal lymphadenopathy. No pelvic lymphadenopathy. Reproductive: Suspected hysterectomy. Evaluation the pelvic structures is limited by artifact from the bilateral hip prosthetics. Other: No free fluid. Musculoskeletal: No aggressive osseous lesion. IMPRESSION: 1. Interval resolution of diverticulitis of the descending colon. 2. Interval resolution of low-density lesion in the wall of the sigmoid colon. 3. No acute  findings in the abdomen pelvis. 4. Aortic Atherosclerosis (ICD10-I70.0). Electronically Signed   By: Genevive Bi M.D.   On: 03/30/2023 11:23   CT ABDOMEN PELVIS W CONTRAST  Result Date: 03/20/2023 CLINICAL DATA:  Diverticulitis, complication suspected EXAM: CT ABDOMEN AND PELVIS WITH CONTRAST TECHNIQUE: Multidetector CT imaging of the abdomen and pelvis was performed using the standard protocol following bolus administration of intravenous contrast. RADIATION DOSE REDUCTION: This exam was performed according to the departmental dose-optimization program which includes automated exposure control, adjustment of the mA and/or kV according to patient size and/or use of iterative reconstruction technique. CONTRAST:  OMNIPAQUE IOHEXOL 300 MG/ML  SOLN COMPARISON:  CT AP 03/16/23 FINDINGS: Lower chest: Lung bases are clear. Hepatobiliary: Liver has a normal contour. Contrast-enhancing lesion in the peripheral aspect of the right hepatic lobe is favored to represent a small flash filling hemangioma. No evidence of perihepatic fluid. No intra or extrahepatic biliary ductal dilatation. Pancreas: No evidence of peripancreatic fat stranding to suggest pancreatitis. Spleen: Unchanged indeterminate lesions in the upper and lower aspect of the spleen measuring up to 3.6 x 2.6 cm and 2.9 x 2.3 cm, respectively. Adrenals/Urinary Tract: Bilateral adrenal glands are normal in appearance. Bilateral kidneys enhance homogeneously and symmetrically. No evidence of hydronephrosis or nephrolithiasis. The urinary bladder is fluid-filled, but the degree of streak artifact from bilateral hip arthroplasties limits evaluation. Stomach/Bowel: No evidence of bowel obstruction. There is diverticulosis with redemonstration of diverticulitis of the descending/sigmoid colon junction. Compared to prior exam there is a new low-density lesion along the posterior wall of the sigmoid colon measuring 1.1 x 1.1 cm (series 4, image 65), favored to  represent an intramural abscess. No evidence of pneumoperitoneum. The appendix is not well visualized. Vascular/Lymphatic: No significant vascular findings are present. No enlarged abdominal or pelvic lymph nodes. Reproductive: Uterus and bilateral adnexa are unremarkable. Other: No abdominal wall hernia or abnormality. No abdominopelvic ascites. Musculoskeletal: No acute or significant osseous findings. IMPRESSION: Redemonstration of diverticulitis at the descending/sigmoid colon junction. Compared to prior exam there is a new low-density lesion along the posterior wall of the sigmoid colon measuring 1.1 x 1.1 cm, favored to represent an intramural abscess. No evidence of pneumoperitoneum. Electronically Signed   By: Lorenza Cambridge M.D.   On: 03/20/2023 15:43   CT ABDOMEN PELVIS W CONTRAST  Result Date: 03/16/2023 CLINICAL DATA:  Left lower quadrant abdominal pain EXAM: CT ABDOMEN AND PELVIS WITH CONTRAST TECHNIQUE: Multidetector CT imaging of the abdomen and pelvis was performed using the standard protocol following bolus administration of intravenous contrast. RADIATION DOSE REDUCTION: This exam was performed according to the departmental dose-optimization program which includes automated exposure control, adjustment of the mA and/or kV according to patient size and/or use of iterative reconstruction technique. CONTRAST:  OMNIPAQUE IOHEXOL 300 MG/ML  SOLN COMPARISON:  CT abdomen and pelvis  12/25/2021 FINDINGS: Lower chest: No acute abnormality. Hepatobiliary: The liver is enlarged. There is diffuse fatty infiltration of the liver. Gallbladder and bile ducts are within normal limits. Pancreas: Unremarkable. No pancreatic ductal dilatation or surrounding inflammatory changes. Spleen: There are 2 cystic areas in the spleen with thin peripheral calcifications measuring up to 3.5 cm similar to the prior study. Spleen is normal in size. Adrenals/Urinary Tract: Bladder is not well visualized secondary to streak  artifact in the pelvis. The kidneys and adrenal glands are within normal limits. Stomach/Bowel: There is sigmoid colon diverticulosis. There is mid sigmoid colon wall thickening and inflammation compatible with acute diverticulitis. There is no evidence for perforation or abscess. There is no bowel obstruction. The appendix appears within normal limits. Small bowel and stomach are within normal limits. Duodenal diverticulum again noted. Vascular/Lymphatic: Aortic atherosclerosis. No enlarged abdominal or pelvic lymph nodes. Reproductive: Status post hysterectomy. No adnexal masses. Other: No abdominal wall hernia or abnormality. No abdominopelvic ascites. 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 No acute fractures. IMPRESSION: 1. Acute uncomplicated sigmoid colon diverticulitis. 2. Hepatomegaly with fatty infiltration of the liver. 3. Aortic atherosclerosis. Aortic Atherosclerosis (ICD10-I70.0). Electronically Signed   By: Darliss Cheney M.D.   On: 03/16/2023 18:07    Microbiology: Results for orders placed or performed in visit on 05/27/21  Microscopic Examination     Status: None   Collection Time: 05/27/21  2:32 PM   Urine  Result Value Ref Range Status   WBC, UA None seen 0 - 5 /hpf Final   RBC, Urine 0-2 0 - 2 /hpf Final   Epithelial Cells (non renal) 0-10 0 - 10 /hpf Final   Bacteria, UA None seen None seen/Few Final    Labs: CBC: Recent Labs  Lab 03/28/23 1454 03/30/23 0907 03/31/23 0441  WBC 10.0 7.0 7.4  HGB 13.1 12.9 12.5  HCT 39.9 38.1 37.9  MCV 91.8 90.5 90.5  PLT 397.0 350 335   Basic Metabolic Panel: Recent Labs  Lab 03/28/23 1454 03/30/23 0907  NA 135 136  K 4.5 3.7  CL 99 104  CO2 25 22  GLUCOSE 99 104*  BUN 23 16  CREATININE 0.79 0.75  CALCIUM 10.1 8.7*   Liver Function Tests: Recent Labs  Lab 03/30/23 0907  AST 22  ALT 22  ALKPHOS 65  BILITOT 0.4  PROT 7.1  ALBUMIN 3.8   CBG: No results for input(s): "GLUCAP" in the last 168  hours.  Discharge time spent: greater than 30 minutes.  Signed: Alford Highland, MD [facility] 03/31/2023

## 2023-03-31 NOTE — Plan of Care (Signed)

## 2023-03-31 NOTE — Assessment & Plan Note (Signed)
On H2 blocker.

## 2023-04-10 ENCOUNTER — Encounter: Payer: Self-pay | Admitting: Family Medicine

## 2023-04-10 DIAGNOSIS — I5181 Takotsubo syndrome: Secondary | ICD-10-CM | POA: Insufficient documentation

## 2023-04-10 DIAGNOSIS — E559 Vitamin D deficiency, unspecified: Secondary | ICD-10-CM | POA: Insufficient documentation

## 2023-04-10 DIAGNOSIS — E538 Deficiency of other specified B group vitamins: Secondary | ICD-10-CM | POA: Insufficient documentation

## 2023-04-10 DIAGNOSIS — D509 Iron deficiency anemia, unspecified: Secondary | ICD-10-CM | POA: Insufficient documentation

## 2023-04-10 DIAGNOSIS — Z1159 Encounter for screening for other viral diseases: Secondary | ICD-10-CM | POA: Insufficient documentation

## 2023-04-10 DIAGNOSIS — E038 Other specified hypothyroidism: Secondary | ICD-10-CM | POA: Insufficient documentation

## 2023-04-10 HISTORY — DX: Iron deficiency anemia, unspecified: D50.9

## 2023-04-10 MED ORDER — VITAMIN D (ERGOCALCIFEROL) 1.25 MG (50000 UNIT) PO CAPS: 50000.0000 [IU] | ORAL_CAPSULE | ORAL | 0 refills | Status: DC

## 2023-04-10 NOTE — Assessment & Plan Note (Signed)
Chronic S/p pacemaker Takes Metoprolol XL 12.5 mg daily Follows with [facility]iology

## 2023-04-10 NOTE — Assessment & Plan Note (Signed)
Chronic Not currently on medication Asymptomatic Check TSH

## 2023-04-10 NOTE — Assessment & Plan Note (Signed)
Chronic Past OD with sleeping pills. Requesting Ambien today.  Given history do not think this is appropriate treatment.  Recommend sleep hygiene  Currently takes Paxil 20 mg daily Recommend psychiatry referral. Declined at this time.

## 2023-04-10 NOTE — Assessment & Plan Note (Signed)
Hospital follow up No signs of worsening abdominal pain Continues to take Cipro and Flagyl Has follow up with surgery scheduled 08/29 Obtain CBC and Bmet today Strict ED precautions provided

## 2023-04-10 NOTE — Assessment & Plan Note (Signed)
Chronic Well controlled on Hyzaar 100-25 mg daily Follows with [facility] Cardiology

## 2023-04-10 NOTE — Assessment & Plan Note (Signed)
Diagnosed in 2023 Follows with Cardiology, [facility]

## 2023-04-11 ENCOUNTER — Ambulatory Visit (INDEPENDENT_AMBULATORY_CARE_PROVIDER_SITE_OTHER): Payer: Medicare Other | Admitting: Surgery

## 2023-04-11 ENCOUNTER — Encounter: Payer: Self-pay | Admitting: Surgery

## 2023-04-11 VITALS — BP 115/74 | HR 80 | Temp 98.0°F | Ht 65.0 in | Wt 207.0 lb

## 2023-04-11 DIAGNOSIS — K5792 Diverticulitis of intestine, part unspecified, without perforation or abscess without bleeding: Secondary | ICD-10-CM | POA: Diagnosis not present

## 2023-04-11 DIAGNOSIS — R1011 Right upper quadrant pain: Secondary | ICD-10-CM

## 2023-04-11 NOTE — Progress Notes (Unsigned)
Outpatient Surgical Follow Up  04/11/2023  Joy Vaughan is an 72 y.o. female.   No chief complaint on file.   HPI: This is a very nice 72 year old female well-known to me with recent hospitalization for complicated diverticulitis She has had at least 5 episodes of diverticulitis. Most recently episode  with intramural abscess treated medically but requiring re hospitalization.  He has done well and now is eating diet.  She now endorses new different right upper quadrant pain that is sharp intermittent and might be worsening after meals.  No fevers no chills.  She denies any diarrhea or any nausea or vomiting. Recently she did have a repeat CT scan showing resolution of diverticular abscess.  Please note that I have personally reviewed the last 3 scans that she has had over the last 3 weeks  Past Medical History:  Diagnosis Date   Diverticulosis    GERD (gastroesophageal reflux disease)    Hypertension    Sleep apnea     Past Surgical History:  Procedure Laterality Date   ANKLE ARTHROSCOPY WITH REPAIR SUBLUXING TENDON     CARDIAC CATHETERIZATION     PACEMAKER IMPLANT N/A 03/11/2022   Procedure: PACEMAKER IMPLANT;  Surgeon: Marcina Millard, MD;  Location: [facility] INVASIVE CV LAB;  Service: Cardiovascular;  Laterality: N/A;   PACEMAKER PLACEMENT Left 03/11/2022   PARTIAL HYSTERECTOMY     1987   REPLACEMENT TOTAL KNEE     REPLACEMENT TOTAL KNEE Right    REPLACEMENT TOTAL KNEE Left    2016   TOTAL HIP ARTHROPLASTY Bilateral    2018,2019    Family History  Problem Relation Age of Onset   Prostate cancer Neg Hx    Bladder Cancer Neg Hx    Testicular cancer Neg Hx    Kidney cancer Neg Hx     Social History:  reports that she has quit smoking. She has never used smokeless tobacco. She reports that she does not drink alcohol and does not use drugs.  Allergies:  Allergies  Allergen Reactions   Ezetimibe     Other reaction(s): Muscle Pain   Statins     Other  reaction(s): Muscle Pain   Sulfa Antibiotics Itching and Rash    Medications reviewed.    ROS Full ROS performed and is otherwise negative other than what is stated in HPI  Physical Exam Vitals and nursing note reviewed. Exam conducted with a chaperone present.  Constitutional:      General: She is not in acute distress.    Appearance: Normal appearance. She is not ill-appearing or toxic-appearing.  Cardiovascular:     Rate and Rhythm: Normal rate and regular rhythm.     Heart sounds: No murmur heard.    No friction rub.  Pulmonary:     Effort: Pulmonary effort is normal. No respiratory distress.     Breath sounds: Normal breath sounds. No stridor. No wheezing.  Abdominal:     General: Abdomen is flat. There is no distension.     Palpations: Abdomen is soft. There is no mass.     Tenderness: There is abdominal tenderness. There is no guarding or rebound.     Hernia: No hernia is present.     Comments: Noticeable patient right upper quadrant without peritonitis.  No Murphy sign  Musculoskeletal:        General: No swelling or deformity. Normal range of motion.     Cervical back: Normal range of motion and neck supple. No rigidity or tenderness.  Skin:    General: Skin is warm and dry.     Capillary Refill: Capillary refill takes less than 2 seconds.  Neurological:     General: No focal deficit present.     Mental Status: She is alert.  Psychiatric:        Mood and Affect: Mood normal.        Behavior: Behavior normal.        Thought Content: Thought content normal.        Judgment: Judgment normal.        Assessment/Plan: 72 year old female with recurrent episode of diverticulitis last 1 being complicated,  patient reports at least 5 episodes.  I do think that we will need to proceed with colonoscopy in a few months and eventually elective sigmoid colectomy. She now  is having some right upper quadrant pain that I do think there is different from prior episode of  diverticulitis and we will need to address and appropriately workup this.  I will go ahead and start right upper quadrant ultrasound to rule out any biliary disease. No Need for emergent surgical intervention at this time. Please note  that I have spent 40 minutes in this encounter including personally reviewing images studies, coordinating her care, placing orders and performing appropriate documentation.  Sterling Big, MD [facility] General Surgeon

## 2023-04-11 NOTE — Patient Instructions (Addendum)
We will get you scheduled for an Ultrasound of your abdomen and gallbladder.  We will have you follow up here after we get these results in 2 weeks.  We have sent a referral to Dr Norma Fredrickson at [facility] for them to get you set up for a Colonoscopy. They will call you to scheduled this.    You are scheduled for an Ultrasound of your gallbladder at [facility] on Wednesday August 28th. You will need to arrive at [facility] entrance at 8:45 am and have nothing to eat or drink after midnight the night prior.   Diverticulitis  Diverticulitis happens when poop (stool) and bacteria get trapped in small pouches in the colon called diverticula. These pouches may form if you have a condition called diverticulosis. When the poop and bacteria get trapped, it can cause an infection and inflammation. Diverticulitis may cause severe stomach pain and diarrhea. It can also lead to tissue damage in your colon. This can cause bleeding or blockage. In some cases, the diverticula may burst (rupture). This can cause infected poop to go into other parts of your abdomen. What are the causes? This condition is caused by poop getting trapped in the diverticula. This allows bacteria to grow. It can lead to inflammation and infection. What increases the risk? You are more likely to get this condition if you have diverticulosis. You are also more at risk if: You are overweight or obese. You do not get enough exercise. You drink alcohol. You smoke. You eat a lot of red meat, such as beef, pork, or lamb. You do not get enough fiber. Foods high in fiber include fruits, vegetables, beans, nuts, and whole grains. You are over 55 years of age. What are the signs or symptoms? Symptoms of this condition may include: Pain and tenderness in the abdomen. This pain is often felt on the left side but may occur in other spots. Fever and chills. Nausea and vomiting. Cramping. Bloating. Changes in how often you poop. Blood in your  poop. How is this diagnosed? This condition is diagnosed based on your medical history and a physical exam. You may also have tests done to make sure there is nothing else causing your condition. These tests may include: Blood tests. Tests done on your pee (urine). A CT scan of the abdomen. You may need to have a colonoscopy. This is an exam to look at your whole large intestine. During the exam, a tube is put into the opening of your butt (anus) and then moved into your rectum, colon, and other parts of the large intestine. This exam is done to look at the diverticula. It can also see if there is something else that may be causing your symptoms. How is this treated? Most cases are mild and can be treated at home. You may be told to: Take over-the-counter pain medicine. Only eat and drink clear liquids. Take antibiotics. Rest. More severe cases may need to be treated at a hospital. Treatment may include: Not eating or drinking. Taking pain medicines. Getting antibiotics through an IV. Getting fluids and nutrition through an IV. Surgery. Follow these instructions at home: Medicines Take over-the-counter and prescription medicines only as told by your health care provider. These include fiber supplements, probiotics, and medicines to soften your poop (stool softeners). If you were prescribed antibiotics, take them as told by your provider. Do not stop using the antibiotic even if you start to feel better. Ask your provider if the medicine prescribed to  you requires you to avoid driving or using machinery. Eating and drinking  Follow the diet told by your provider. You may need to only eat and drink liquids. After your symptoms get better, you may be able to return to a more normal diet. You may be told to eat at least 25 grams (25 g) of fiber each day. Fiber makes it easier to poop. Healthy sources of fiber include: Berries. One cup has 4-8 g of fiber. Beans or lentils. One-half cup has  5-8 g of fiber. Green vegetables. One cup has 4 g of fiber. Avoid eating red meat. General instructions Do not use any products that contain nicotine or tobacco. These products include cigarettes, chewing tobacco, and vaping devices, such as e-cigarettes. If you need help quitting, ask your provider. Exercise for at least 30 minutes, 3 times a week. Exercise hard enough to raise your heart rate and break a sweat. Contact a health care provider if: Your pain gets worse. Your pooping does not go back to normal. Your symptoms do not get better with treatment. Your symptoms get worse all of a sudden. You have a fever. You vomit more than one time. Your poop is bloody, black, or tarry. This information is not intended to replace advice given to you by your health care provider. Make sure you discuss any questions you have with your health care provider. Document Revised: 04/29/2022 Document Reviewed: 04/29/2022 Elsevier Patient Education  2024 [employer].

## 2023-04-13 ENCOUNTER — Ambulatory Visit
Admission: RE | Admit: 2023-04-13 | Discharge: 2023-04-13 | Disposition: A | Discharge status: Home / Self Care | Payer: Medicare Other | Source: Ambulatory Visit | Attending: Surgery | Admitting: Surgery

## 2023-04-13 ENCOUNTER — Telehealth: Payer: Self-pay

## 2023-04-13 ENCOUNTER — Encounter: Payer: Self-pay | Admitting: Surgery

## 2023-04-13 DIAGNOSIS — R1011 Right upper quadrant pain: Secondary | ICD-10-CM | POA: Insufficient documentation

## 2023-04-13 NOTE — Telephone Encounter (Signed)
-----   Message from Travilah Maine sent at 04/13/2023 10:01 AM EDT ----- Please let her know u/s was nml, no gallstones and nml GB ----- Message ----- From: Interface, Rad Results In Sent: 04/13/2023   9:52 AM EDT To: Leafy Ro, MD

## 2023-04-13 NOTE — Telephone Encounter (Signed)
Notified patient as instructed, patient pleased. Discussed follow-up appointments, patient agrees  

## 2023-04-27 ENCOUNTER — Ambulatory Visit (INDEPENDENT_AMBULATORY_CARE_PROVIDER_SITE_OTHER): Payer: Medicare Other | Admitting: Surgery

## 2023-04-27 ENCOUNTER — Encounter: Payer: Self-pay | Admitting: Surgery

## 2023-04-27 VITALS — BP 146/75 | HR 84 | Temp 98.0°F | Ht 65.0 in | Wt 207.0 lb

## 2023-04-27 DIAGNOSIS — K5720 Diverticulitis of large intestine with perforation and abscess without bleeding: Secondary | ICD-10-CM | POA: Diagnosis not present

## 2023-04-27 NOTE — Progress Notes (Signed)
Request for Cardiology Clearance has been faxed to Dr Darrold Junker at [facility].

## 2023-04-27 NOTE — Patient Instructions (Signed)
Follow up here in 1 month.  We will get a Cardiology clearance for surgery before this.       Please call and ask to speak with a nurse if you develop questions or concerns.

## 2023-04-27 NOTE — Progress Notes (Signed)
Outpatient Surgical Follow Up  04/27/2023  Khamya Boler is an 72 y.o. female.   Chief Complaint  Patient presents with   Follow-up    HPI: Reshunda is a 71 year old female well-known to me with recurrent diverticulitis at least 5 episodes and most current 1 with evidence of abscess.  We were able to manage her medically and she did well.  She did have a recent episode of right upper quadrant abdominal pain and ultrasound have personally been reviewed by me showing no evidence of cholelithiasis.  She did have a CT scan recently that I have also personally reviewed showing evidence of diverticulitis with intramural abscess.  Overall she is doing better although she does have some intermittent abdominal pains and diarrhea.  Along with constipation. she has tried Clinical biochemist.  She has not tried any fiber.  She is tolerating p.o.  No fevers no chills.  Right upper quadrant pain has subsided.  Past Medical History:  Diagnosis Date   Diverticulosis    GERD (gastroesophageal reflux disease)    Hypertension    Sleep apnea     Past Surgical History:  Procedure Laterality Date   ANKLE ARTHROSCOPY WITH REPAIR SUBLUXING TENDON Right    CARDIAC CATHETERIZATION     PACEMAKER IMPLANT N/A 03/11/2022   Procedure: PACEMAKER IMPLANT;  Surgeon: Marcina Millard, MD;  Location: [facility] INVASIVE CV LAB;  Service: Cardiovascular;  Laterality: N/A;   PACEMAKER PLACEMENT Left 03/11/2022   PARTIAL HYSTERECTOMY     1987   REPLACEMENT TOTAL KNEE     REPLACEMENT TOTAL KNEE Right    twice   REPLACEMENT TOTAL KNEE Left    2016   TOTAL HIP ARTHROPLASTY Bilateral    2018,2019    Family History  Problem Relation Age of Onset   Prostate cancer Neg Hx    Bladder Cancer Neg Hx    Testicular cancer Neg Hx    Kidney cancer Neg Hx     Social History:  reports that she has quit smoking. She has been exposed to tobacco smoke. She has never used smokeless tobacco. She reports that she does not drink alcohol and  does not use drugs.  Allergies:  Allergies  Allergen Reactions   Ezetimibe     Other reaction(s): Muscle Pain   Statins     Other reaction(s): Muscle Pain   Sulfa Antibiotics Itching and Rash    Medications reviewed.    ROS Full ROS performed and is otherwise negative other than what is stated in HPI   BP (!) 146/75   Pulse 84   Temp 98 F (36.7 C)   Ht 5\' 5"  (1.651 m)   Wt 207 lb (93.9 kg)   SpO2 97%   BMI 34.45 kg/m   Physical Exam Vitals and nursing note reviewed. Exam conducted with a chaperone present.  Constitutional:      General: She is not in acute distress.    Appearance: Normal appearance. She is not ill-appearing or toxic-appearing.  Cardiovascular:     Rate and Rhythm: Normal rate and regular rhythm.     Heart sounds: No murmur heard.    No friction rub.  Pulmonary:     Effort: Pulmonary effort is normal. No respiratory distress.     Breath sounds: Normal breath sounds. No stridor. No wheezing.  Abdominal:     General: Abdomen is flat. There is no distension.     Palpations: Abdomen is soft. There is no mass.     Tenderness: There is mild  diffuse abdominal tenderness. There is no guarding or rebound.     Hernia: No hernia is present.     Musculoskeletal:        General: No swelling or deformity. Normal range of motion.     Cervical back: Normal range of motion and neck supple. No rigidity or tenderness.  Skin:    General: Skin is warm and dry.     Capillary Refill: Capillary refill takes less than 2 seconds.  Neurological:     General: No focal deficit present.     Mental Status: She is alert.  Psychiatric:        Mood and Affect: Mood normal.        Behavior: Behavior normal.        Thought Content: Thought content normal.        Judgment: Judgment normal.   Assessment/Plan:  72 year old female with recurrent episode of diverticulitis last 1 being complicated,  patient reports at least 5 episodes.  I do think that we will need to proceed  with colonoscopy in a few weeks  and eventually elective sigmoid colectomy.  I have personally discussed the case with GI provider.  Jeannett Senior will arrange colonoscopy in the next few weeks.  We will see her back in about a month or so and discuss further sigmoid colectomy.  I did talk to her about the rationale for recommending sigmoid colectomy and that we will likely be able to perform this robotically.  We also discussed with her diet modification to include high-fiber diet, avoidance of very fatty meals and avoidance of high sugar drinks.  She is in agreement. We will obtain cardia optimization as she does have a pacemaker. No Need for emergent surgical intervention at this time. Please note  that I have spent 40 minutes in this encounter including personally reviewing images studies, coordinating her care, placing orders and performing appropriate documentation.  Sterling Big, MD [facility] General Surgeon

## 2023-04-28 NOTE — Progress Notes (Signed)
Cardiac Clearance has been received from Dr Saralyn Pilar. The patient is cleared at Low risk for surgery.

## 2023-05-09 ENCOUNTER — Telehealth: Payer: Self-pay | Admitting: Family Medicine

## 2023-05-09 NOTE — Telephone Encounter (Signed)
Prescription Request  05/09/2023  LOV: 03/28/2023  What is the name of the medication or equipment? lorsartan  Have you contacted your pharmacy to request a refill? No   Which pharmacy would you like this sent to?  [id number] [place], [place] - [address] AT [facility] [address] [place] [address] Phone: [phone] Fax: [phone]    Patient notified that their request is being sent to the clinical staff for review and that they should receive a response within 2 business days.   Please advise at Mobile [phone] (mobile)

## 2023-05-10 ENCOUNTER — Encounter: Payer: Self-pay | Admitting: *Deleted

## 2023-05-10 ENCOUNTER — Other Ambulatory Visit: Payer: Self-pay | Admitting: Family Medicine

## 2023-05-10 DIAGNOSIS — I10 Essential (primary) hypertension: Secondary | ICD-10-CM

## 2023-05-10 MED ORDER — LOSARTAN POTASSIUM-HCTZ 100-25 MG PO TABS
1.0000 | ORAL_TABLET | Freq: Every day | ORAL | 3 refills | Status: DC
Start: 2023-05-10 — End: 2023-09-28

## 2023-05-10 NOTE — Telephone Encounter (Signed)
Pt called stating she does not get her medication managed by cardiology it is with Clent Ridges

## 2023-05-13 ENCOUNTER — Telehealth: Payer: Self-pay | Admitting: *Deleted

## 2023-05-13 ENCOUNTER — Ambulatory Visit (INDEPENDENT_AMBULATORY_CARE_PROVIDER_SITE_OTHER): Payer: Medicare Other | Admitting: *Deleted

## 2023-05-13 VITALS — Ht 65.0 in | Wt 205.0 lb

## 2023-05-13 DIAGNOSIS — Z0000 Encounter for general adult medical examination without abnormal findings: Secondary | ICD-10-CM

## 2023-05-13 NOTE — Telephone Encounter (Signed)
Called and advised pt to start back taking them in 2 months.

## 2023-05-13 NOTE — Telephone Encounter (Addendum)
Called pt and gave advised her with what Dr. Clent Ridges said. Pt stated that she has 4 pills left and started then on 05/05/2023. She wanted to know when she should start taking them back.

## 2023-05-13 NOTE — Patient Instructions (Signed)
Joy Vaughan , Thank you for taking time to come for your Medicare Wellness Visit. I appreciate your ongoing commitment to your health goals. Please review the following plan we discussed and let me know if I can assist you in the future.   Referrals/Orders/Follow-Ups/Clinician Recommendations: Please consider updating your vaccines.   This is a list of the screening recommended for you and due dates:  Health Maintenance  Topic Date Due   Hepatitis C Screening  Never done   DTaP/Tdap/Td vaccine (1 - Tdap) Never done   Zoster (Shingles) Vaccine (1 of 2) 01/15/1969   Colon Cancer Screening  Never done   Mammogram  Never done   Pneumonia Vaccine (1 of 1 - PCV) Never done   DEXA scan (bone density measurement)  Never done   COVID-19 Vaccine (4 - 2023-24 season) 04/17/2023   Flu Shot  11/14/2023*   Medicare Annual Wellness Visit  05/12/2024   HPV Vaccine  Aged Out  *Topic was postponed. The date shown is not the original due date.    Advanced directives: (Copy Requested) Please bring a copy of your health care power of attorney and living will to the office to be added to your chart at your convenience.  Next Medicare Annual Wellness Visit scheduled for next year: Yes 05/21/24 @ 12:45

## 2023-05-13 NOTE — Progress Notes (Signed)
Subjective:   Joy Vaughan is a 72 y.o. female who presents for Medicare Annual (Subsequent) preventive examination.  Visit Complete: Virtual  I connected with  [name] on 05/13/23 by a audio enabled telemedicine application and verified that I am speaking with the correct person using two identifiers.  Patient Location: Home  Provider Location: Home Office  I discussed the limitations of evaluation and management by telemedicine. The patient expressed understanding and agreed to proceed.  Because this visit was a virtual/telehealth visit, some criteria may be missing or patient reported. Any vitals not documented were not able to be obtained and vitals that have been documented are patient reported.     Cardiac Risk Factors include: Other (see comment);advanced age (>40men, >35 women);hypertension;obesity (BMI >30kg/m2), Risk factor comments: pacemaker     Objective:    Today's Vitals   05/13/23 1102 05/13/23 1103  Weight: 205 lb (93 kg)   Height: 5\' 5"  (1.651 m)   PainSc:  5    Body mass index is 34.11 kg/m.     05/13/2023   11:22 AM 03/30/2023    2:00 PM 03/30/2023    8:40 AM 03/20/2023    6:13 PM 03/20/2023   11:40 AM 03/16/2023    4:35 PM 03/11/2022   11:00 AM  Advanced Directives  Does Patient Have a Medical Advance Directive? Yes Yes Yes Yes Yes No No  Type of Estate agent of [place];Living will Living will Living will Living will     Does patient want to make changes to medical advance directive?  No - Patient declined  No - Patient declined     Copy of Healthcare Power of Attorney in Chart? No - copy requested        Would patient like information on creating a medical advance directive?  No - Patient declined  No - Patient declined  No - Patient declined No - Patient declined    Current Medications (verified) Outpatient Encounter Medications as of 05/13/2023  Medication Sig   acetaminophen (TYLENOL) 500 MG tablet Take 500 mg  by mouth every 6 (six) hours as needed for mild pain, headache or fever.   famotidine (PEPCID) 20 MG tablet Take 20 mg by mouth 2 (two) times daily.   losartan-hydrochlorothiazide (HYZAAR) 100-25 MG tablet Take 1 tablet by mouth daily.   PARoxetine (PAXIL) 20 MG tablet Take 20 mg by mouth daily.   polyethylene glycol (MIRALAX / GLYCOLAX) 17 g packet Take 17 g by mouth daily as needed for mild constipation.   metoprolol succinate (TOPROL-XL) 25 MG 24 hr tablet Take 12.5 mg by mouth daily. (Patient not taking: Reported on 05/13/2023)   MYRBETRIQ 50 MG TB24 tablet Take 50 mg by mouth daily. (Patient not taking: Reported on 05/13/2023)   Vitamin D, Ergocalciferol, (DRISDOL) 1.25 MG (50000 UNIT) CAPS capsule Take 1 capsule (50,000 Units total) by mouth every 7 (seven) days. (Patient not taking: Reported on 05/13/2023)   No facility-administered encounter medications on file as of 05/13/2023.    Allergies (verified) Ezetimibe, Statins, and Sulfa antibiotics   History: Past Medical History:  Diagnosis Date   Diverticulosis    GERD (gastroesophageal reflux disease)    Hypertension    Sleep apnea    Past Surgical History:  Procedure Laterality Date   ANKLE ARTHROSCOPY WITH REPAIR SUBLUXING TENDON Right    CARDIAC CATHETERIZATION     PACEMAKER IMPLANT N/A 03/11/2022   Procedure: PACEMAKER IMPLANT;  Surgeon: Marcina Millard, MD;  Location:  [facility] INVASIVE CV LAB;  Service: Cardiovascular;  Laterality: N/A;   PACEMAKER PLACEMENT Left 03/11/2022   PARTIAL HYSTERECTOMY     1987   REPLACEMENT TOTAL KNEE     REPLACEMENT TOTAL KNEE Right    twice   REPLACEMENT TOTAL KNEE Left    2016   TOTAL HIP ARTHROPLASTY Bilateral    2018,2019   Family History  Problem Relation Age of Onset   Prostate cancer Neg Hx    Bladder Cancer Neg Hx    Testicular cancer Neg Hx    Kidney cancer Neg Hx    Social History   Socioeconomic History   Marital status: Married    Spouse name: Tasia Catchings   Number of  children: 1   Years of education: Not on file   Highest education level: Not on file  Occupational History   Not on file  Tobacco Use   Smoking status: Former    Passive exposure: Past   Smokeless tobacco: Never  Vaping Use   Vaping status: Never Used  Substance and Sexual Activity   Alcohol use: No   Drug use: No   Sexual activity: Not Currently  Other Topics Concern   Not on file  Social History Narrative   Lives with spouse Tasia Catchings   Social Determinants of Health   Financial Resource Strain: Low Risk  (05/13/2023)   Overall Financial Resource Strain (CARDIA)    Difficulty of Paying Living Expenses: Not hard at all  Food Insecurity: No Food Insecurity (05/13/2023)   Hunger Vital Sign    Worried About Running Out of Food in the Last Year: Never true    Ran Out of Food in the Last Year: Never true  Transportation Needs: No Transportation Needs (05/13/2023)   PRAPARE - Administrator, Civil Service (Medical): No    Lack of Transportation (Non-Medical): No  Physical Activity: Inactive (05/13/2023)   Exercise Vital Sign    Days of Exercise per Week: 0 days    Minutes of Exercise per Session: 0 min  Stress: Stress Concern Present (05/13/2023)   [employer] of Occupational Health - Occupational Stress Questionnaire    Feeling of Stress : To some extent  Social Connections: Socially Integrated (05/13/2023)   Social Connection and Isolation Panel [NHANES]    Frequency of Communication with Friends and Family: More than three times a week    Frequency of Social Gatherings with Friends and Family: Twice a week    Attends Religious Services: More than 4 times per year    Active Member of [employer] or Organizations: Yes    Attends Engineer, structural: More than 4 times per year    Marital Status: Married    Tobacco Counseling Counseling given: Not Answered   Clinical Intake:  Pre-visit preparation completed: Yes  Pain : 0-10 Pain Score: 5  Pain Type:  Chronic pain Pain Location: Back Pain Orientation: Right, Lower Pain Descriptors / Indicators: Aching Pain Onset: More than a month ago Pain Frequency: Constant     BMI - recorded: 34.11 Nutritional Status: BMI > 30  Obese Nutritional Risks: None Diabetes: No  How often do you need to have someone help you when you read instructions, pamphlets, or other written materials from your doctor or pharmacy?: 1 - Never  Interpreter Needed?: No  Information entered by :: R. Kaelei Wheeler LPN   Activities of Daily Living    05/13/2023   11:06 AM 03/30/2023    2:00 PM  In your present  state of health, do you have any difficulty performing the following activities:  Hearing? 1 1  Comment wears aids   Vision? 0 0  Comment readers   Difficulty concentrating or making decisions? 1 0  Comment some memory loss   Walking or climbing stairs? 1 0  Dressing or bathing? 0 0  Doing errands, shopping? 1 0  Preparing Food and eating ? Y   Comment husband does most of the cooking   Using the Toilet? N   In the past six months, have you accidently leaked urine? Y   Comment wears pads   Do you have problems with loss of bowel control? Y   Managing your Medications? N   Managing your Finances? N   Housekeeping or managing your Housekeeping? Y   Comment husband helps     Patient Care Team: Dana Allan, MD as PCP - General (Family Medicine)  Indicate any recent Medical Services you may have received from other than [facility] providers in the past year (date may be approximate).     Assessment:   This is a routine wellness examination for [place].  Hearing/Vision screen Hearing Screening - Comments:: Wears aids Vision Screening - Comments:: readers   Goals Addressed             This Visit's Progress    Patient Stated       Wants to eat healthier and loss some weight       Depression Screen    05/13/2023   11:14 AM 03/28/2023    2:21 PM  PHQ 2/9 Scores  PHQ - 2 Score 0 0  PHQ- 9 Score 3  6    Fall Risk    05/13/2023   11:09 AM 04/11/2023    3:27 PM 03/28/2023    2:20 PM  Fall Risk   Falls in the past year? 0 0 0  Number falls in past yr: 0 0 0  Injury with Fall? 0 0 0  Risk for fall due to : No Fall Risks  No Fall Risks  Follow up Falls prevention discussed;Falls evaluation completed  Falls evaluation completed    MEDICARE RISK AT HOME: Medicare Risk at Home Any stairs in or around the home?: Yes If so, are there any without handrails?: No Home free of loose throw rugs in walkways, pet beds, electrical cords, etc?: Yes Adequate lighting in your home to reduce risk of falls?: Yes Life alert?: No Use of a cane, walker or w/c?: Yes (walker) Grab bars in the bathroom?: Yes Shower chair or bench in shower?: Yes Elevated toilet seat or a handicapped toilet?: Yes   Cognitive Function:        05/13/2023   11:22 AM  6CIT Screen  What Year? 0 points  What month? 0 points  What time? 0 points  Count back from 20 0 points  Months in reverse 0 points  Repeat phrase 2 points  Total Score 2 points    Immunizations  There is no immunization history on file for this patient.  TDAP status: Due, Education has been provided regarding the importance of this vaccine. Advised may receive this vaccine at local pharmacy or Health Dept. Aware to provide a copy of the vaccination record if obtained from local pharmacy or Health Dept. Verbalized acceptance and understanding.  Flu Vaccine status: Declined, Education has been provided regarding the importance of this vaccine but patient still declined. Advised may receive this vaccine at local pharmacy or Health Dept. Aware to  provide a copy of the vaccination record if obtained from local pharmacy or Health Dept. Verbalized acceptance and understanding.  Pneumococcal vaccine status: Declined,  Education has been provided regarding the importance of this vaccine but patient still declined. Advised may receive this vaccine at local  pharmacy or Health Dept. Aware to provide a copy of the vaccination record if obtained from local pharmacy or Health Dept. Verbalized acceptance and understanding.   Covid-19 vaccine status: Declined, Education has been provided regarding the importance of this vaccine but patient still declined. Advised may receive this vaccine at local pharmacy or Health Dept.or vaccine clinic. Aware to provide a copy of the vaccination record if obtained from local pharmacy or Health Dept. Verbalized acceptance and understanding.  Qualifies for Shingles Vaccine? Yes   Zostavax completed No   Shingrix Completed?: No.    Education has been provided regarding the importance of this vaccine. Patient has been advised to call insurance company to determine out of pocket expense if they have not yet received this vaccine. Advised may also receive vaccine at local pharmacy or Health Dept. Verbalized acceptance and understanding.  Screening Tests Health Maintenance  Topic Date Due   Hepatitis C Screening  Never done   DTaP/Tdap/Td (1 - Tdap) Never done   Zoster Vaccines- Shingrix (1 of 2) 01/15/1969   Colonoscopy  Never done   MAMMOGRAM  Never done   Pneumonia Vaccine 95+ Years old (1 of 1 - PCV) Never done   DEXA SCAN  Never done   COVID-19 Vaccine (4 - 2023-24 season) 04/17/2023   INFLUENZA VACCINE  11/14/2023 (Originally 03/17/2023)   Medicare Annual Wellness (AWV)  05/12/2024   HPV VACCINES  Aged Out    Health Maintenance  Health Maintenance Due  Topic Date Due   Hepatitis C Screening  Never done   DTaP/Tdap/Td (1 - Tdap) Never done   Zoster Vaccines- Shingrix (1 of 2) 01/15/1969   Colonoscopy  Never done   MAMMOGRAM  Never done   Pneumonia Vaccine 64+ Years old (1 of 1 - PCV) Never done   DEXA SCAN  Never done   COVID-19 Vaccine (4 - 2023-24 season) 04/17/2023    Colorectal cancer screening: Type of screening: Colonoscopy. Completed 2019. Repeat every 10 years Scheduled 05/24/2023  Mammogram  Status declines   Bone Density Status. Has a lot of health issues at this time. Will call the office when ready to have done  Lung Cancer Screening: (Low Dose CT Chest recommended if Age 16-80 years, 20 pack-year currently smoking OR have quit w/in 15years.) does not qualify.     Additional Screening:  Hepatitis C Screening: does qualify; Completed Declined  Vision Screening: Recommended annual ophthalmology exams for early detection of glaucoma and other disorders of the eye. Is the patient up to date with their annual eye exam?  Yes  Who is the provider or what is the name of the office in which the patient attends annual eye exams? [facility] If pt is not established with a provider, would they like to be referred to a provider to establish care? No .   Dental Screening: Recommended annual dental exams for proper oral hygiene    Community Resource Referral / Chronic Care Management: CRR required this visit?  No   CCM required this visit?  No     Plan:     I have personally reviewed and noted the following in the patient's chart:   Medical and social history Use of alcohol, tobacco or  illicit drugs  Current medications and supplements including opioid prescriptions. Patient is not currently taking opioid prescriptions. Functional ability and status Nutritional status Physical activity Advanced directives List of other physicians Hospitalizations, surgeries, and ER visits in previous 12 months Vitals Screenings to include cognitive, depression, and falls Referrals and appointments  In addition, I have reviewed and discussed with patient certain preventive protocols, quality metrics, and best practice recommendations. A written personalized care plan for preventive services as well as general preventive health recommendations were provided to patient.     Sydell Axon, LPN   [id number]   After Visit Summary: (MyChart) Due to this being a telephonic visit, the after  visit summary with patients personalized plan was offered to patient via MyChart   Nurse Notes: None

## 2023-05-13 NOTE — Telephone Encounter (Signed)
Called patient to perform her AWV. While reviewing patient's medications she advised that she has been taking the Vitamin D prescribed to her every day since she picked it up from the pharmacy. Patient stated that she just thought it was suppose to be every day and did not realized it was once a week. Please reach out to patient with what she should do now.

## 2023-05-24 ENCOUNTER — Ambulatory Visit: Payer: Medicare Other | Admitting: Anesthesiology

## 2023-05-24 ENCOUNTER — Encounter
Admission: RE | Disposition: A | Discharge status: Home / Self Care | Payer: Self-pay | Source: Ambulatory Visit | Attending: Gastroenterology

## 2023-05-24 ENCOUNTER — Ambulatory Visit
Admission: RE | Admit: 2023-05-24 | Discharge: 2023-05-24 | Disposition: A | Discharge status: Home / Self Care | Payer: Medicare Other | Source: Ambulatory Visit | Attending: Gastroenterology | Admitting: Gastroenterology

## 2023-05-24 ENCOUNTER — Encounter: Payer: Self-pay | Admitting: *Deleted

## 2023-05-24 DIAGNOSIS — Z09 Encounter for follow-up examination after completed treatment for conditions other than malignant neoplasm: Secondary | ICD-10-CM | POA: Diagnosis present

## 2023-05-24 DIAGNOSIS — K640 First degree hemorrhoids: Secondary | ICD-10-CM | POA: Diagnosis not present

## 2023-05-24 DIAGNOSIS — K5730 Diverticulosis of large intestine without perforation or abscess without bleeding: Secondary | ICD-10-CM | POA: Diagnosis present

## 2023-05-24 DIAGNOSIS — K219 Gastro-esophageal reflux disease without esophagitis: Secondary | ICD-10-CM | POA: Diagnosis not present

## 2023-05-24 DIAGNOSIS — Z950 Presence of cardiac pacemaker: Secondary | ICD-10-CM | POA: Insufficient documentation

## 2023-05-24 DIAGNOSIS — I509 Heart failure, unspecified: Secondary | ICD-10-CM | POA: Insufficient documentation

## 2023-05-24 DIAGNOSIS — Z87891 Personal history of nicotine dependence: Secondary | ICD-10-CM | POA: Insufficient documentation

## 2023-05-24 DIAGNOSIS — E039 Hypothyroidism, unspecified: Secondary | ICD-10-CM | POA: Diagnosis not present

## 2023-05-24 DIAGNOSIS — I110 Hypertensive heart disease with heart failure: Secondary | ICD-10-CM | POA: Diagnosis not present

## 2023-05-24 DIAGNOSIS — G4730 Sleep apnea, unspecified: Secondary | ICD-10-CM | POA: Diagnosis not present

## 2023-05-24 HISTORY — PX: COLONOSCOPY WITH PROPOFOL: SHX5780

## 2023-05-24 SURGERY — COLONOSCOPY WITH PROPOFOL
Anesthesia: General

## 2023-05-24 MED ORDER — PROPOFOL 10 MG/ML IV BOLUS
INTRAVENOUS | Status: DC | PRN
Start: 2023-05-24 — End: 2023-05-24
  Administered 2023-05-24: 100 mg via INTRAVENOUS
  Administered 2023-05-24: 30 mg via INTRAVENOUS

## 2023-05-24 MED ORDER — SODIUM CHLORIDE 0.9 % IV SOLN: INTRAVENOUS | Status: DC

## 2023-05-24 MED ORDER — LIDOCAINE HCL (CARDIAC) PF 100 MG/5ML IV SOSY
PREFILLED_SYRINGE | INTRAVENOUS | Status: DC | PRN
  Administered 2023-05-24: 20 mg via INTRAVENOUS

## 2023-05-24 MED ORDER — PROPOFOL 500 MG/50ML IV EMUL
INTRAVENOUS | Status: DC | PRN
  Administered 2023-05-24: 125 ug/kg/min via INTRAVENOUS

## 2023-05-24 MED ORDER — SODIUM CHLORIDE 0.9 % IV SOLN: INTRAVENOUS | Status: DC | PRN

## 2023-05-24 NOTE — H&P (Signed)
Outpatient short stay form Pre-procedure 05/24/2023  Regis Bill, MD  Primary Physician: Dana Allan, MD  Reason for visit:  History of complicated diverticulitis  History of present illness:    72 y/o lady with history of hypertension, OSA, and diverticulitis here for colonoscopy. Had complicated diverticulitis a few months ago with documented resolution on CT. No abdominal pain now. No significant abdominal surgeries. No blood thinners. No family history of GI malignancies. Last colonoscopy a few years ago was unremarkable.   No current facility-administered medications for this encounter.  Medications Prior to Admission  Medication Sig Dispense Refill Last Dose   acetaminophen (TYLENOL) 500 MG tablet Take 500 mg by mouth every 6 (six) hours as needed for mild pain, headache or fever.   05/23/2023   famotidine (PEPCID) 20 MG tablet Take 20 mg by mouth 2 (two) times daily.   05/23/2023   losartan-hydrochlorothiazide (HYZAAR) 100-25 MG tablet Take 1 tablet by mouth daily. 90 tablet 3 05/23/2023   PARoxetine (PAXIL) 20 MG tablet Take 20 mg by mouth daily.   05/23/2023   Vitamin D, Ergocalciferol, (DRISDOL) 1.25 MG (50000 UNIT) CAPS capsule Take 1 capsule (50,000 Units total) by mouth every 7 (seven) days. 12 capsule 0 Past Week   metoprolol succinate (TOPROL-XL) 25 MG 24 hr tablet Take 12.5 mg by mouth daily. (Patient not taking: Reported on 05/13/2023)      MYRBETRIQ 50 MG TB24 tablet Take 50 mg by mouth daily. (Patient not taking: Reported on 05/13/2023)      polyethylene glycol (MIRALAX / GLYCOLAX) 17 g packet Take 17 g by mouth daily as needed for mild constipation.        Allergies  Allergen Reactions   Ezetimibe     Other reaction(s): Muscle Pain   Statins     Other reaction(s): Muscle Pain   Sulfa Antibiotics Itching and Rash     Past Medical History:  Diagnosis Date   Diverticulosis    GERD (gastroesophageal reflux disease)    Hypertension    Sleep apnea      Review of systems:  Otherwise negative.    Physical Exam  Gen: Alert, oriented. Appears stated age.  HEENT: PERRLA. Lungs: No respiratory distress CV: RRR Abd: soft, benign, no masses Ext: No edema    Planned procedures: Proceed with colonoscopy. The patient understands the nature of the planned procedure, indications, risks, alternatives and potential complications including but not limited to bleeding, infection, perforation, damage to internal organs and possible oversedation/side effects from anesthesia. The patient agrees and gives consent to proceed.  Please refer to procedure notes for findings, recommendations and patient disposition/instructions.     Regis Bill, MD [facility]

## 2023-05-24 NOTE — Anesthesia Preprocedure Evaluation (Signed)
Anesthesia Evaluation  Patient identified by MRN, date of birth, ID band Patient awake    Reviewed: Allergy & Precautions, NPO status , Patient's Chart, lab work & pertinent test results  Airway Mallampati: II  TM Distance: >3 FB     Dental  (+) Teeth Intact, Implants   Pulmonary sleep apnea , former smoker   breath sounds clear to auscultation       Cardiovascular Exercise Tolerance: Poor hypertension, Pt. on medications +CHF  + pacemaker  Rhythm:Regular Rate:Normal     Neuro/Psych negative neurological ROS     GI/Hepatic Neg liver ROS,GERD  Medicated,,  Endo/Other  Hypothyroidism    Renal/GU negative Renal ROS     Musculoskeletal   Abdominal Normal abdominal exam  (+)   Peds negative pediatric ROS (+)  Hematology  (+) Blood dyscrasia, anemia   Anesthesia Other Findings   Reproductive/Obstetrics                             Anesthesia Physical Anesthesia Plan  ASA: 3  Anesthesia Plan: General   Post-op Pain Management:    Induction: Intravenous  PONV Risk Score and Plan:   Airway Management Planned: Natural Airway and Nasal Cannula  Additional Equipment:   Intra-op Plan:   Post-operative Plan:   Informed Consent:   Plan Discussed with:   Anesthesia Plan Comments:        Anesthesia Quick Evaluation

## 2023-05-24 NOTE — Op Note (Signed)
[facility] Patient Name: Joy Vaughan Procedure Date: 05/24/2023 10:47 AM MRN: [id number] Account #: [id number] Date of Birth: July 10, 1951 Admit Type: Outpatient Age: 72 Room: [facility] ENDO ROOM 1 Gender: Female Note Status: Finalized Instrument Name: Colonoscope [id number] Procedure:             Colonoscopy Indications:           Follow-up of diverticulitis Providers:             Eather Colas MD, MD Medicines:             Monitored Anesthesia Care Complications:         No immediate complications. Procedure:             Pre-Anesthesia Assessment:                        - Prior to the procedure, a History and Physical was                         performed, and patient medications and allergies were                         reviewed. The patient is competent. The risks and                         benefits of the procedure and the sedation options and                         risks were discussed with the patient. All questions                         were answered and informed consent was obtained.                         Patient identification and proposed procedure were                         verified by the physician, the nurse, the                         anesthesiologist, the anesthetist and the technician                         in the endoscopy suite. Mental Status Examination:                         alert and oriented. Airway Examination: normal                         oropharyngeal airway and neck mobility. Respiratory                         Examination: clear to auscultation. CV Examination:                         normal. Prophylactic Antibiotics: The patient does not                         require prophylactic antibiotics. Prior  Anticoagulants: The patient has taken no anticoagulant                         or antiplatelet agents. ASA Grade Assessment: III - A                         patient with severe systemic  disease. After reviewing                         the risks and benefits, the patient was deemed in                         satisfactory condition to undergo the procedure. The                         anesthesia plan was to use monitored anesthesia care                         (MAC). Immediately prior to administration of                         medications, the patient was re-assessed for adequacy                         to receive sedatives. The heart rate, respiratory                         rate, oxygen saturations, blood pressure, adequacy of                         pulmonary ventilation, and response to care were                         monitored throughout the procedure. The physical                         status of the patient was re-assessed after the                         procedure.                        After obtaining informed consent, the colonoscope was                         passed under direct vision. Throughout the procedure,                         the patient's blood pressure, pulse, and oxygen                         saturations were monitored continuously. The                         Colonoscope was introduced through the anus and                         advanced to the the cecum, identified by appendiceal  orifice and ileocecal valve. The colonoscopy was                         performed without difficulty. The patient tolerated                         the procedure well. The quality of the bowel                         preparation was good. The ileocecal valve, appendiceal                         orifice, and rectum were photographed. Findings:      The perianal and digital rectal examinations were normal.      Scattered large-mouthed and small-mouthed diverticula were found in the       transverse colon, hepatic flexure, ascending colon and cecum.      Many large-mouthed and small-mouthed diverticula were found in the       sigmoid colon  and descending colon.      Internal hemorrhoids were found during retroflexion. The hemorrhoids       were Grade I (internal hemorrhoids that do not prolapse).      The exam was otherwise without abnormality on direct and retroflexion       views. Impression:            - Diverticulosis in the transverse colon, at the                         hepatic flexure, in the ascending colon and in the                         cecum.                        - Diverticulosis in the sigmoid colon and in the                         descending colon.                        - Internal hemorrhoids.                        - The examination was otherwise normal on direct and                         retroflexion views.                        - No specimens collected. Recommendation:        - Discharge patient to home.                        - Resume previous diet.                        - Continue present medications.                        - Repeat colonoscopy is not recommended due to current  age (5 years or older) for surveillance.                        - Return to referring physician as previously                         scheduled. Procedure Code(s):     --- Professional ---                        [phone], Colonoscopy, flexible; diagnostic, including                         collection of specimen(s) by brushing or washing, when                         performed (separate procedure) Diagnosis Code(s):     --- Professional ---                        K64.0, First degree hemorrhoids                        K57.32, Diverticulitis of large intestine without                         perforation or abscess without bleeding                        K57.30, Diverticulosis of large intestine without                         perforation or abscess without bleeding CPT copyright 2022 American Medical Association. All rights reserved. The codes documented in this report are preliminary and upon  coder review may  be revised to meet current compliance requirements. Eather Colas MD, MD 05/24/2023 11:29:05 AM Number of Addenda: 0 Note Initiated On: 05/24/2023 10:47 AM Scope Withdrawal Time: 0 hours 5 minutes 15 seconds  Total Procedure Duration: 0 hours 11 minutes 27 seconds  Estimated Blood Loss:  Estimated blood loss: none.      [facility]

## 2023-05-24 NOTE — Interval H&P Note (Signed)
History and Physical Interval Note:  05/24/2023 10:54 AM  Joy Vaughan  has presented today for surgery, with the diagnosis of h/o diverticulitis.  The various methods of treatment have been discussed with the patient and family. After consideration of risks, benefits and other options for treatment, the patient has consented to  Procedure(s): COLONOSCOPY WITH PROPOFOL (N/A) as a surgical intervention.  The patient's history has been reviewed, patient examined, no change in status, stable for surgery.  I have reviewed the patient's chart and labs.  Questions were answered to the patient's satisfaction.     Regis Bill  Ok to proceed with colonoscopy

## 2023-05-24 NOTE — Transfer of Care (Signed)
Immediate Anesthesia Transfer of Care Note  Patient: Allen Kell  Procedure(s) Performed: COLONOSCOPY WITH PROPOFOL  Patient Location: PACU  Anesthesia Type:General  Level of Consciousness: drowsy  Airway & Oxygen Therapy: Patient Spontanous Breathing  Post-op Assessment: Report given to RN and Post -op Vital signs reviewed and stable  Post vital signs: Reviewed and stable  Last Vitals:  Vitals Value Taken Time  BP 99/39 05/24/23 1121  Temp 35.7 C 05/24/23 1118  Pulse 73 05/24/23 1124  Resp 20 05/24/23 1124  SpO2 95 % 05/24/23 1124  Vitals shown include unfiled device data.  Last Pain:  Vitals:   05/24/23 1118  TempSrc: Temporal         Complications: No notable events documented.

## 2023-05-24 NOTE — Anesthesia Postprocedure Evaluation (Signed)
Anesthesia Post Note  Patient: Joy Vaughan  Procedure(s) Performed: COLONOSCOPY WITH PROPOFOL  Patient location during evaluation: PACU Anesthesia Type: General Level of consciousness: awake and awake and alert Pain management: satisfactory to patient Vital Signs Assessment: post-procedure vital signs reviewed and stable Respiratory status: spontaneous breathing Cardiovascular status: stable Anesthetic complications: no   No notable events documented.   Last Vitals:  Vitals:   05/24/23 1128 05/24/23 1138  BP: 116/86 131/63  Pulse:    Resp:    Temp:    SpO2:      Last Pain:  Vitals:   05/24/23 1138  TempSrc:   PainSc: 0-No pain                 VAN STAVEREN,Sabien Umland

## 2023-05-25 ENCOUNTER — Encounter: Payer: Self-pay | Admitting: Gastroenterology

## 2023-05-30 ENCOUNTER — Telehealth: Payer: Self-pay

## 2023-05-30 ENCOUNTER — Telehealth: Payer: Self-pay | Admitting: Family Medicine

## 2023-05-30 ENCOUNTER — Ambulatory Visit (INDEPENDENT_AMBULATORY_CARE_PROVIDER_SITE_OTHER): Payer: Medicare Other | Admitting: Surgery

## 2023-05-30 ENCOUNTER — Telehealth: Payer: Self-pay | Admitting: Surgery

## 2023-05-30 ENCOUNTER — Encounter: Payer: Self-pay | Admitting: Surgery

## 2023-05-30 VITALS — BP 135/73 | HR 84 | Temp 98.0°F | Ht 65.0 in | Wt 206.6 lb

## 2023-05-30 DIAGNOSIS — K5720 Diverticulitis of large intestine with perforation and abscess without bleeding: Secondary | ICD-10-CM

## 2023-05-30 MED ORDER — METRONIDAZOLE 500 MG PO TABS: ORAL_TABLET | ORAL | 0 refills | Status: DC

## 2023-05-30 MED ORDER — NEOMYCIN SULFATE 500 MG PO TABS: ORAL_TABLET | ORAL | 0 refills | Status: DC

## 2023-05-30 MED ORDER — POLYETHYLENE GLYCOL 3350 17 GM/SCOOP PO POWD: ORAL | 0 refills | Status: DC

## 2023-05-30 NOTE — Progress Notes (Signed)
Outpatient Surgical Follow Up  05/30/2023  Joy Vaughan is an 72 y.o. female.   Chief Complaint  Patient presents with   Follow-up    RUQ    HPI: Joy Vaughan is a 72 year old female well-known to me with recurrent diverticulitis at least 5 episodes and most current 1 with evidence of abscess.  She did have a CT scan recently that I have also personally reviewed showing evidence of diverticulitis with intramural abscess.  Overall she is doing better although she does have some intermittent abdominal pains and diarrhea.   Along with constipation. she has tried Clinical biochemist.  She has not tried any fiber.  She is tolerating p.o.  No fevers no chills.  Right upper quadrant pain has subsided.  Has completed a colonoscopy that I have personally reviewed showing evidence of diffuse diverticulosis.  No evidence of any concerning lesions for cancer.  Past Medical History:  Diagnosis Date   Diverticulosis    GERD (gastroesophageal reflux disease)    Hypertension    Sleep apnea     Past Surgical History:  Procedure Laterality Date   ANKLE ARTHROSCOPY WITH REPAIR SUBLUXING TENDON Right    CARDIAC CATHETERIZATION     COLONOSCOPY WITH PROPOFOL N/A 05/24/2023   Procedure: COLONOSCOPY WITH PROPOFOL;  Surgeon: Regis Bill, MD;  Location: [facility] ENDOSCOPY;  Service: Endoscopy;  Laterality: N/A;   PACEMAKER IMPLANT N/A 03/11/2022   Procedure: PACEMAKER IMPLANT;  Surgeon: Marcina Millard, MD;  Location: [facility] INVASIVE CV LAB;  Service: Cardiovascular;  Laterality: N/A;   PACEMAKER PLACEMENT Left 03/11/2022   PARTIAL HYSTERECTOMY     1987   REPLACEMENT TOTAL KNEE     REPLACEMENT TOTAL KNEE Right    twice   REPLACEMENT TOTAL KNEE Left    2016   TOTAL HIP ARTHROPLASTY Bilateral    2018,2019    Family History  Problem Relation Age of Onset   Prostate cancer Neg Hx    Bladder Cancer Neg Hx    Testicular cancer Neg Hx    Kidney cancer Neg Hx     Social History:  reports that she has quit  smoking. She has been exposed to tobacco smoke. She has never used smokeless tobacco. She reports that she does not drink alcohol and does not use drugs.  Allergies:  Allergies  Allergen Reactions   Ezetimibe     Other reaction(s): Muscle Pain   Statins     Other reaction(s): Muscle Pain   Sulfa Antibiotics Itching and Rash    Medications reviewed.    ROS Full ROS performed and is otherwise negative other than what is stated in HPI   BP 135/73 (BP Location: Right Arm, Patient Position: Sitting, Cuff Size: Large)   Pulse 84   Temp 98 F (36.7 C) (Oral)   Ht 5\' 5"  (1.651 m)   Wt 206 lb 9.6 oz (93.7 kg)   SpO2 94%   BMI 34.38 kg/m   Physical Exam  Physical Exam Vitals and nursing note reviewed. Exam conducted with a chaperone present.  Constitutional:      General: She is not in acute distress.    Appearance: Normal appearance. She is not ill-appearing or toxic-appearing.  Cardiovascular:     Rate and Rhythm: Normal rate and regular rhythm.     Heart sounds: No murmur heard.    No friction rub.  Pulmonary:     Effort: Pulmonary effort is normal. No respiratory distress.     Breath sounds: Normal breath sounds. No stridor. No  wheezing.  Abdominal:     General: Abdomen is flat. There is no distension.     Palpations: Abdomen is soft. There is no mass.     Tenderness: There is no tenderness There is no guarding or rebound.     Hernia: No hernia is present.     Musculoskeletal:   Right hip scar and limited mobility of Right Lower extremity due to hip and knee replacements     General: No swelling or deformity. Normal range of motion.     Cervical back: Normal range of motion and neck supple. No rigidity or tenderness.  Skin:    General: Skin is warm and dry.     Capillary Refill: Capillary refill takes less than 2 seconds.  Neurological:     General: No focal deficit present.     Mental Status: She is alert.  Psychiatric:        Mood and Affect: Mood normal.         Behavior: Behavior normal.        Thought Content: Thought content normal.        Judgment: Judgment normal.    Assessment/Plan: 72 year old female with recurrent episode of diverticulitis last 1 being complicated w abscess,  patient reports at least 5 episodes.  I do think that will benefit from elective sigmoid colectomy.   I did talk to her about the rationale for recommending sigmoid colectomy and that we will likely be able to perform this robotically.   Discussed with her the procedure in detail.  Risk, benefits and possible medication include but not limited to: Bleeding, infection, anastomotic leak, the potential to require an ostomy.  She understands and wishes to proceed.  Also discussed measures to decrease postoperative complications such as bowel prep with antibiotics and perioperative antibiotics.  Please note  that I have spent 40 minutes in this encounter including personally reviewing images studies, coordinating her care, placing orders and performing appropriate documentation.  Sterling Big, MD [facility] General Surgeon

## 2023-05-30 NOTE — Telephone Encounter (Signed)
Faxed cardiac clearance to Dr. Marcina Millard at [phone].

## 2023-05-30 NOTE — Telephone Encounter (Signed)
Patient has been advised of Pre-Admission date/time, and Surgery date at [facility].  Surgery Date: 06/09/23 Preadmission Testing Date: 06/02/23 (phone 1p-4p)  Patient has been made aware to call [phone], between 1-3:00pm the day before surgery, to find out what time to arrive for surgery.

## 2023-05-30 NOTE — Telephone Encounter (Signed)
Patient called and needs a med refill on PARoxetine (PAXIL) 20 MG tablet . Patient states does not have any left took last one today. Please send to the [facility] in [place]

## 2023-05-30 NOTE — Patient Instructions (Addendum)
Please pick up your prescriptions at your pharmacy.    We have discussed removing a portion of your damaged colon through 4 small incisions today. We will schedule this surgery at [facility] with Dr. Everlene Farrier. Please plan a hospital stay of 3-7 days for surgery and recovery time.  We will have you complete a bowel prep prior to your surgery. Please see information provided.  You have also been given a (Blue) Pre-Care Sheet with more information regarding your particular surgery. Our surgery scheduler will call you to verify surgery date and to go over information.  Please review all information given.  You will need to arrange to be out of work for approximately 2 weeks and then you may return with a lifting restriction for 4 more weeks. If you have FMLA or Disability paperwork that needs to be filled out, please have your company fax your paperwork to [phone] or you may drop this by either office. This paperwork will be filled out within 3 days after your surgery has been completed.  Please call our office with any questions or concerns prior to your scheduled surgery.   Laparoscopic Colectomy Laparoscopic colectomy is surgery to remove part or all of the large intestine (colon). This procedure may be used to treat several conditions, including: Inflammation and infection of the colon (diverticulitis). Tumors or masses in the colon. Inflammatory bowel disease, such as Crohn disease or ulcerative colitis. Colectomy is an option when symptoms cannot be controlled with medicines. Bleeding from the colon that cannot be controlled by another method. Blockage or obstruction of the colon.  Tell a health care provider about: Any allergies you have. All medicines you are taking, including vitamins, herbs, eye drops, creams, and over-the-counter medicines. Any problems you or family members have had with anesthetic medicines. Any blood disorders you have. Any surgeries you have had. Any medical  conditions you have. What are the risks? Generally, this is a safe procedure. However, problems may occur, including: Infection. Bleeding. Allergic reactions to medicines or dyes. Damage to other structures or organs. Leaking from where the colon was sewn together. Future blockage of the small intestines from scar tissue. Another surgery may be needed to repair this. Needing to convert to an open procedure. Complications such as damage to other organs or excessive bleeding may require the surgeon to convert from a laparoscopic procedure to an open procedure. This involves making a larger incision in the abdomen.  What happens before the procedure?  Medicines Ask your health care provider about: Changing or stopping your regular medicines. This is especially important if you are taking diabetes medicines or blood thinners. Taking medicines such as aspirin and ibuprofen. These medicines can thin your blood. Do not take these medicines before your procedure if your health care provider instructs you not to. You may be given antibiotic medicine to clean out bacteria from your colon. Follow the directions carefully and take the medicine at the correct time. General instructions You may be prescribed an oral bowel prep to clean out your colon in preparation for the surgery: Follow instructions from your health care provider about how to do this. Do not eat or drink anything else after you have started the bowel prep, unless your health care provider tells you it is safe to do so. Do not use any products that contain nicotine or tobacco, such as cigarettes and e-cigarettes. If you need help quitting, ask your health care provider. What happens during the procedure? To reduce your risk of  infection: Your health care team will wash or sanitize their hands. Your skin will be washed with soap. An IV tube will be inserted into one of your veins to deliver fluid and medication. You will be given one  of the following: A medicine to help you relax (sedative). A medicine to make you fall asleep (general anesthetic). Small monitors will be connected to your body. They will be used to check your heart, blood pressure, and oxygen level. A breathing tube may be placed into your lungs during the procedure. A thin, flexible tube (catheter) will be placed into your bladder to drain urine. A tube may be placed through your nose and into your stomach to drain stomach fluids (nasogastric tube, or NG tube). Your abdomen will be filled with air so it expands. This gives the surgeon more room to operate and makes your organs easier to see. Several small cuts (incisions) will be made in your abdomen. A thin, lighted tube with a tiny camera on the end (laparoscope) will be put through one of the small incisions. The camera on the laparoscope will send a picture to a computer screen in the operating room. This will give the surgeon a good view inside your abdomen. Hollow tubes will be put through the other small incisions in your abdomen. The tools that are needed for the procedure will be put through these tubes. Clamps or staples will be put on both ends of the diseased part of the colon. The part of the intestine between the clamps or staples will be removed. If possible, the ends of the healthy colon that remain will be stitched (sutured) or stapled together to allow your body to pass waste (stool). Sometimes, the remaining colon cannot be stitched back together. If this is the case, a colostomy will be needed. If you need a colostomy: An opening to the outside of your body (stoma) will be made through your abdomen. The end of your colon will be brought to the opening. It will be stitched to the skin. A bag will be attached to the opening. Stool will drain into this removable bag. The colostomy may be temporary or permanent. The incisions from the colectomy will be closed with sutures or staples. The  procedure may vary among health care providers and hospitals. What happens after the procedure? Your blood pressure, heart rate, breathing rate, and blood oxygen level will be monitored until the medicines you were given have worn off. You will receive fluids through an IV tube until your bowels start to work properly. Once your bowels are working again, you will be given clear liquids first and then solid food as tolerated. You will be given medicines to control your pain and nausea, if needed. Do not drive for 24 hours if you were given a sedative. This information is not intended to replace advice given to you by your health care provider. Make sure you discuss any questions you have with your health care provider. Document Released: 10/23/2002 Document Revised: 05/03/2016 Document Reviewed: 05/03/2016 Elsevier Interactive Patient Education  2018 [employer].     Laparoscopic Colectomy, Care After This sheet gives you information about how to care for yourself after your procedure. Your health care provider may also give you more specific instructions. If you have problems or questions, contact your health care provider. What can I expect after the procedure? After your procedure, it is common to have the following: Pain in your abdomen, especially in the incision areas. You will be  given medicine to control the pain. Tiredness. This is a normal part of the recovery process. Your energy level will return to normal over the next several weeks. Changes in your bowel movements, such as constipation or needing to go more often. Talk with your health care provider about how to manage this.  Follow these instructions at home: Medicines Take over-the-counter and prescription medicines only as told by your health care provider. Do not drive or use heavy machinery while taking prescription pain medicine. Do not drink alcohol while taking prescription pain medicine. If you were prescribed an  antibiotic medicine, use it as told by your health care provider. Do not stop using the antibiotic even if you start to feel better. Incision care Follow instructions from your health care provider about how to take care of your incision areas. Make sure you: Keep your incisions clean and dry. Wash your hands with soap and water before and after applying medicine to the areas, and before and after changing your bandage (dressing). If soap and water are not available, use hand sanitizer. Change your dressing as told by your health care provider. Leave stitches (sutures), skin glue, or adhesive strips in place. These skin closures may need to stay in place for 2 weeks or longer. If adhesive strip edges start to loosen and curl up, you may trim the loose edges. Do not remove adhesive strips completely unless your health care provider tells you to do that. Do not wear tight clothing over the incisions. Tight clothing may rub and irritate the incision areas, which may cause the incisions to open. Do not take baths, swim, or use a hot tub until your health care provider approves. Ask your health care provider if you can take showers. You may only be allowed to take sponge baths for bathing. Check your incision area every day for signs of infection. Check for: More redness, swelling, or pain. More fluid or blood. Warmth. Pus or a bad smell. Activity Avoid lifting anything that is heavier than 10 lb (4.5 kg) for 2 weeks or until your health care provider says it is okay. You may resume normal activities as told by your health care provider. Ask your health care provider what activities are safe for you. Take rest breaks during the day as needed. Eating and drinking Follow instructions from your health care provider about what you can eat after surgery. To prevent or treat constipation while you are taking prescription pain medicine, your health care provider may recommend that you: Drink enough fluid to  keep your urine clear or pale yellow. Take over-the-counter or prescription medicines. Eat foods that are high in fiber, such as fresh fruits and vegetables, whole grains, and beans. Limit foods that are high in fat and processed sugars, such as fried and sweet foods. General instructions Ask your health care provider when you will need an appointment to get your sutures or staples removed. Keep all follow-up visits as told by your health care provider. This is important. Contact a health care provider if: You have more redness, swelling, or pain around your incisions. You have more fluid or blood coming from the incisions. Your incisions feel warm to the touch. You have pus or a bad smell coming from your incisions or your dressing. You have a fever. You have an incision that breaks open (edges not staying together) after sutures or staples have been removed. Get help right away if: You develop a rash. You have chest pain or difficulty breathing.  You have pain or swelling in your legs. You feel light-headed or you faint. Your abdomen swells (becomes distended). You have nausea or vomiting. You have blood in your stool (feces). This information is not intended to replace advice given to you by your health care provider. Make sure you discuss any questions you have with your health care provider. Document Released: 02/19/2005 Document Revised: 05/03/2016 Document Reviewed: 05/03/2016 Elsevier Interactive Patient Education  [employer].

## 2023-05-31 ENCOUNTER — Other Ambulatory Visit: Payer: Self-pay

## 2023-05-31 ENCOUNTER — Telehealth: Payer: Self-pay

## 2023-05-31 NOTE — Telephone Encounter (Signed)
Patient called and would like to know status of refill . Please call patient.

## 2023-05-31 NOTE — Telephone Encounter (Signed)
Rx request sent to provider

## 2023-05-31 NOTE — Telephone Encounter (Signed)
Received cardiac clearance from Dr. Darrold Junker. Pt's risk assessment is low and is optimized for surgery.

## 2023-06-01 ENCOUNTER — Other Ambulatory Visit: Payer: Self-pay | Admitting: Family Medicine

## 2023-06-01 NOTE — Telephone Encounter (Signed)
Pt informed that medication sent to pharmacy.

## 2023-06-01 NOTE — Telephone Encounter (Signed)
Patient just called back again about her Prescription. She said she has been out of it for 2 days. Can someone give her a call when it's ready. Her number is [phone].

## 2023-06-02 ENCOUNTER — Inpatient Hospital Stay: Admission: RE | Admit: 2023-06-02 | Payer: Medicare Other | Source: Ambulatory Visit

## 2023-06-02 ENCOUNTER — Telehealth: Payer: Self-pay | Admitting: Surgery

## 2023-06-02 NOTE — Telephone Encounter (Signed)
Patient calls today to cancel her robotic sigmoid colectomy surgery for 06/09/23. Patient states that she does not want to do surgery at this time.  Will call us back after the new year if she decides to reschedule.  She didn't want to make return follow up appointment either.  Surgery at patient's request is cancelled.

## 2023-06-09 ENCOUNTER — Inpatient Hospital Stay: Admit: 2023-06-09 | Payer: Medicare Other | Admitting: Surgery

## 2023-06-09 SURGERY — XI ROBOT ASSISTED SIGMOID COLECTOMY
Anesthesia: General

## 2023-09-19 ENCOUNTER — Other Ambulatory Visit: Payer: Self-pay

## 2023-09-19 ENCOUNTER — Telehealth: Payer: Self-pay | Admitting: Family Medicine

## 2023-09-19 MED ORDER — OMEPRAZOLE 20 MG PO CPDR: 20.0000 mg | DELAYED_RELEASE_CAPSULE | Freq: Every day | ORAL | 3 refills | Status: DC

## 2023-09-19 NOTE — Telephone Encounter (Signed)
This has net been prescribed since 2015.  Is she taking this over the counter?  Please call patient to verify exactly what she is taking, dose, how often, who prescribed it last and when she last had it filled.

## 2023-09-19 NOTE — Telephone Encounter (Signed)
Called pt and she stated that is it prescribed. It was prescribed by her old pcp. Pt said that is 20 mg with breakfast. She stated it has been a while since it has been filled, because they switched and the other did not work.

## 2023-09-19 NOTE — Telephone Encounter (Signed)
Copied from CRM [id number]. Topic: Clinical - Medication Refill >> Sep 19, 2023 12:13 PM Kathryne Eriksson wrote: Most Recent Primary Care Visit:  Provider: Sydell Axon C  Department: [facility]  Visit Type: MEDICARE AWV, SEQUENTIAL  Date: 05/13/2023  Medication: Omeprazole 20 MG   Has the patient contacted their pharmacy? No (Agent: If no, request that the patient contact the pharmacy for the refill. If patient does not wish to contact the pharmacy document the reason why and proceed with request.) (Agent: If yes, when and what did the pharmacy advise?)  Is this the correct pharmacy for this prescription? Yes If no, delete pharmacy and type the correct one.  This is the patient's preferred pharmacy:  [id number] [place], [place] - [address] AT [facility] [address] [place] [address] Phone: [phone] Fax: [phone]   Has the prescription been filled recently? No  Is the patient out of the medication? Yes  Has the patient been seen for an appointment in the last year OR does the patient have an upcoming appointment? Yes  Can we respond through MyChart? Yes  Agent: Please be advised that Rx refills may take up to 3 business days. We ask that you follow-up with your pharmacy.

## 2023-09-23 NOTE — Telephone Encounter (Signed)
 Last read by Woodson He at 10:50 AM on 09/20/2023.

## 2023-09-28 ENCOUNTER — Encounter: Payer: Self-pay | Admitting: Family Medicine

## 2023-09-28 ENCOUNTER — Ambulatory Visit: Payer: Medicare Other | Admitting: Family Medicine

## 2023-09-28 VITALS — BP 120/66 | HR 65 | Temp 98.2°F | Resp 18 | Ht 65.0 in | Wt 212.0 lb

## 2023-09-28 DIAGNOSIS — I10 Essential (primary) hypertension: Secondary | ICD-10-CM

## 2023-09-28 DIAGNOSIS — F39 Unspecified mood [affective] disorder: Secondary | ICD-10-CM

## 2023-09-28 DIAGNOSIS — M545 Low back pain, unspecified: Secondary | ICD-10-CM

## 2023-09-28 DIAGNOSIS — G8929 Other chronic pain: Secondary | ICD-10-CM

## 2023-09-28 DIAGNOSIS — E038 Other specified hypothyroidism: Secondary | ICD-10-CM

## 2023-09-28 DIAGNOSIS — E559 Vitamin D deficiency, unspecified: Secondary | ICD-10-CM

## 2023-09-28 DIAGNOSIS — I8393 Asymptomatic varicose veins of bilateral lower extremities: Secondary | ICD-10-CM

## 2023-09-28 LAB — COMPREHENSIVE METABOLIC PANEL
ALT: 24 U/L (ref 0–35)
AST: 17 U/L (ref 0–37)
Albumin: 4.2 g/dL (ref 3.5–5.2)
Alkaline Phosphatase: 67 U/L (ref 39–117)
BUN: 16 mg/dL (ref 6–23)
CO2: 29 meq/L (ref 19–32)
Calcium: 8.7 mg/dL (ref 8.4–10.5)
Chloride: 102 meq/L (ref 96–112)
Creatinine, Ser: 0.7 mg/dL (ref 0.40–1.20)
GFR: 85.63 mL/min (ref >60.00)
Glucose, Bld: 89 mg/dL (ref 70–99)
Potassium: 3.8 meq/L (ref 3.5–5.1)
Sodium: 139 meq/L (ref 135–145)
Total Bilirubin: 0.4 mg/dL (ref 0.2–1.2)
Total Protein: 7 g/dL (ref 6.0–8.3)

## 2023-09-28 LAB — VITAMIN D 25 HYDROXY (VIT D DEFICIENCY, FRACTURES): VITD: 21.49 ng/mL — ABNORMAL LOW (ref 30.00–100.00)

## 2023-09-28 LAB — TSH: TSH: 3.25 u[IU]/mL (ref 0.35–5.50)

## 2023-09-28 MED ORDER — LOSARTAN POTASSIUM 100 MG PO TABS
100.0000 mg | ORAL_TABLET | Freq: Every day | ORAL | 3 refills | Status: DC
Start: 2023-09-28 — End: 2024-03-29

## 2023-09-28 NOTE — Progress Notes (Signed)
SUBJECTIVE:   Chief Complaint  Patient presents with   Medical Management of Chronic Issues    6 month follow up   HPI Presents to clinic for follow up chronic disease management  Discussed the use of AI scribe software for clinical note transcription with the patient, who gave verbal consent to proceed.  History of Present Illness Joy Vaughan is a 73 year old female with hypertension who presents for a follow-up visit.  She recently visited her neurosurgeon, who decided against back surgery after reviewing her MRI. She received a cortisone injection, which has provided some relief, but she continues to experience back pain, particularly in the area where she believes the sciatic nerve is located. No pain radiates down her leg.  Her blood pressure is generally well-controlled at home. She takes her blood pressure medication without breakfast and currently uses a diuretic, which causes frequent urination and inconvenience, especially when she needs to be active. She has tried other blood pressure medications in the past, such as amlodipine, which caused ankle swelling. No chest pain or shortness of breath. Her blood pressure at home is reported to be stable.  She mentions a history of thyroid issues, with a previous thyroid test showing slightly elevated levels six months ago. She was not started on medication at that time. She previously took Synthroid, which caused her hair to thin and fall out, a known side effect of the medication.  She has concerns about her veins, noting that they have been bulging for a long time, particularly in her legs. She does not wear compression stockings and is curious about the cause of the bulging veins. She experiences swelling in her legs, which varies in severity, and she does not stay on her legs for extended periods due to stiffness.      PERTINENT PMH / PSH: As above  OBJECTIVE:  BP 120/66   Pulse 65   Temp 98.2 F (36.8 C)   Resp 18    Ht 5\' 5"  (1.651 m)   Wt 212 lb (96.2 kg)   SpO2 95%   BMI 35.28 kg/m    Physical Exam Vitals reviewed.  Constitutional:      General: She is not in acute distress.    Appearance: Normal appearance. She is obese. She is not ill-appearing, toxic-appearing or diaphoretic.  Eyes:     General:        Right eye: No discharge.        Left eye: No discharge.     Conjunctiva/sclera: Conjunctivae normal.  Cardiovascular:     Rate and Rhythm: Normal rate and regular rhythm.     Heart sounds: Normal heart sounds.  Pulmonary:     Effort: Pulmonary effort is normal.     Breath sounds: Normal breath sounds.  Abdominal:     General: Bowel sounds are normal.  Musculoskeletal:        General: Normal range of motion.  Skin:    General: Skin is warm and dry.  Neurological:     General: No focal deficit present.     Mental Status: She is alert and oriented to person, place, and time. Mental status is at baseline.  Psychiatric:        Mood and Affect: Mood normal.        Behavior: Behavior normal.        Thought Content: Thought content normal.        Judgment: Judgment normal.  09/28/2023    1:17 PM 05/13/2023   11:14 AM 03/28/2023    2:21 PM  Depression screen PHQ 2/9  Decreased Interest 0 0 0  Down, Depressed, Hopeless 0 0 0  PHQ - 2 Score 0 0 0  Altered sleeping 3 2 3   Tired, decreased energy 3 1 1   Change in appetite 1 0 1  Feeling bad or failure about yourself  0 0 0  Trouble concentrating 0 0 1  Moving slowly or fidgety/restless 0 0 0  Suicidal thoughts 0 0 0  PHQ-9 Score 7 3 6   Difficult doing work/chores Somewhat difficult Somewhat difficult Not difficult at all      09/28/2023    1:17 PM 03/28/2023    2:21 PM  GAD 7 : Generalized Anxiety Score  Nervous, Anxious, on Edge 0 0  Control/stop worrying 0 0  Worry too much - different things 0 0  Trouble relaxing 0 0  Restless 0 0  Easily annoyed or irritable 0 0  Afraid - awful might happen 0 0  Total GAD 7  Score 0 0  Anxiety Difficulty Not difficult at all Not difficult at all    ASSESSMENT/PLAN:  Primary hypertension Assessment & Plan: Blood pressure well controlled on Losartan-Hydrochlorothiazide. Patient experiencing urinary frequency due to diuretic component. -Discontinue Losartan-Hydrochlorothiazide. -Start Losartan 100mg  daily. -Advise patient to monitor blood pressure at home more frequently and contact office if readings consistently >150/90.  Orders: -     Comprehensive metabolic panel -     Losartan Potassium; Take 1 tablet (100 mg total) by mouth daily.  Dispense: 90 tablet; Refill: 3  Subclinical hypothyroidism Assessment & Plan: Previous slightly elevated thyroid levels. Patient experienced hair loss with prior Synthroid use. -Check thyroid levels today to monitor for changes.  Orders: -     TSH  Vitamin D deficiency Assessment & Plan: Check Vitamin D level  Orders: -     VITAMIN D 25 Hydroxy (Vit-D Deficiency, Fractures) -     Vitamin D (Ergocalciferol); Take 1 capsule (50,000 Units total) by mouth every 7 (seven) days.  Dispense: 12 capsule; Refill: 1  Mood disorder (HCC) Assessment & Plan: Chronic Continue Paxil 20 mg daily     Chronic low back pain without sciatica, unspecified back pain laterality Assessment & Plan: Recent MRI and cortisone shot administered by neurosurgeon. Some improvement noted but still experiencing pain. No sciatica symptoms. Possible future epidural injection. -Continue to monitor pain and follow up with neurosurgeon as needed.   Asymptomatic varicose veins of both lower extremities Assessment & Plan: Patient has visible varicose veins but no pain. Concerned about recent changes in appearance. -Advise patient to use compression stockings daily and remain active to promote venous return. -Consider referral to vascular specialist if symptoms worsen or become bothersome.      PDMP reviewed  Return in about 6 months (around  03/27/2024) for PCP, HTN.  Dana Allan, MD

## 2023-09-28 NOTE — Patient Instructions (Addendum)
It was a pleasure meeting you today. Thank you for allowing me to take part in your health care.  Our goals for today as we discussed include:  We will get some labs today.  If they are abnormal or we need to do something about them, I will call you.  If they are normal, I will send you a message on MyChart (if it is active) or a letter in the mail.  If you don't hear from [place] in 2 weeks, please call the office at the number below.   Stop Losartan-Hydrochlorothiazide  Start Cozaar 100 mg daily   If you have any questions or concerns, please do not hesitate to call the office at [phone].  I look forward to our next visit and until then take care and stay safe.  Regards,   Dana Allan, MD   [facility]

## 2023-09-29 ENCOUNTER — Encounter: Payer: Self-pay | Admitting: Family Medicine

## 2023-09-29 NOTE — Assessment & Plan Note (Signed)
Previous slightly elevated thyroid levels. Patient experienced hair loss with prior Synthroid use. -Check thyroid levels today to monitor for changes.

## 2023-09-29 NOTE — Assessment & Plan Note (Addendum)
Chronic Continue Paxil 20 mg daily

## 2023-09-29 NOTE — Assessment & Plan Note (Signed)
Blood pressure well controlled on Losartan-Hydrochlorothiazide. Patient experiencing urinary frequency due to diuretic component. -Discontinue Losartan-Hydrochlorothiazide. -Start Losartan 100mg  daily. -Advise patient to monitor blood pressure at home more frequently and contact office if readings consistently >150/90.

## 2023-10-03 ENCOUNTER — Encounter: Payer: Self-pay | Admitting: Family Medicine

## 2023-10-03 DIAGNOSIS — M545 Low back pain, unspecified: Secondary | ICD-10-CM | POA: Insufficient documentation

## 2023-10-03 DIAGNOSIS — I8393 Asymptomatic varicose veins of bilateral lower extremities: Secondary | ICD-10-CM | POA: Insufficient documentation

## 2023-10-03 MED ORDER — VITAMIN D (ERGOCALCIFEROL) 1.25 MG (50000 UNIT) PO CAPS: 50000.0000 [IU] | ORAL_CAPSULE | ORAL | 1 refills | Status: DC

## 2023-10-03 NOTE — Assessment & Plan Note (Signed)
Patient has visible varicose veins but no pain. Concerned about recent changes in appearance. -Advise patient to use compression stockings daily and remain active to promote venous return. -Consider referral to vascular specialist if symptoms worsen or become bothersome.

## 2023-10-03 NOTE — Assessment & Plan Note (Signed)
Recent MRI and cortisone shot administered by neurosurgeon. Some improvement noted but still experiencing pain. No sciatica symptoms. Possible future epidural injection. -Continue to monitor pain and follow up with neurosurgeon as needed.

## 2023-10-03 NOTE — Assessment & Plan Note (Signed)
 Check Vitamin D level

## 2023-11-11 ENCOUNTER — Telehealth: Payer: Self-pay | Admitting: Family Medicine

## 2023-11-11 NOTE — Telephone Encounter (Signed)
 Lm:  Dr Clent Ridges is leaving the practice and your appointment on 03/27/2024 needs to be rescheduled with another provider. Please call the office to reschedule your Transfer of Care to either Dr Charlann Lange, MD, Darleen Crocker or Kara Dies, NP.  [facility] please schedule

## 2023-12-05 ENCOUNTER — Telehealth: Payer: Self-pay

## 2023-12-05 NOTE — Telephone Encounter (Signed)
 Copied from CRM [id number]. Topic: General - Other >> Dec 05, 2023  3:24 PM [place] A wrote: Reason for CRM: Patient called because she was left a voicemail regarding Dr.Wwalsh leaving the practice.Agent informed patient the four providers that are accepting New Patients but patient said that she would like a MD and wants a call from the "main" office

## 2024-03-27 ENCOUNTER — Ambulatory Visit

## 2024-03-27 ENCOUNTER — Ambulatory Visit: Payer: Medicare Other | Admitting: Family Medicine

## 2024-03-29 ENCOUNTER — Other Ambulatory Visit: Payer: Self-pay

## 2024-03-29 ENCOUNTER — Ambulatory Visit: Payer: Self-pay

## 2024-03-29 ENCOUNTER — Ambulatory Visit (INDEPENDENT_AMBULATORY_CARE_PROVIDER_SITE_OTHER)

## 2024-03-29 ENCOUNTER — Telehealth: Payer: Self-pay

## 2024-03-29 VITALS — BP 130/64 | HR 78 | Temp 97.8°F | Ht 65.0 in | Wt 217.0 lb

## 2024-03-29 DIAGNOSIS — F39 Unspecified mood [affective] disorder: Secondary | ICD-10-CM

## 2024-03-29 DIAGNOSIS — Z131 Encounter for screening for diabetes mellitus: Secondary | ICD-10-CM | POA: Diagnosis not present

## 2024-03-29 DIAGNOSIS — K219 Gastro-esophageal reflux disease without esophagitis: Secondary | ICD-10-CM

## 2024-03-29 DIAGNOSIS — Z79899 Other long term (current) drug therapy: Secondary | ICD-10-CM | POA: Insufficient documentation

## 2024-03-29 DIAGNOSIS — E538 Deficiency of other specified B group vitamins: Secondary | ICD-10-CM

## 2024-03-29 DIAGNOSIS — I10 Essential (primary) hypertension: Secondary | ICD-10-CM | POA: Diagnosis not present

## 2024-03-29 DIAGNOSIS — K5720 Diverticulitis of large intestine with perforation and abscess without bleeding: Secondary | ICD-10-CM

## 2024-03-29 DIAGNOSIS — Z950 Presence of cardiac pacemaker: Secondary | ICD-10-CM

## 2024-03-29 DIAGNOSIS — F5101 Primary insomnia: Secondary | ICD-10-CM | POA: Insufficient documentation

## 2024-03-29 DIAGNOSIS — G4733 Obstructive sleep apnea (adult) (pediatric): Secondary | ICD-10-CM

## 2024-03-29 DIAGNOSIS — I495 Sick sinus syndrome: Secondary | ICD-10-CM

## 2024-03-29 DIAGNOSIS — Z95811 Presence of heart assist device: Secondary | ICD-10-CM | POA: Insufficient documentation

## 2024-03-29 DIAGNOSIS — E559 Vitamin D deficiency, unspecified: Secondary | ICD-10-CM | POA: Diagnosis not present

## 2024-03-29 DIAGNOSIS — I420 Dilated cardiomyopathy: Secondary | ICD-10-CM | POA: Diagnosis not present

## 2024-03-29 DIAGNOSIS — I5181 Takotsubo syndrome: Secondary | ICD-10-CM

## 2024-03-29 DIAGNOSIS — E669 Obesity, unspecified: Secondary | ICD-10-CM

## 2024-03-29 DIAGNOSIS — E038 Other specified hypothyroidism: Secondary | ICD-10-CM

## 2024-03-29 DIAGNOSIS — G8929 Other chronic pain: Secondary | ICD-10-CM

## 2024-03-29 DIAGNOSIS — E782 Mixed hyperlipidemia: Secondary | ICD-10-CM

## 2024-03-29 DIAGNOSIS — N3946 Mixed incontinence: Secondary | ICD-10-CM | POA: Insufficient documentation

## 2024-03-29 LAB — VITAMIN B12: Vitamin B-12: 199 pg/mL — ABNORMAL LOW (ref 211–911)

## 2024-03-29 LAB — HEMOGLOBIN A1C: Hgb A1c MFr Bld: 6.3 % (ref 4.6–6.5)

## 2024-03-29 LAB — VITAMIN D 25 HYDROXY (VIT D DEFICIENCY, FRACTURES): VITD: 30.55 ng/mL (ref 30.00–100.00)

## 2024-03-29 MED ORDER — LOSARTAN POTASSIUM-HCTZ 100-25 MG PO TABS: 1.0000 | ORAL_TABLET | Freq: Every day | ORAL | 1 refills | Status: DC

## 2024-03-29 MED ORDER — PAROXETINE HCL 20 MG PO TABS: 20.0000 mg | ORAL_TABLET | Freq: Every day | ORAL | 3 refills | Status: AC

## 2024-03-29 MED ORDER — OMEPRAZOLE 20 MG PO CPDR: 20.0000 mg | DELAYED_RELEASE_CAPSULE | Freq: Every day | ORAL | 3 refills | Status: AC

## 2024-03-29 NOTE — Telephone Encounter (Signed)
 Rosina called from [facility] to state patient states she wants to speak directly with someone in our office.  I asked Rosina to transfer the call to me.  Patient states she just recently saw Dr. Luke Shade and Dr. Shade asked her to call her back about the type of medication she is taking.  Patient states she is currently taking the following:  Losartan /HCTZ100/25 MG Paroxetine  20MG  Omeprazole  20MG  Vitamin D2 50,000IU (ERGO) Magnesium 600 MG (she thinks) Extra Strength Tylenol  - patients states she takes 2-3 in the morning and 2-3 at night (patient states she does not know how many mg per tablet)  Patient states would like for Dr. Shade to call in the prescription for the diet injection.  Patient states her preferred pharmacy is [facility] on the corner of [address] and [address] in [place].

## 2024-03-29 NOTE — Progress Notes (Signed)
 Please notify patient: Vitamin B12 is low. Untreated low B 12 can cause dementia, neuropathy. Recommend schedule RN appointment to start vitamin B12 shots: 1000 mcg shots IM once weekly for 4 weeks then monthly. Repeat B 12 in 1 month.   Vitamin D  is normalized. She can take over the counter vitamin D  2000 units daily. A1c is in prediabetic range. Recommend reduced consumption of carbohydrate and sugar rich food (pasta, bread, sweet drinks).   Thank you,  Luke Shade, MD

## 2024-03-29 NOTE — Assessment & Plan Note (Addendum)
 No palpitation, chest pain. Suspected secondary to stress. Established with [facility], continue f/u and management per cardiologistt Dr. Marsa Dooms. 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 Has a h/o hyperlipidemia, intolerant to statin, Zetia. Patient does not want trial of statin. She understands increased risk of stroke/MI and defers further conversation on statin initiation. Recommend incorporating mediterranean like diet, regular exercise as tolerated and safe given back pain.

## 2024-03-29 NOTE — Assessment & Plan Note (Signed)
 Symptoms stable on Omeprazole  20 mg which she takes in the morning, continue. Check B12 level

## 2024-03-29 NOTE — Assessment & Plan Note (Signed)
 No palpitation, chest pain. Suspected secondary to stress. Established with [facility], continue f/u and management per cardiologistt Dr. Marsa Dooms. 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 Has a h/o hyperlipidemia, intolerant to statin, Zetia. Patient does not want trial of statin. She understands increased risk of stroke/MI and defers further conversation on statin initiation. Recommend incorporating mediterranean like diet, regular exercise as tolerated and safe given back pain.

## 2024-03-29 NOTE — Progress Notes (Signed)
 Established Patient Office Visit TOC from Dr. Hope    Subjective  Patient ID: Joy Vaughan, female    DOB: 08/24/1950  Age: 73 y.o. MRN: [id number]  Chief Complaint  Patient presents with   Establish Care    She  has a past medical history of Acquired hypothyroidism (04/28/2022), Colonic diverticular abscess (03/20/2023), Diverticulitis (09/26/2018), Diverticulitis of colon (08/16/2009), Diverticulosis, GERD (gastroesophageal reflux disease), Hypertension, Iron deficiency anemia (04/10/2023), and Sleep apnea.  HPI Discussed the use of AI scribe software for clinical note transcription with the patient, who gave verbal consent to proceed.  History of Present Illness Joy Vaughan is a 73 year old female presents to establish care.   OSA, insomnia: She has a history of sleep apnea but does not use a CPAP machine due to discomfort and mask fitting issues. A sleep study conducted years ago indicated snoring and brief episodes of apnea, although her husband has not observed snoring. She experiences poor sleep quality, often staying awake all night every other night, and feels tired during the day. She is not interested to see a sleep specialist.   Hypertension: is managed with losartan  and hydrochlorothiazide , taken every night. However last refill from PCP Dr. Cottie note states hydrochlorothiazide  was discontinued due to concern for worsening urinary symptoms. She sees her cardiologist every three to six months at [facility] and reports that she does not receive blood pressure medication from her.  She has acid reflux and takes Omeprazole  20 mg daily.   She has a history of depression, with a previous overdose incident about two years ago. Patient expressed frustration when asked about this aspect of her history. She declines feeling depressed. No current thoughts of self-harm and she reports feeling fine mood-wise. Her depression screening score has increased from seven in February to  fourteen. She states she is not depressed and attributes her symptoms to sleep issues.   Chronic back pain is persistent and localized without radiation down her leg. She has had multiple joint surgeries, including hip, knee replacements, and uses a walker for mobility. She has tried various therapies and medications for pain management, including Tylenol , which she takes regularly but finds ineffective.   She reports urinary incontinence for years, requiring the use of pads, and has tried medications in the past but discontinued them due to side effects. She has not found Kegel exercises helpful.  She has a history of melanoma, which was treated with removal, and she is currently cancer-free. She sees a dermatologist annually for follow-up.  She has a history of high cholesterol but cannot tolerate statins due to muscle pain. She has not been on cholesterol medication for many years and manages her condition through diet.  ROS As per HPI    Objective:     BP 130/64 (BP Location: Right Arm, Patient Position: Sitting, Cuff Size: Normal)   Pulse 78   Temp 97.8 F (36.6 C) (Oral)   Ht 5' 5 (1.651 m)   Wt 217 lb (98.4 kg)   SpO2 93%   BMI 36.11 kg/m      03/29/2024   11:10 AM 09/28/2023    1:17 PM 05/13/2023   11:14 AM  Depression screen PHQ 2/9  Decreased Interest 2 0 0  Down, Depressed, Hopeless 0 0 0  PHQ - 2 Score 2 0 0  Altered sleeping 3 3 2   Tired, decreased energy 2 3 1   Change in appetite 3 1 0  Feeling bad or failure about yourself  0 0 0  Trouble concentrating 1 0 0  Moving slowly or fidgety/restless 3 0 0  Suicidal thoughts 0 0 0  PHQ-9 Score 14 7 3   Difficult doing work/chores Somewhat difficult Somewhat difficult Somewhat difficult      03/29/2024   11:10 AM 09/28/2023    1:17 PM 03/28/2023    2:21 PM  GAD 7 : Generalized Anxiety Score  Nervous, Anxious, on Edge 0 0 0  Control/stop worrying 0 0 0  Worry too much - different things 0 0 0  Trouble relaxing 0 0  0  Restless 0 0 0  Easily annoyed or irritable 0 0 0  Afraid - awful might happen 0 0 0  Total GAD 7 Score 0 0 0  Anxiety Difficulty Not difficult at all Not difficult at all Not difficult at all      03/29/2024   11:10 AM 09/28/2023    1:17 PM 05/13/2023   11:14 AM  Depression screen PHQ 2/9  Decreased Interest 2 0 0  Down, Depressed, Hopeless 0 0 0  PHQ - 2 Score 2 0 0  Altered sleeping 3 3 2   Tired, decreased energy 2 3 1   Change in appetite 3 1 0  Feeling bad or failure about yourself  0 0 0  Trouble concentrating 1 0 0  Moving slowly or fidgety/restless 3 0 0  Suicidal thoughts 0 0 0  PHQ-9 Score 14 7 3   Difficult doing work/chores Somewhat difficult Somewhat difficult Somewhat difficult      03/29/2024   11:10 AM 09/28/2023    1:17 PM 03/28/2023    2:21 PM  GAD 7 : Generalized Anxiety Score  Nervous, Anxious, on Edge 0 0 0  Control/stop worrying 0 0 0  Worry too much - different things 0 0 0  Trouble relaxing 0 0 0  Restless 0 0 0  Easily annoyed or irritable 0 0 0  Afraid - awful might happen 0 0 0  Total GAD 7 Score 0 0 0  Anxiety Difficulty Not difficult at all Not difficult at all Not difficult at all   SDOH Screenings   Food Insecurity: No Food Insecurity (05/13/2023)  Housing: Unknown (01/11/2024)   Received from [facility]  Transportation Needs: No Transportation Needs (05/13/2023)  Utilities: Not At Risk (05/13/2023)  Alcohol Screen: Low Risk  (05/13/2023)  Depression (PHQ2-9): High Risk (03/29/2024)  Financial Resource Strain: Low Risk  (05/13/2023)  Physical Activity: Inactive (05/13/2023)  Social Connections: Socially Integrated (05/13/2023)  Stress: Stress Concern Present (05/13/2023)  Tobacco Use: Medium Risk (03/29/2024)  Health Literacy: Adequate Health Literacy (05/13/2023)     Physical Exam Constitutional:      General: She is not in acute distress.    Appearance: She is obese.  HENT:     Head: Normocephalic and atraumatic.      Right Ear: Tympanic membrane normal.     Left Ear: Tympanic membrane normal.  Cardiovascular:     Rate and Rhythm: Normal rate.  Pulmonary:     Breath sounds: No wheezing.  Abdominal:     General: Abdomen is protuberant.     Palpations: Abdomen is soft.     Tenderness: There is no abdominal tenderness.  Musculoskeletal:     Cervical back: Neck supple. No tenderness.     Right lower leg: No edema.     Left lower leg: No edema.  Neurological:     Mental Status: She is alert and oriented to person, place,  and time.  Psychiatric:        Mood and Affect: Mood normal.        No results found for any visits on 03/29/24.  The ASCVD Risk score (Arnett DK, et al., 2019) failed to calculate for the following reasons:   Cannot find a previous HDL lab   Cannot find a previous total cholesterol lab     Assessment & Plan:   Primary hypertension Assessment & Plan: Patient reports she is supposed to be on Losartan -Hydrochlorothiazide  for BP control. However, Dr. Cottie note from last office visit states, start Losartan  100 mg after discontinuation of hydrochlorothiazide  due to suspected urinary frequency as a s/e of Hydrochlorothiazide . Patient believes she has been taking Losartan -hydrochlorothiazide  100-25 mg daily. She will check her home medication and reach out to us  for refill. She is established with [facility] and f/u with them every 4 months but she does not get medication prescribed through cardiology clinic.     Encounter for screening examination for intermediate hyperglycemia and diabetes mellitus Assessment & Plan: On chronic omeprazole , check B12, folic acid level.   Orders: -     Hemoglobin A1c  Vitamin D  deficiency Assessment & Plan: Check vitamin D  level   Orders: -     VITAMIN D  25 Hydroxy (Vit-D Deficiency, Fractures)  Medication management Assessment & Plan: On chronic omeprazole , check B12, folic acid level.   Orders: -     Vitamin B12  Dilated  cardiomyopathy (HCC) Assessment & Plan: No palpitation, chest pain. Suspected secondary to stress. Established with [facility], continue f/u and management per cardiologistt Dr. Marsa Dooms. 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 Has a h/o hyperlipidemia, intolerant to statin, Zetia. Patient does not want trial of statin. She understands increased risk of stroke/MI and defers further conversation on statin initiation. Recommend incorporating mediterranean like diet, regular exercise as tolerated and safe given back pain.    Gastroesophageal reflux disease, unspecified whether esophagitis present Assessment & Plan: Symptoms stable on Omeprazole  20 mg which she takes in the morning, continue. Check B12 level   Hyperlipidemia, mixed Assessment & Plan: No palpitation, chest pain. Suspected secondary to stress. Established with [facility], continue f/u and management per cardiologistt Dr. Marsa Dooms. 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 Has a h/o hyperlipidemia, intolerant to statin, Zetia. Patient does not want trial of statin. She understands increased risk of stroke/MI and defers further conversation on statin initiation. Recommend incorporating mediterranean like diet, regular exercise as tolerated and safe given back pain. Deferred against checking for lipid panel on patient's request.    Chronic low back pain without sciatica, unspecified back pain laterality Assessment & Plan: Chronic, established with neurosurgery in [place], continue f/u with neurosurgery team.    Mood disorder [facility]) Assessment & Plan: Chronic. Continue Paxil  20 mg daily.  Has a h/o medication overdose in the past but patient does not want to discuss further on this. No SI/HI. Given her age I recommend changing therapy to age appropriate SSRI/SNRI eg- Escitalopram. Patient would like to continue current medication for now, will discuss this again in the future.       Obesity (BMI 30-39.9) Assessment & Plan: Discussed lifestyle modifications, pharmacological intervention with GLP-1 group medication. No h/o thyroid  cancer, MEN, pancreatitis. Patient deferred GLP-1 initiation at this time due to cost concern.    Presence of heart assist device [facility]) Assessment & Plan: No palpitation, chest pain. Suspected secondary to stress. Established with [facility], continue f/u and management  per cardiologistt Dr. Marsa Dooms. 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 Has a h/o hyperlipidemia, intolerant to statin, Zetia. Patient does not want trial of statin. She understands increased risk of stroke/MI and defers further conversation on statin initiation. Recommend incorporating mediterranean like diet, regular exercise as tolerated and safe given back pain.    Obstructive sleep apnea syndrome Assessment & Plan: Not on CPAP therapy. Did not like using CPAP mask. Discussed risk of atrial fibrillation, uncontrolled hypertension among other risks related to untreated OSA. Patient verbalizes understanding and does not want to further discuss on OSA, sleep specialist referral at this time.    Sick sinus syndrome [facility]) Assessment & Plan: No palpitation, chest pain. Suspected secondary to stress. Established with [facility], continue f/u and management per cardiologistt Dr. Marsa Dooms. 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 Has a h/o hyperlipidemia, intolerant to statin, Zetia. Patient does not want trial of statin. She understands increased risk of stroke/MI and defers further conversation on statin initiation. Recommend incorporating mediterranean like diet, regular exercise as tolerated and safe given back pain.    S/P cardiac pacemaker procedure Assessment & Plan: No palpitation, chest pain. Suspected secondary to stress. Established with [facility], continue f/u and management per cardiologistt Dr. Marsa Dooms. 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 Has a h/o hyperlipidemia, intolerant to statin, Zetia. Patient does not want trial of statin. She understands increased risk of stroke/MI and defers further conversation on statin initiation. Recommend incorporating mediterranean like diet, regular exercise as tolerated and safe given back pain.    Takotsubo cardiomyopathy Assessment & Plan: No palpitation, chest pain. Suspected secondary to stress. Established with [facility], continue f/u and management per cardiologistt Dr. Marsa Dooms. 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 Has a h/o hyperlipidemia, intolerant to statin, Zetia. Patient does not want trial of statin. She understands increased risk of stroke/MI and defers further conversation on statin initiation. Recommend incorporating mediterranean like diet, regular exercise as tolerated and safe given back pain.    Mixed stress and urge urinary incontinence Assessment & Plan: Reports of previous urology evaluation, Kegel exercise. Wears pads, no worsening symptoms. Discussed gynecology referral, pelvic floor therapy and patient will let us  know if she changes her mind/if symptoms worsens.    Primary insomnia Assessment & Plan: Known h/o OSA. Not on CPAP therapy. Did not like using CPAP mask. Discussed risk of atrial fibrillation, uncontrolled hypertension among other risks related to untreated OSA. Patient verbalizes understanding and does not want to further discuss on OSA, sleep specialist referral at this time.    Other orders -     Omeprazole ; Take 1 capsule (20 mg total) by mouth daily.  Dispense: 90 capsule; Refill: 3 -     PARoxetine  HCl; Take 1 tablet (20 mg total) by mouth daily.  Dispense: 90 tablet; Refill: 3  I personally spent a total of 50 minutes in the care of the patient today including preparing to see the patient, getting/reviewing separately obtained history, performing a medically  appropriate exam/evaluation, counseling and educating, placing orders, documenting clinical information in the EHR, independently interpreting results, and communicating results.   Return in about 6 months (around 09/29/2024) for Chronic follow up .   Luke Shade, MD

## 2024-03-29 NOTE — Assessment & Plan Note (Addendum)
 Patient reports she is supposed to be on Losartan -Hydrochlorothiazide  for BP control. However, Dr. Cottie note from last office visit states, start Losartan  100 mg after discontinuation of hydrochlorothiazide  due to suspected urinary frequency as a s/e of Hydrochlorothiazide . Patient believes she has been taking Losartan -hydrochlorothiazide  100-25 mg daily. She will check her home medication and reach out to us  for refill. She is established with [facility] and f/u with them every 4 months but she does not get medication prescribed through cardiology clinic.

## 2024-03-29 NOTE — Assessment & Plan Note (Signed)
 Chronic, established with neurosurgery in [place], continue f/u with neurosurgery team.

## 2024-03-29 NOTE — Assessment & Plan Note (Addendum)
 No palpitation, chest pain. Suspected secondary to stress. Established with [facility], continue f/u and management per cardiologistt Dr. Marsa Dooms. 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 Has a h/o hyperlipidemia, intolerant to statin, Zetia. Patient does not want trial of statin. She understands increased risk of stroke/MI and defers further conversation on statin initiation. Recommend incorporating mediterranean like diet, regular exercise as tolerated and safe given back pain. Deferred against checking for lipid panel on patient's request.

## 2024-03-29 NOTE — Assessment & Plan Note (Signed)
 Check vitamin D  level

## 2024-03-29 NOTE — Patient Instructions (Addendum)
-   If you are interested in the shingles vaccine series (Shingrix), call your insurance or pharmacy to check on coverage and location it must be given.  If affordable - you can schedule it here or at your pharmacy depending on coverage.   - Please let me know when you get home which blood pressure medicine you are taking. I will send you refill once I have the name, dose of the medication.    - Please reach out to your back doctor to discuss potential epidural injection.

## 2024-03-29 NOTE — Telephone Encounter (Unsigned)
 Copied from CRM #[id number]. Topic: Clinical - Medication Question >> Mar 29, 2024  2:00 PM Burnard DEL wrote: Reason for CRM: Patient called to report  that on :magnesium was 250 mg Tylenol  500 mg.Patient was told to call and give this information to provider.

## 2024-03-29 NOTE — Assessment & Plan Note (Signed)
 On chronic omeprazole , check B12, folic acid level.

## 2024-03-29 NOTE — Assessment & Plan Note (Signed)
 Chronic. Continue Paxil  20 mg daily.  Has a h/o medication overdose in the past but patient does not want to discuss further on this. No SI/HI. Given her age I recommend changing therapy to age appropriate SSRI/SNRI eg- Escitalopram. Patient would like to continue current medication for now, will discuss this again in the future.

## 2024-03-29 NOTE — Assessment & Plan Note (Signed)
 Not on CPAP therapy. Did not like using CPAP mask. Discussed risk of atrial fibrillation, uncontrolled hypertension among other risks related to untreated OSA. Patient verbalizes understanding and does not want to further discuss on OSA, sleep specialist referral at this time.

## 2024-03-29 NOTE — Assessment & Plan Note (Signed)
 Reports of previous urology evaluation, Kegel exercise. Wears pads, no worsening symptoms. Discussed gynecology referral, pelvic floor therapy and patient will let us  know if she changes her mind/if symptoms worsens.

## 2024-03-29 NOTE — Assessment & Plan Note (Addendum)
 Discussed lifestyle modifications, pharmacological intervention with GLP-1 group medication. No h/o thyroid  cancer, MEN, pancreatitis. Patient deferred GLP-1 initiation at this time due to cost concern.

## 2024-03-29 NOTE — Assessment & Plan Note (Signed)
 Known h/o OSA. Not on CPAP therapy. Did not like using CPAP mask. Discussed risk of atrial fibrillation, uncontrolled hypertension among other risks related to untreated OSA. Patient verbalizes understanding and does not want to further discuss on OSA, sleep specialist referral at this time.

## 2024-03-29 NOTE — Progress Notes (Signed)
 1. Primary hypertension (Primary) - Patient sent confirmation message that she is on Hyzaar 100-25 mg daily, refill sent.  - losartan -hydrochlorothiazide  (HYZAAR) 100-25 MG tablet; Take 1 tablet by mouth daily.  Dispense: 90 tablet; Refill: 1

## 2024-03-30 NOTE — Progress Notes (Signed)
 We discussed about this briefly during her visit with me on 04/08/24. If she is interested in starting injectable weight loss medication as discussed during office visit I would recommend seeing her in clinic in about 1-2 months. She was concerned about cost of the medication.   Thank you,  Luke Shade, MD

## 2024-04-02 ENCOUNTER — Telehealth: Payer: Self-pay

## 2024-04-02 DIAGNOSIS — E661 Drug-induced obesity: Secondary | ICD-10-CM | POA: Insufficient documentation

## 2024-04-02 DIAGNOSIS — E66812 Obesity, class 2: Secondary | ICD-10-CM | POA: Insufficient documentation

## 2024-04-02 DIAGNOSIS — Z6836 Body mass index (BMI) 36.0-36.9, adult: Secondary | ICD-10-CM

## 2024-04-02 MED ORDER — TIRZEPATIDE-WEIGHT MANAGEMENT 2.5 MG/0.5ML ~~LOC~~ SOLN: 2.5000 mg | SUBCUTANEOUS | 1 refills | Status: DC

## 2024-04-02 NOTE — Telephone Encounter (Signed)
 Pt notified via MyChart.

## 2024-04-02 NOTE — Telephone Encounter (Signed)
 Called patient to make her aware of Dr Graylon recommendations. Patient says she does recall telling the provider that she didn't think she could afford $400 a month for the medication, but she has yet sat down with her husband. They discussed it and he said they can now afford the $400 and would like for Dr Abbey to send the prescription. Please advise?

## 2024-04-02 NOTE — Telephone Encounter (Signed)
 Pt stated she came into the office on 03/29/2024 and dicussed about weight loss medication pt also stated that she would like to get put on a medication  ( Injection)  that was mention during the office visit. Pt could not tell me the name of the medication. Please advise.

## 2024-04-02 NOTE — Telephone Encounter (Signed)
 I recommend lifestyle intervention for 2 months before I start patient on weight loss medication.  Lifestyle modifications includes eating healthy, regular moderate intensity exercise as tolerated by the patient.  Recommend follow-up in 2 months for weight check and discussion for initiation of weight loss medication.   Luke Shade, MD

## 2024-04-02 NOTE — Telephone Encounter (Signed)
 1. Class 2 obesity without serious comorbidity with body mass index (BMI) of 36.0 to 36.9 in adult, unspecified obesity type (Primary) - tirzepatide  (ZEPBOUND ) 2.5 MG/0.5ML injection vial; Inject 2.5 mg into the skin once a week.  Dispense: 2 mL; Refill: 1  Recommend either nurse visit or discuss how to administer subcutaneous injection of Zepbound  with pharmacy when she goes to pick up the prescription. If patient has any questions/concerns I recommend an office visit before her appointment with me in October. I do not recommend back and forth conversation in mychart specially when we are starting new medication.   Thank you,  Luke Shade, MD

## 2024-04-02 NOTE — Telephone Encounter (Signed)
 Looks lie GLP-1 was discussed during visit.

## 2024-04-03 NOTE — Telephone Encounter (Signed)
 Request has been sent to RX Prior Authorization team to handle request.

## 2024-04-04 ENCOUNTER — Other Ambulatory Visit (HOSPITAL_COMMUNITY): Payer: Self-pay

## 2024-04-04 ENCOUNTER — Telehealth: Payer: Self-pay

## 2024-04-04 NOTE — Telephone Encounter (Signed)
 Pharmacy Patient Advocate Encounter   Received notification from Pt Calls Messages that prior authorization for ZEPBOUND  is required/requested.   Insurance verification completed.   The patient is insured through [facility] .   Per test claim: PLAN EXLUSION. NOT COVERED UNDER PART D LAW

## 2024-04-04 NOTE — Telephone Encounter (Signed)
 Per test claim: PLAN EXLUSION. NOT COVERED UNDER PART D LAW

## 2024-04-05 ENCOUNTER — Other Ambulatory Visit (HOSPITAL_COMMUNITY): Payer: Self-pay

## 2024-04-05 ENCOUNTER — Ambulatory Visit

## 2024-04-05 DIAGNOSIS — E538 Deficiency of other specified B group vitamins: Secondary | ICD-10-CM | POA: Diagnosis not present

## 2024-04-05 MED ORDER — CYANOCOBALAMIN 1000 MCG/ML IJ SOLN
1000.0000 ug | Freq: Once | INTRAMUSCULAR | Status: AC
Start: 2024-04-05 — End: 2024-04-05
  Administered 2024-04-05: 1000 ug via INTRAMUSCULAR

## 2024-04-05 NOTE — Telephone Encounter (Signed)
 Please run PA through [facility]

## 2024-04-05 NOTE — Progress Notes (Signed)
 Patient presented for B 12 injection to left deltoid, patient voiced no concerns nor showed any signs of distress during injection.

## 2024-04-06 ENCOUNTER — Other Ambulatory Visit (HOSPITAL_COMMUNITY): Payer: Self-pay

## 2024-04-06 NOTE — Telephone Encounter (Signed)
   This is the only insurance card in patient's chart. This is also the only insurance that pulled up in the eligibility search. This insurance is a Medicare prescription drug plan. Please advise if patient has another insurance.

## 2024-04-13 ENCOUNTER — Ambulatory Visit (INDEPENDENT_AMBULATORY_CARE_PROVIDER_SITE_OTHER)

## 2024-04-13 DIAGNOSIS — E538 Deficiency of other specified B group vitamins: Secondary | ICD-10-CM | POA: Diagnosis not present

## 2024-04-13 MED ORDER — CYANOCOBALAMIN 1000 MCG/ML IJ SOLN
1000.0000 ug | Freq: Once | INTRAMUSCULAR | Status: AC
  Administered 2024-04-13: 1000 ug via INTRAMUSCULAR

## 2024-04-13 NOTE — Progress Notes (Signed)
 Patient presented for B 12 injection to left deltoid, patient voiced no concerns nor showed any signs of distress during injection.

## 2024-04-19 ENCOUNTER — Ambulatory Visit

## 2024-04-19 DIAGNOSIS — E538 Deficiency of other specified B group vitamins: Secondary | ICD-10-CM | POA: Diagnosis not present

## 2024-04-19 MED ORDER — CYANOCOBALAMIN 1000 MCG/ML IJ SOLN
1000.0000 ug | Freq: Once | INTRAMUSCULAR | Status: AC
  Administered 2024-04-19: 1000 ug via INTRAMUSCULAR

## 2024-04-19 NOTE — Progress Notes (Signed)
Pt received B12 injection in Left deltoid. Pt tolerated it well with no complaints or concerns.

## 2024-04-26 ENCOUNTER — Ambulatory Visit: Payer: Self-pay

## 2024-04-26 ENCOUNTER — Ambulatory Visit

## 2024-04-26 NOTE — Telephone Encounter (Signed)
 If patient prefers to get antiviral treatment she should be seen for virtual visit in primary care of urgent care.   Thank you,  Luke Shade, MD

## 2024-04-26 NOTE — Telephone Encounter (Signed)
 Patient called back and had a positive home test for COVID. Patient doesn't want to be seen in the office. Asking for medications to be sent into the office. Asking for a follow up call.

## 2024-04-26 NOTE — Telephone Encounter (Signed)
 FYI Only or Action Required?: Action required by provider: medication refill request. Pt would like a zpak.   Patient was last seen in primary care on 03/29/2024 by Abbey Bruckner, MD.  Called Nurse Triage reporting Cough.  Symptoms began yesterday.  Interventions attempted: Nothing.  Symptoms are: unchanged.  Triage Disposition: See Physician Within 24 Hours  Patient/caregiver understands and will follow disposition?: No, wishes to speak with PCP  Message from [facility] sent at 04/26/2024  9:25 AM EDT  Not feeling well.. body aches.. yesterday chills.. right ear infection.SABRA stopped up   Reason for Disposition  [place]  Answer Assessment - Initial Assessment Questions 1. ONSET: When did the cough begin?      yesterday 2. SEVERITY: How bad is the cough today?      Mild-moderate 3. SPUTUM: Describe the color of your sputum (e.g., none, dry cough; clear, white, yellow, green)     unsure 4. HEMOPTYSIS: Are you coughing up any blood? If Yes, ask: How much? (e.g., flecks, streaks, tablespoons, etc.)     denies 5. DIFFICULTY BREATHING: Are you having difficulty breathing? If Yes, ask: How bad is it? (e.g., mild, moderate, severe)      denies 6. FEVER: Do you have a fever? If Yes, ask: What is your temperature, how was it measured, and when did it start?     99.7 with tylenol  7. CARDIAC HISTORY: Do you have any history of heart disease? (e.g., heart attack, congestive heart failure)      Pacemaker,  8. LUNG HISTORY: Do you have any history of lung disease?  (e.g., pulmonary embolus, asthma, emphysema)     denies 9. PE RISK FACTORS: Do you have a history of blood clots? (or: recent major surgery, recent prolonged travel, bedridden)     denies 10. OTHER SYMPTOMS: Do you have any other symptoms? (e.g., runny nose, wheezing, chest pain)       Fever and chills yesterday 12. TRAVEL: Have you traveled out of the country in the last month? (e.g., travel history,  exposures)       Denies  Pt states she would like a zpak or something to help me with this.  Pt denies hx of sinus infections. Pt states that she would also like her b12 shot canceled for today. Pt was advised to utilize UC to be seen today, pt refused. No VV or in person visits avail until tomorrow, pt refuses to go to another clinic tomorrow to be seen. Pt advised to do at home covid test and call clinic back if positive.  Protocols used: Cough - Acute Productive-A-AH

## 2024-04-27 NOTE — Telephone Encounter (Signed)
 Left message for patient to give our office a call back to discuss Dr Graylon recommendations.   OK for E2C2 to relay the message if pt calls back. If relayed, please notify the office.

## 2024-05-02 NOTE — Telephone Encounter (Signed)
 Spoke with patient to follow up with how she is feeling. Patient states she is feeling a little better, states she is taking Tylenol  and Delsym. Patient states she was not successful with getting setting up to be seen virtually and she just felt bad and gave up. Patient was advised if she needs to be seen before her next scheduled appointment to let our office know. Patient verbalized understanding.

## 2024-05-10 ENCOUNTER — Encounter: Payer: Self-pay | Admitting: Ophthalmology

## 2024-05-11 ENCOUNTER — Encounter: Payer: Self-pay | Admitting: Ophthalmology

## 2024-05-11 ENCOUNTER — Ambulatory Visit

## 2024-05-11 DIAGNOSIS — E538 Deficiency of other specified B group vitamins: Secondary | ICD-10-CM

## 2024-05-11 MED ORDER — CYANOCOBALAMIN 1000 MCG/ML IJ SOLN
1000.0000 ug | Freq: Once | INTRAMUSCULAR | Status: AC
  Administered 2024-05-11: 1000 ug via INTRAMUSCULAR

## 2024-05-11 NOTE — Progress Notes (Cosign Needed Addendum)
 Pt received B12 injection in Left  deltoid muscle. Pt tolerated it well with no complaints or concerns.

## 2024-05-14 NOTE — Discharge Instructions (Signed)

## 2024-05-15 ENCOUNTER — Telehealth: Payer: Self-pay

## 2024-05-15 ENCOUNTER — Encounter: Payer: Self-pay | Admitting: Ophthalmology

## 2024-05-15 NOTE — Telephone Encounter (Signed)
 Patient has finished taking all 4 B12 injections. She would like to know if the needs to have monthly injections.

## 2024-05-15 NOTE — Anesthesia Preprocedure Evaluation (Addendum)
 Anesthesia Evaluation  Patient identified by MRN, date of birth, ID band Patient awake    Reviewed: Allergy & Precautions, H&P , NPO status , Patient's Chart, lab work & pertinent test results  Airway Mallampati: II  TM Distance: >3 FB Neck ROM: Full    Dental no notable dental hx. (+) Implants, Caps Top teeth central and lateral incisors are implants with a snap on dental piece, and lower teeth are capped.:   Pulmonary neg pulmonary ROS, sleep apnea , former smoker   Pulmonary exam normal breath sounds clear to auscultation       Cardiovascular hypertension, + DOE  negative cardio ROS Normal cardiovascular exam+ pacemaker  Rhythm:Regular Rate:Normal  03-07-24 cardiologist's note: 7. Dual-chamber pacemaker implant 03/11/2022  On 10/11/2021, patient was in [place], [place]  at which time she overdosed on sleeping pills at a rest stop, EMS was called and the patient was admitted to inpatient psychiatry. The patient was also treated for aspiration pneumonia. ECG was reportedly abnormal and troponin elevated to 2000. 2D echocardiogram was performed which revealed LVEF of 35%. Patient was seen by cardiology, and underwent cardiac catheterization on 10/16/2021 at [facility] which did not reveal evidence for significant coronary artery disease and the patient was diagnosed with stress-induced cardiomyopathy (Takotsubo's). The patient was discharged home on metoprolol  succinate, losartan , furosemide and Aldactone.   At a previous office visit, the patient complained of symptomatic bradycardia with heart rates in the 40s and 50s at rest. She also reported exertional dyspnea with minimal exertion, such as walking through her house. The patient called about her low heart rate and metoprolol  succinate was decreased to 12.5 mg daily.   ECG 10/21/2021 revealed sinus rhythm at 71 bpm with evidence of old septal MI. 7-day Holter  monitor 12/28/2021 - 01/03/2022 revealed predominant sinus bradycardia with mean heart rate of 57 bpm, heart rate range 40 to 88 bpm, occasional premature atrial contractions, and occasional atrial runs the longest lasting 10 beats. 2D echocardiogram on 02/08/2022 revealed normal left ventricular function with LVEF 50% with mild LVH with mild tricuspid regurgitation, and trivial pericardial effusion.  Pacemaker interrogation revealed normal functioning dual-chamber pacemaker with a longevity of 13.4 years with episodes of atrial tachycardia/flutter in August to December with no recurrent events since then. There was one 7 beat NSVT in November, and 1 12-beat NSVT versus SVT in December.   7-day Holter monitor revealed predominant sinus rhythm with mean heart rate of 71 bpm, sinus heart rate range 58 to 119 bpm. Atrial pacing with ventricular sensing was observed. Occasional premature atrial contractions and premature ventricular contractions were present. Occasional atrial runs were observed the longest lasting 18 beats. There was no correlation with diary entry.   Patient returns for follow-up, reports doing well. She denies chest pain. She does have chronic exertional dyspnea, which is unchanged. She denies palpitations or heart racing. She has chronic lower extremity edema, which is stable on HCTZ. She denies presyncope or syncope.   The patient is not very active and does not exercise regularly due to chronic right knee pain.   Pacemaker interrogation 01/11/2024 revealed normally functioning dual-chamber pacemaker, with underlying sinus rhythm, 4 atrial high rate episodes, longest lasting greater than 2 hours, longevity of 11.9 years. 14-day Holter monitor 01/25/2024 - 02/08/2024 revealed predominant atrial pacing with ventricular sensing at 69 bpm, occasional premature ventricular contractions and premature atrial contractions, occasional brief runs of SVT, longest lasting 14 beats, no prolonged episodes  of atrial fibrillation.  The  patient has essential hypertension, blood pressure well controlled today on losartan /HCTZ, which is well-tolerated without apparent side effects. She states that her blood pressure has been as high as 180s-190s/80s recently. She has been off of metoprolol  since prior to her pacemaker.  02-08-22 echo DOPPLER ECHO and OTHER SPECIAL PROCEDURES                 Aortic: No AR                      No AS                         119.5 cm/sec peak vel      5.7 mmHg peak grad                 Mitral: TRIVIAL MR                 No MS                         MV Inflow E Vel = 105.0 cm/sec      MV Annulus E'Vel = 4.8 cm/sec                         E/E'Ratio = 21.9              Tricuspid: MILD TR                    No TS                         253.1 cm/sec peak TR vel   30.6 mmHg peak RV pressure              Pulmonary: TRIVIAL PR                 No PS  _________________________________________________________________________________________  INTERPRETATION  NORMAL LEFT VENTRICULAR SYSTOLIC FUNCTION  NORMAL RIGHT VENTRICULAR SYSTOLIC FUNCTION  MILD VALVULAR REGURGITATION (See above)  NO VALVULAR STENOSIS  TRIVIAL PERICARDIAL EFFUSION     Neuro/Psych  PSYCHIATRIC DISORDERS  Depression    negative neurological ROS  negative psych ROS   GI/Hepatic negative GI ROS, Neg liver ROS,GERD  ,,  Endo/Other  negative endocrine ROSHypothyroidism    Renal/GU negative Renal ROS  negative genitourinary   Musculoskeletal negative musculoskeletal ROS (+)    Abdominal   Peds negative pediatric ROS (+)  Hematology negative hematology ROS (+) Blood dyscrasia, anemia   Anesthesia Other Findings GERD (gastroesophageal reflux disease) Hypertension Sleep apnea Diverticulosis Acquired hypothyroidism Colonic diverticular abscess Diverticulitis of colon Diverticulitis Iron deficiency anemia Presence of permanent cardiac pacemaker Hyperlipemia Dilated cardiomyopathy (HCC) Sinus  bradycardia SVT (supraventricular tachycardia) Stress-induced cardiomyopathy Major depression Deafness in left ear Atrial flutter (HCC) Atrial tachycardia Symptomatic bradycardia DOE (dyspnea on exertion)  PACs PVC's (premature ventricular contractions) Atrial septal defect and mitral stenosis Mild tricuspid regurg   Old septal MI  Reproductive/Obstetrics negative OB ROS                              Anesthesia Physical Anesthesia Plan  ASA: 3  Anesthesia Plan: MAC   Post-op Pain Management:    Induction: Intravenous  PONV Risk Score and Plan:   Airway Management Planned: Natural Airway and Nasal Cannula  Additional Equipment:  Intra-op Plan:   Post-operative Plan:   Informed Consent: I have reviewed the patients History and Physical, chart, labs and discussed the procedure including the risks, benefits and alternatives for the proposed anesthesia with the patient or authorized representative who has indicated his/her understanding and acceptance.     Dental Advisory Given  Plan Discussed with: Anesthesiologist, CRNA and Surgeon  Anesthesia Plan Comments: (Patient consented for risks of anesthesia including but not limited to:  - adverse reactions to medications - damage to eyes, teeth, lips or other oral mucosa - nerve damage due to positioning  - sore throat or hoarseness - Damage to heart, brain, nerves, lungs, other parts of body or loss of life  Patient voiced understanding and assent.)         Anesthesia Quick Evaluation

## 2024-05-16 ENCOUNTER — Encounter
Admission: RE | Disposition: A | Discharge status: Home / Self Care | Payer: Self-pay | Source: Home / Self Care | Attending: Ophthalmology

## 2024-05-16 ENCOUNTER — Ambulatory Visit: Payer: Self-pay | Admitting: Anesthesiology

## 2024-05-16 ENCOUNTER — Ambulatory Visit
Admission: RE | Admit: 2024-05-16 | Discharge: 2024-05-16 | Disposition: A | Discharge status: Home / Self Care | Attending: Ophthalmology | Admitting: Ophthalmology

## 2024-05-16 ENCOUNTER — Encounter: Payer: Self-pay | Admitting: Ophthalmology

## 2024-05-16 ENCOUNTER — Other Ambulatory Visit: Payer: Self-pay

## 2024-05-16 DIAGNOSIS — H2511 Age-related nuclear cataract, right eye: Secondary | ICD-10-CM | POA: Insufficient documentation

## 2024-05-16 DIAGNOSIS — E039 Hypothyroidism, unspecified: Secondary | ICD-10-CM | POA: Diagnosis not present

## 2024-05-16 DIAGNOSIS — I10 Essential (primary) hypertension: Secondary | ICD-10-CM | POA: Insufficient documentation

## 2024-05-16 DIAGNOSIS — Z950 Presence of cardiac pacemaker: Secondary | ICD-10-CM | POA: Insufficient documentation

## 2024-05-16 DIAGNOSIS — G4730 Sleep apnea, unspecified: Secondary | ICD-10-CM | POA: Insufficient documentation

## 2024-05-16 DIAGNOSIS — Z87891 Personal history of nicotine dependence: Secondary | ICD-10-CM | POA: Insufficient documentation

## 2024-05-16 DIAGNOSIS — I2510 Atherosclerotic heart disease of native coronary artery without angina pectoris: Secondary | ICD-10-CM | POA: Diagnosis not present

## 2024-05-16 HISTORY — DX: Ventricular premature depolarization: I49.3

## 2024-05-16 HISTORY — DX: Takotsubo syndrome: I51.81

## 2024-05-16 HISTORY — DX: Major depressive disorder, single episode, unspecified: F32.9

## 2024-05-16 HISTORY — DX: Other specified atrial septal defect: Q21.19

## 2024-05-16 HISTORY — DX: Other supraventricular tachycardia: I47.19

## 2024-05-16 HISTORY — PX: CATARACT EXTRACTION W/PHACO: SHX586

## 2024-05-16 HISTORY — DX: Hyperlipidemia, unspecified: E78.5

## 2024-05-16 HISTORY — DX: Dilated cardiomyopathy: I42.0

## 2024-05-16 HISTORY — DX: Other forms of dyspnea: R06.09

## 2024-05-16 HISTORY — DX: Unspecified atrial flutter: I48.92

## 2024-05-16 HISTORY — DX: Presence of cardiac pacemaker: Z95.0

## 2024-05-16 HISTORY — DX: Rheumatic tricuspid insufficiency: I07.1

## 2024-05-16 HISTORY — DX: Bradycardia, unspecified: R00.1

## 2024-05-16 HISTORY — DX: Supraventricular tachycardia, unspecified: I47.10

## 2024-05-16 HISTORY — DX: Unspecified hearing loss, left ear: H91.92

## 2024-05-16 SURGERY — PHACOEMULSIFICATION, CATARACT, WITH IOL INSERTION
Anesthesia: Monitor Anesthesia Care | Site: Eye | Laterality: Right

## 2024-05-16 MED ORDER — CEFUROXIME OPHTHALMIC INJECTION 1 MG/0.1 ML
INJECTION | OPHTHALMIC | Status: DC | PRN
  Administered 2024-05-16: .1 mL via INTRACAMERAL

## 2024-05-16 MED ORDER — MIDAZOLAM HCL 2 MG/2ML IJ SOLN
INTRAMUSCULAR | Status: AC
  Filled 2024-05-16: qty 2

## 2024-05-16 MED ORDER — LACTATED RINGERS IV SOLN: INTRAVENOUS | Status: DC

## 2024-05-16 MED ORDER — FENTANYL CITRATE (PF) 100 MCG/2ML IJ SOLN
INTRAMUSCULAR | Status: DC | PRN
  Administered 2024-05-16 (×2): 50 ug via INTRAVENOUS

## 2024-05-16 MED ORDER — TETRACAINE HCL 0.5 % OP SOLN
1.0000 [drp] | OPHTHALMIC | Status: DC | PRN
  Administered 2024-05-16 (×3): 1 [drp] via OPHTHALMIC

## 2024-05-16 MED ORDER — SIGHTPATH DOSE#1 BSS IO SOLN
INTRAOCULAR | Status: DC | PRN
  Administered 2024-05-16: 15 mL via INTRAOCULAR

## 2024-05-16 MED ORDER — TETRACAINE HCL 0.5 % OP SOLN
OPHTHALMIC | Status: AC
  Filled 2024-05-16: qty 4

## 2024-05-16 MED ORDER — SIGHTPATH DOSE#1 BSS IO SOLN
INTRAOCULAR | Status: DC | PRN
  Administered 2024-05-16: 58 mL via OPHTHALMIC

## 2024-05-16 MED ORDER — LIDOCAINE HCL (PF) 2 % IJ SOLN
INTRAOCULAR | Status: DC | PRN
  Administered 2024-05-16: 2 mL

## 2024-05-16 MED ORDER — SIGHTPATH DOSE#1 NA HYALUR & NA CHOND-NA HYALUR IO KIT
PACK | INTRAOCULAR | Status: DC | PRN
  Administered 2024-05-16: 1 via OPHTHALMIC

## 2024-05-16 MED ORDER — BRIMONIDINE TARTRATE-TIMOLOL 0.2-0.5 % OP SOLN
OPHTHALMIC | Status: DC | PRN
  Administered 2024-05-16: 1 [drp] via OPHTHALMIC

## 2024-05-16 MED ORDER — MIDAZOLAM HCL 2 MG/2ML IJ SOLN
INTRAMUSCULAR | Status: DC | PRN
  Administered 2024-05-16: 2 mg via INTRAVENOUS

## 2024-05-16 MED ORDER — ARMC OPHTHALMIC DILATING DROPS
1.0000 | OPHTHALMIC | Status: DC | PRN
  Administered 2024-05-16 (×3): 1 via OPHTHALMIC

## 2024-05-16 MED ORDER — FENTANYL CITRATE (PF) 100 MCG/2ML IJ SOLN
INTRAMUSCULAR | Status: AC
  Filled 2024-05-16: qty 2

## 2024-05-16 MED ORDER — ARMC OPHTHALMIC DILATING DROPS
OPHTHALMIC | Status: AC
  Filled 2024-05-16: qty 0.5

## 2024-05-16 SURGICAL SUPPLY — 8 items
FEE CATARACT SUITE SIGHTPATH (MISCELLANEOUS) ×1 IMPLANT
GLOVE BIOGEL PI IND STRL 8 (GLOVE) ×1 IMPLANT
GLOVE SURG LX STRL 7.5 STRW (GLOVE) ×1 IMPLANT
GLOVE SURG SYN 6.5 PF PI BL (GLOVE) ×1 IMPLANT
LENS IOL EYHANCE TRC 225 23.0 IMPLANT
NDL FILTER BLUNT 18X1 1/2 (NEEDLE) ×1 IMPLANT
NEEDLE FILTER BLUNT 18X1 1/2 (NEEDLE) ×1 IMPLANT
SYR 3ML LL SCALE MARK (SYRINGE) ×1 IMPLANT

## 2024-05-16 NOTE — H&P (Signed)
 [facility]   Primary Care Physician:  Abbey Bruckner, MD Ophthalmologist: Dr. Dene Etienne  Pre-Procedure History & Physical: HPI:  Joy Vaughan is a 73 y.o. female here for ophthalmic surgery.   Past Medical History:  Diagnosis Date   Acquired hypothyroidism 04/28/2022   Atrial flutter (HCC)    Atrial septal defect and mitral stenosis    Atrial tachycardia    Colonic diverticular abscess 03/20/2023   Deafness in left ear    Dilated cardiomyopathy (HCC)    Diverticulitis 09/26/2018   Diverticulitis of colon 08/16/2009   Diverticulosis    DOE (dyspnea on exertion)    GERD (gastroesophageal reflux disease)    Hyperlipemia    Hypertension    Iron deficiency anemia 04/10/2023   Major depression    Mild tricuspid regurgitation by prior echocardiogram    Presence of permanent cardiac pacemaker    PVC's (premature ventricular contractions)    Sinus bradycardia    Sleep apnea    Stress-induced cardiomyopathy    SVT (supraventricular tachycardia)    Symptomatic bradycardia     Past Surgical History:  Procedure Laterality Date   ANKLE ARTHROSCOPY WITH REPAIR SUBLUXING TENDON Right    CARDIAC CATHETERIZATION     COLONOSCOPY WITH PROPOFOL  N/A 05/24/2023   Procedure: COLONOSCOPY WITH PROPOFOL ;  Surgeon: Maryruth Ole DASEN, MD;  Location: [facility] ENDOSCOPY;  Service: Endoscopy;  Laterality: N/A;   PACEMAKER IMPLANT N/A 03/11/2022   Procedure: PACEMAKER IMPLANT;  Surgeon: Ammon Blunt, MD;  Location: [facility] INVASIVE CV LAB;  Service: Cardiovascular;  Laterality: N/A;   PACEMAKER PLACEMENT Left 03/11/2022   PARTIAL HYSTERECTOMY     1987   REPLACEMENT TOTAL KNEE     REPLACEMENT TOTAL KNEE Right    twice   REPLACEMENT TOTAL KNEE Left    2016   TOTAL HIP ARTHROPLASTY Bilateral    2018,2019    Prior to Admission medications   Medication Sig Start Date End Date Taking? Authorizing Provider  acetaminophen  (TYLENOL ) 500 MG tablet Take 500 mg by mouth every  6 (six) hours as needed for mild pain, headache or fever.   Yes [provider]  losartan -hydrochlorothiazide  (HYZAAR) 100-25 MG tablet Take 1 tablet by mouth daily. 03/29/24  Yes Bair, Kalpana, MD  Magnesium Oxide -Mg Supplement 500 MG TABS Take 500 mg by mouth daily.   Yes [provider]  omeprazole  (PRILOSEC) 20 MG capsule Take 1 capsule (20 mg total) by mouth daily. 03/29/24  Yes Bair, Kalpana, MD  PARoxetine  (PAXIL ) 20 MG tablet Take 1 tablet (20 mg total) by mouth daily. 03/29/24  Yes Bair, Kalpana, MD  tirzepatide  (ZEPBOUND ) 2.5 MG/0.5ML injection vial Inject 2.5 mg into the skin once a week. Patient not taking: Reported on 05/10/2024 04/02/24   Bair, Kalpana, MD    Allergies as of 04/11/2024 - Review Complete 03/29/2024  Allergen Reaction Noted   Ezetimibe  07/01/2020   Statins  07/02/2020   Sulfa antibiotics Itching and Rash 01/29/2015    Family History  Problem Relation Age of Onset   Hypertension Mother    Prostate cancer Neg Hx    Bladder Cancer Neg Hx    Testicular cancer Neg Hx    Kidney cancer Neg Hx     Social History   Socioeconomic History   Marital status: Married    Spouse name: Lorrene   Number of children: 1   Years of education: Not on file   Highest education level: Not on file  Occupational History   Not  on file  Tobacco Use   Smoking status: Former    Passive exposure: Past   Smokeless tobacco: Never  Vaping Use   Vaping status: Never Used  Substance and Sexual Activity   Alcohol use: No   Drug use: No   Sexual activity: Not Currently  Other Topics Concern   Not on file  Social History Narrative   Lives with spouse Lorrene   Social Drivers of Health   Financial Resource Strain: Low Risk  (05/13/2023)   Overall Financial Resource Strain (CARDIA)    Difficulty of Paying Living Expenses: Not hard at all  Food Insecurity: No Food Insecurity (05/13/2023)   Hunger Vital Sign    Worried About Running Out of Food in the Last Year:  Never true    Ran Out of Food in the Last Year: Never true  Transportation Needs: No Transportation Needs (05/13/2023)   PRAPARE - Administrator, Civil Service (Medical): No    Lack of Transportation (Non-Medical): No  Physical Activity: Inactive (05/13/2023)   Exercise Vital Sign    Days of Exercise per Week: 0 days    Minutes of Exercise per Session: 0 min  Stress: Stress Concern Present (05/13/2023)   [employer] of Occupational Health - Occupational Stress Questionnaire    Feeling of Stress : To some extent  Social Connections: Socially Integrated (05/13/2023)   Social Connection and Isolation Panel    Frequency of Communication with Friends and Family: More than three times a week    Frequency of Social Gatherings with Friends and Family: Twice a week    Attends Religious Services: More than 4 times per year    Active Member of [employer] or Organizations: Yes    Attends Engineer, structural: More than 4 times per year    Marital Status: Married  Catering manager Violence: Not At Risk (05/13/2023)   Humiliation, Afraid, Rape, and Kick questionnaire    Fear of Current or Ex-Partner: No    Emotionally Abused: No    Physically Abused: No    Sexually Abused: No    Review of Systems: See HPI, otherwise negative ROS  Physical Exam: BP 137/61   Pulse 68   Temp (!) 97.2 F (36.2 C) (Temporal)   Resp 20   Ht 5' 5 (1.651 m)   Wt 97.1 kg   SpO2 95%   BMI 35.61 kg/m  General:   Alert,  pleasant and cooperative in NAD Head:  Normocephalic and atraumatic. Lungs:  Clear to auscultation.    Heart:  Regular rate and rhythm.   Impression/Plan: Joy Vaughan is here for ophthalmic surgery.  Risks, benefits, limitations, and alternatives regarding ophthalmic surgery have been reviewed with the patient.  Questions have been answered.  All parties agreeable.   [name], MD  05/16/2024, 11:25 AM

## 2024-05-16 NOTE — Transfer of Care (Signed)
 Immediate Anesthesia Transfer of Care Note  Patient: [name]  Procedure(s) Performed: PHACOEMULSIFICATION, CATARACT, WITH IOL INSERTION 5.59 00:34.2 (Right: Eye)  Patient Location: PACU  Anesthesia Type: MAC  Level of Consciousness: awake, alert  and patient cooperative  Airway and Oxygen Therapy: Patient Spontanous Breathing   Post-op Assessment: Post-op Vital signs reviewed, Patient's Cardiovascular Status Stable, Respiratory Function Stable, Patent Airway and No signs of Nausea or vomiting  Post-op Vital Signs: Reviewed and stable  Complications: No notable events documented.

## 2024-05-16 NOTE — Anesthesia Postprocedure Evaluation (Signed)
 Anesthesia Post Note  Patient: Rock Kay Hisle  Procedure(s) Performed: PHACOEMULSIFICATION, CATARACT, WITH IOL INSERTION 5.59 00:34.2 (Right: Eye)  Patient location during evaluation: PACU Anesthesia Type: MAC Level of consciousness: awake and alert Pain management: pain level controlled Vital Signs Assessment: post-procedure vital signs reviewed and stable Respiratory status: spontaneous breathing, nonlabored ventilation, respiratory function stable and patient connected to nasal cannula oxygen Cardiovascular status: stable and blood pressure returned to baseline Postop Assessment: no apparent nausea or vomiting Anesthetic complications: no   No notable events documented.   Last Vitals:  Vitals:   05/16/24 1224 05/16/24 1228  BP: 118/62 (!) 137/59  Pulse: 69 65  Resp: 14 15  Temp: (!) 36.4 C (!) 36.4 C  SpO2: 93% 94%    Last Pain:  Vitals:   05/16/24 1228  TempSrc:   PainSc: 0-No pain                 Sriya Kroeze C Leita Lindbloom

## 2024-05-16 NOTE — Op Note (Signed)
 LOCATION:  [facility]   PREOPERATIVE DIAGNOSIS:  Nuclear sclerotic cataract of the right eye.  H25.11   POSTOPERATIVE DIAGNOSIS:  Nuclear sclerotic cataract of the right eye.   PROCEDURE:  Phacoemulsification with Toric posterior chamber intraocular lens placement of the right eye.  Ultrasound time: Procedure(s): PHACOEMULSIFICATION, CATARACT, WITH IOL INSERTION 5.59 00:34.2 (Right)  LENS:   Implant Name Type Inv. Item Serial No. Manufacturer Lot No. LRB No. Used Action  LENS IOL EYHANCE TRC 225 23.0 - [id number]  LENS IOL EYHANCE TRC 225 23.0 [id number] [employer]  Right 1 Implanted     [id number] Toric intraocular lens with 2.25 diopters of cylindrical power with axis orientation at 51 degrees.   SURGEON:  Dene FABIENE Etienne, MD   ANESTHESIA: Topical with tetracaine drops and 2% Xylocaine  jelly, augmented with 1% preservative-free intracameral lidocaine . .   COMPLICATIONS:  None.   DESCRIPTION OF PROCEDURE:  The patient was identified in the holding room and transported to the operating suite and placed in the supine position under the operating microscope.  The right eye was identified as the operative eye, and it was prepped and draped in the usual sterile ophthalmic fashion.    A clear-corneal paracentesis incision was made at the 12:00 position.  0.5 ml of preservative-free 1% lidocaine  was injected into the anterior chamber. The anterior chamber was filled with Viscoat.  A 2.4 millimeter near clear corneal incision was then made at the 9:00 position.  A cystotome and capsulorrhexis forceps were then used to make a curvilinear capsulorrhexis.  Hydrodissection and hydrodelineation were then performed using balanced salt solution.   Phacoemulsification was then used in stop and chop fashion to remove the lens, nucleus and epinucleus.  The remaining cortex was aspirated using the irrigation and aspiration handpiece.  Provisc viscoelastic was then placed into the capsular bag  to distend it for lens placement.  The Verion digital marker was used to align the implant at the intended axis.   A Toric lens was then injected into the capsular bag.  It was rotated clockwise until the axis marks on the lens were approximately 15 degrees in the counterclockwise direction to the intended alignment.  The viscoelastic was aspirated from the eye using the irrigation aspiration handpiece.  Then, a Koch spatula through the sideport incision was used to rotate the lens in a clockwise direction until the axis markings of the intraocular lens were lined up with the Verion alignment.  Balanced salt solution was then used to hydrate the wounds. Cefuroxime 0.1 ml of a 10mg /ml solution was injected into the anterior chamber for a dose of 1 mg of intracameral antibiotic at the completion of the case.    The eye was noted to have a physiologic pressure and there was no wound leak noted.   Timolol and Brimonidine drops were applied to the eye.  The patient was taken to the recovery room in stable condition having had no complications of anesthesia or surgery.  Donnielle Addison 05/16/2024, 12:22 PM

## 2024-05-21 ENCOUNTER — Ambulatory Visit: Payer: Medicare Other | Admitting: *Deleted

## 2024-05-21 VITALS — Ht 65.0 in | Wt 210.0 lb

## 2024-05-21 DIAGNOSIS — Z0000 Encounter for general adult medical examination without abnormal findings: Secondary | ICD-10-CM

## 2024-05-21 NOTE — Patient Instructions (Signed)
 Joy Vaughan,  Thank you for taking the time for your Medicare Wellness Visit. I appreciate your continued commitment to your health goals. Please review the care plan we discussed, and feel free to reach out if I can assist you further.  Medicare recommends these wellness visits once per year to help you and your care team stay ahead of potential health issues. These visits are designed to focus on prevention, allowing your provider to concentrate on managing your acute and chronic conditions during your regular appointments.  Please note that Annual Wellness Visits do not include a physical exam. Some assessments may be limited, especially if the visit was conducted virtually. If needed, we may recommend a separate in-person follow-up with your provider.  Ongoing Care Seeing your primary care provider every 3 to 6 months helps us  monitor your health and provide consistent, personalized care.  Consider updating your shingles vaccines.  Referrals If a referral was made during today's visit and you haven't received any updates within two weeks, please contact the referred provider directly to check on the status.  Recommended Screenings:  Health Maintenance  Topic Date Due   Zoster (Shingles) Vaccine (1 of 2) 01/15/1970   COVID-19 Vaccine (7 - 2025-26 season) 04/16/2024   DTaP/Tdap/Td vaccine (3 - Td or Tdap) 09/27/2024*   Pneumococcal Vaccine for age over 35 (1 of 1 - PCV) 09/27/2024*   Flu Shot  11/13/2024*   Medicare Annual Wellness Visit  05/21/2025   Colon Cancer Screening  05/23/2033   Meningitis B Vaccine  Aged Out   Breast Cancer Screening  Discontinued   DEXA scan (bone density measurement)  Discontinued   Hepatitis C Screening  Discontinued  *Topic was postponed. The date shown is not the original due date.       05/21/2024    1:25 PM  Advanced Directives  Does Patient Have a Medical Advance Directive? Yes  Type of Estate agent of [place];Living will   Copy of Healthcare Power of Attorney in Chart? No - copy requested   Advance Care Planning is important because it: Ensures you receive medical care that aligns with your values, goals, and preferences. Provides guidance to your family and loved ones, reducing the emotional burden of decision-making during critical moments.  Vision: Annual vision screenings are recommended for early detection of glaucoma, cataracts, and diabetic retinopathy. These exams can also reveal signs of chronic conditions such as diabetes and high blood pressure.  Dental: Annual dental screenings help detect early signs of oral cancer, gum disease, and other conditions linked to overall health, including heart disease and diabetes.  Please see the attached documents for additional preventive care recommendations.

## 2024-05-21 NOTE — Progress Notes (Signed)
 Subjective:   Joy Vaughan is a 73 y.o. who presents for a Medicare Wellness preventive visit.  As a reminder, Annual Wellness Visits don't include a physical exam, and some assessments may be limited, especially if this visit is performed virtually. We may recommend an in-person follow-up visit with your provider if needed.  Visit Complete: Virtual I connected with  Joy Vaughan on 05/21/24 by a audio enabled telemedicine application and verified that I am speaking with the correct person using two identifiers.  Patient Location: Home  Provider Location: Home Office  I discussed the limitations of evaluation and management by telemedicine. The patient expressed understanding and agreed to proceed.  Vital Signs: Because this visit was a virtual/telehealth visit, some criteria may be missing or patient reported. Any vitals not documented were not able to be obtained and vitals that have been documented are patient reported.  VideoDeclined- This patient declined Librarian, academic. Therefore the visit was completed with audio only.  Persons Participating in Visit: Patient.  AWV Questionnaire: No: Patient Medicare AWV questionnaire was not completed prior to this visit.  Cardiac Risk Factors include: advanced age (>96men, >1 women);hypertension;obesity (BMI >30kg/m2);dyslipidemia;Other (see comment), Risk factor comments: pacemaker     Objective:    Today's Vitals   05/21/24 1305  Weight: 210 lb (95.3 kg)  Height: 5' 5 (1.651 m)  PainSc: 5    Body mass index is 34.95 kg/m.     05/21/2024    1:25 PM 05/16/2024   11:09 AM 05/13/2023   11:22 AM 03/30/2023    2:00 PM 03/30/2023    8:40 AM 03/20/2023    6:13 PM 03/20/2023   11:40 AM  Advanced Directives  Does Patient Have a Medical Advance Directive? Yes Yes Yes Yes Yes Yes Yes  Type of Estate agent of [place];Living will Healthcare Power of [place];Living will  Healthcare Power of [place];Living will Living will Living will Living will   Does patient want to make changes to medical advance directive?    No - Patient declined  No - Patient declined   Copy of Healthcare Power of Attorney in Chart? No - copy requested  No - copy requested      Would patient like information on creating a medical advance directive?    No - Patient declined  No - Patient declined     Current Medications (verified) Outpatient Encounter Medications as of 05/21/2024  Medication Sig   acetaminophen  (TYLENOL ) 500 MG tablet Take 500 mg by mouth every 6 (six) hours as needed for mild pain, headache or fever.   calcium  carbonate (CALCIUM  600) 600 MG TABS tablet Take 600 mg by mouth daily.   cyanocobalamin  (VITAMIN B12) 1000 MCG/ML injection Inject 1,000 mcg into the muscle once.   losartan -hydrochlorothiazide  (HYZAAR) 100-25 MG tablet Take 1 tablet by mouth daily.   Magnesium Oxide -Mg Supplement 500 MG TABS Take 500 mg by mouth daily. (Patient taking differently: Take 250 mg by mouth daily.)   omeprazole  (PRILOSEC) 20 MG capsule Take 1 capsule (20 mg total) by mouth daily.   PARoxetine  (PAXIL ) 20 MG tablet Take 1 tablet (20 mg total) by mouth daily.   TURMERIC CURCUMIN PO Take by mouth daily.   Vitamin D -Vitamin K (VITAMIN K2-VITAMIN D3 PO) Take by mouth daily.   tirzepatide  (ZEPBOUND ) 2.5 MG/0.5ML injection vial Inject 2.5 mg into the skin once a week. (Patient not taking: Reported on 05/21/2024)   No facility-administered encounter medications on file as  of 05/21/2024.    Allergies (verified) Ezetimibe, Statins, and Sulfa antibiotics   History: Past Medical History:  Diagnosis Date   Acquired hypothyroidism 04/28/2022   Atrial flutter (HCC)    Atrial septal defect and mitral stenosis    Atrial tachycardia    Colonic diverticular abscess 03/20/2023   Deafness in left ear    Dilated cardiomyopathy (HCC)    Diverticulitis 09/26/2018   Diverticulitis of colon  08/16/2009   Diverticulosis    DOE (dyspnea on exertion)    GERD (gastroesophageal reflux disease)    Hyperlipemia    Hypertension    Iron deficiency anemia 04/10/2023   Major depression    Mild tricuspid regurgitation by prior echocardiogram    Presence of permanent cardiac pacemaker    PVC's (premature ventricular contractions)    Sinus bradycardia    Sleep apnea    Stress-induced cardiomyopathy    SVT (supraventricular tachycardia)    Symptomatic bradycardia    Past Surgical History:  Procedure Laterality Date   ANKLE ARTHROSCOPY WITH REPAIR SUBLUXING TENDON Right    CARDIAC CATHETERIZATION     CATARACT EXTRACTION W/PHACO Right 05/16/2024   Procedure: PHACOEMULSIFICATION, CATARACT, WITH IOL INSERTION 5.59 00:34.2;  Surgeon: [name], MD;  Location: [facility];  Service: Ophthalmology;  Laterality: Right;   COLONOSCOPY WITH PROPOFOL  N/A 05/24/2023   Procedure: COLONOSCOPY WITH PROPOFOL ;  Surgeon: Maryruth Ole DASEN, MD;  Location: [facility];  Service: Endoscopy;  Laterality: N/A;   PACEMAKER IMPLANT N/A 03/11/2022   Procedure: PACEMAKER IMPLANT;  Surgeon: Ammon Blunt, MD;  Location: [facility]VASIVE [facility];  Service: Cardiovascular;  Laterality: N/A;   PACEMAKER PLACEMENT Left 03/11/2022   PARTIAL HYSTERECTOMY     1987   REPLACEMENT TOTAL KNEE     REPLACEMENT TOTAL KNEE Right    twice   REPLACEMENT TOTAL KNEE Left    2016   TOTAL HIP ARTHROPLASTY Bilateral    2018,2019   Family History  Problem Relation Age of Onset   Hypertension Mother    Prostate cancer Neg Hx    Bladder Cancer Neg Hx    Testicular cancer Neg Hx    Kidney cancer Neg Hx    Social History   Socioeconomic History   Marital status: Married    Spouse name: Lorrene   Number of children: 1   Years of education: Not on file   Highest education level: Not on file  Occupational History   Not on file  Tobacco Use   Smoking status: Former    Passive exposure: Past    Smokeless tobacco: Never  Vaping Use   Vaping status: Never Used  Substance and Sexual Activity   Alcohol use: No   Drug use: No   Sexual activity: Not Currently  Other Topics Concern   Not on file  Social History Narrative   Lives with spouse Lorrene   Social Drivers of Health   Financial Resource Strain: Low Risk  (05/21/2024)   Overall Financial Resource Strain (CARDIA)    Difficulty of Paying Living Expenses: Not hard at all  Food Insecurity: No Food Insecurity (05/21/2024)   Hunger Vital Sign    Worried About Running Out of Food in the Last Year: Never true    Ran Out of Food in the Last Year: Never true  Transportation Needs: No Transportation Needs (05/21/2024)   PRAPARE - Administrator, Civil Service (Medical): No    Lack of Transportation (Non-Medical): No  Physical Activity: Inactive (05/21/2024)  Exercise Vital Sign    Days of Exercise per Week: 0 days    Minutes of Exercise per Session: 0 min  Stress: No Stress Concern Present (05/21/2024)   [employer] of Occupational Health - Occupational Stress Questionnaire    Feeling of Stress: Not at all  Social Connections: Socially Integrated (05/21/2024)   Social Connection and Isolation Panel    Frequency of Communication with Friends and Family: More than three times a week    Frequency of Social Gatherings with Friends and Family: More than three times a week    Attends Religious Services: More than 4 times per year    Active Member of [employer] or Organizations: Yes    Attends Engineer, structural: More than 4 times per year    Marital Status: Married    Tobacco Counseling Counseling given: Not Answered    Clinical Intake:  Pre-visit preparation completed: Yes  Pain : No/denies pain Pain Score: 5  Pain Type: Chronic pain Pain Location: Back Pain Descriptors / Indicators: Aching     BMI - recorded: 34.95 Nutritional Status: BMI > 30  Obese Nutritional Risks: None Diabetes: No  Lab  Results  Component Value Date   HGBA1C 6.3 03/29/2024     How often do you need to have someone help you when you read instructions, pamphlets, or other written materials from your doctor or pharmacy?: 1 - Never  Interpreter Needed?: No  Information entered by :: R. Daryan Buell LPN   Activities of Daily Living     05/21/2024    1:08 PM  In your present state of health, do you have any difficulty performing the following activities:  Hearing? 1  Vision? 0  Difficulty concentrating or making decisions? 1  Walking or climbing stairs? 1  Dressing or bathing? 0  Doing errands, shopping? 1  Preparing Food and eating ? N  Using the Toilet? N  In the past six months, have you accidently leaked urine? Y  Do you have problems with loss of bowel control? N  Managing your Medications? N  Managing your Finances? N  Housekeeping or managing your Housekeeping? Y    Patient Care Team: Bair, Kalpana, MD as PCP - General (Family Medicine) Ammon Blunt, MD as Consulting Physician (Cardiology) Maryruth Ole DASEN, MD as Consulting Physician (Gastroenterology) [name], MD as Referring Physician (Ophthalmology)  I have updated your Care Teams any recent Medical Services you may have received from other providers in the past year.     Assessment:   This is a routine wellness examination for [place].  Hearing/Vision screen Hearing Screening - Comments:: Wears aids Vision Screening - Comments:: readers   Goals Addressed             This Visit's Progress    Patient Stated       Hopes the procedure that she is getting ready to have will improve her back problem so that she can do more       Depression Screen     05/21/2024    1:18 PM 03/29/2024   11:10 AM 09/28/2023    1:17 PM 05/13/2023   11:14 AM 03/28/2023    2:21 PM  PHQ 2/9 Scores  PHQ - 2 Score 0 2 0 0 0  PHQ- 9 Score 6 14 7 3 6     Fall Risk     05/21/2024    1:10 PM 03/29/2024   11:10 AM 05/13/2023   11:09  AM 04/11/2023    3:27  PM 03/28/2023    2:20 PM  Fall Risk   Falls in the past year? 0 0 0 0 0  Number falls in past yr: 0 0 0 0 0  Injury with Fall? 0 0 0 0 0  Risk for fall due to : No Fall Risks No Fall Risks No Fall Risks  No Fall Risks  Follow up Falls evaluation completed;Falls prevention discussed Falls evaluation completed Falls prevention discussed;Falls evaluation completed  Falls evaluation completed    MEDICARE RISK AT HOME:  Medicare Risk at Home Any stairs in or around the home?: Yes If so, are there any without handrails?: No Home free of loose throw rugs in walkways, pet beds, electrical cords, etc?: Yes Adequate lighting in your home to reduce risk of falls?: Yes Life alert?: No Use of a cane, walker or w/c?: Yes Grab bars in the bathroom?: Yes Shower chair or bench in shower?: Yes Elevated toilet seat or a handicapped toilet?: Yes  TIMED UP AND GO:  Was the test performed?  No  Cognitive Function: 6CIT completed        05/21/2024    1:25 PM 05/13/2023   11:22 AM  6CIT Screen  What Year? 0 points 0 points  What month? 0 points 0 points  What time? 0 points 0 points  Count back from 20 0 points 0 points  Months in reverse 0 points 0 points  Repeat phrase 0 points 2 points  Total Score 0 points 2 points    Immunizations Immunization History  Administered Date(s) Administered   Influenza, Seasonal, Injecte, Preservative Fre 05/23/2012   PFIZER Comirnaty(Gray Top)Covid-19 Tri-Sucrose Vaccine 09/26/2019, 10/17/2019, 05/07/2020   PFIZER(Purple Top)SARS-COV-2 Vaccination 09/26/2019, 10/17/2019, 05/12/2020   Tdap 07/20/2012, 07/03/2013   Zoster, Live 06/09/2012    Screening Tests Health Maintenance  Topic Date Due   Zoster Vaccines- Shingrix (1 of 2) 01/15/1970   COVID-19 Vaccine (7 - 2025-26 season) 04/16/2024   Medicare Annual Wellness (AWV)  05/12/2024   DTaP/Tdap/Td (3 - Td or Tdap) 09/27/2024 (Originally 07/04/2023)   Pneumococcal Vaccine: 50+  Years (1 of 1 - PCV) 09/27/2024 (Originally 01/16/2000)   Influenza Vaccine  11/13/2024 (Originally 03/16/2024)   Colonoscopy  05/23/2033   Meningococcal B Vaccine  Aged Out   Mammogram  Discontinued   DEXA SCAN  Discontinued   Hepatitis C Screening  Discontinued    Health Maintenance Items Addressed: Patient declines vaccines but is considering the shingles vaccines. Patient declines mammogram and Dexa.   Additional Screening:  Vision Screening: Recommended annual ophthalmology exams for early detection of glaucoma and other disorders of the eye. Is the patient up to date with their annual eye exam?  Yes  Who is the provider or what is the name of the office in which the patient attends annual eye exams? [facility]  Dental Screening: Recommended annual dental exams for proper oral hygiene  Community Resource Referral / Chronic Care Management: CRR required this visit?  No   CCM required this visit?  No   Plan:    I have personally reviewed and noted the following in the patient's chart:   Medical and social history Use of alcohol, tobacco or illicit drugs  Current medications and supplements including opioid prescriptions. Patient is not currently taking opioid prescriptions. Functional ability and status Nutritional status Physical activity Advanced directives List of other physicians Hospitalizations, surgeries, and ER visits in previous 12 months Vitals Screenings to include cognitive, depression, and falls Referrals and appointments  In addition,  I have reviewed and discussed with patient certain preventive protocols, quality metrics, and best practice recommendations. A written personalized care plan for preventive services as well as general preventive health recommendations were provided to patient.   Angeline Fredericks, LPN   [id number]/10/[id number]   After Visit Summary: (MyChart) Due to this being a telephonic visit, the after visit summary with patients personalized plan was  offered to patient via MyChart   Notes: Nothing significant to report at this time.

## 2024-05-28 NOTE — Discharge Instructions (Signed)

## 2024-05-29 NOTE — Anesthesia Preprocedure Evaluation (Signed)
 Anesthesia Evaluation  Patient identified by MRN, date of birth, ID band Patient awake    Reviewed: Allergy & Precautions, H&P , NPO status , Patient's Chart, lab work & pertinent test results  Airway Mallampati: II  TM Distance: >3 FB Neck ROM: Full    Dental no notable dental hx. (+) Implants, Caps Implants, Caps Top teeth central and lateral incisors are implants with a snap on dental piece, and lower teeth are capped. :   Pulmonary neg pulmonary ROS, former smoker   Pulmonary exam normal breath sounds clear to auscultation       Cardiovascular hypertension, negative cardio ROS Normal cardiovascular exam Rhythm:Regular Rate:Normal  03-07-24 cardiologist's note: 7. Dual-chamber pacemaker implant 03/11/2022   On 10/11/2021, patient was in [place], [place]  at which time she overdosed on sleeping pills at a rest stop, EMS was called and the patient was admitted to inpatient psychiatry. The patient was also treated for aspiration pneumonia. ECG was reportedly abnormal and troponin elevated to 2000. 2D echocardiogram was performed which revealed LVEF of 35%. Patient was seen by cardiology, and underwent cardiac catheterization on 10/16/2021 at [facility] which did not reveal evidence for significant coronary artery disease and the patient was diagnosed with stress-induced cardiomyopathy (Takotsubo's). The patient was discharged home on metoprolol  succinate, losartan , furosemide and Aldactone.    At a previous office visit, the patient complained of symptomatic bradycardia with heart rates in the 40s and 50s at rest. She also reported exertional dyspnea with minimal exertion, such as walking through her house. The patient called about her low heart rate and metoprolol  succinate was decreased to 12.5 mg daily.    ECG 10/21/2021 revealed sinus rhythm at 71 bpm with evidence of old septal MI. 7-day Holter monitor 12/28/2021 -  01/03/2022 revealed predominant sinus bradycardia with mean heart rate of 57 bpm, heart rate range 40 to 88 bpm, occasional premature atrial contractions, and occasional atrial runs the longest lasting 10 beats. 2D echocardiogram on 02/08/2022 revealed normal left ventricular function with LVEF 50% with mild LVH with mild tricuspid regurgitation, and trivial pericardial effusion.   Pacemaker interrogation revealed normal functioning dual-chamber pacemaker with a longevity of 13.4 years with episodes of atrial tachycardia/flutter in August to December with no recurrent events since then. There was one 7 beat NSVT in November, and 1 12-beat NSVT versus SVT in December.    7-day Holter monitor revealed predominant sinus rhythm with mean heart rate of 71 bpm, sinus heart rate range 58 to 119 bpm. Atrial pacing with ventricular sensing was observed. Occasional premature atrial contractions and premature ventricular contractions were present. Occasional atrial runs were observed the longest lasting 18 beats. There was no correlation with diary entry.    Patient returns for follow-up, reports doing well. She denies chest pain. She does have chronic exertional dyspnea, which is unchanged. She denies palpitations or heart racing. She has chronic lower extremity edema, which is stable on HCTZ. She denies presyncope or syncope.    The patient is not very active and does not exercise regularly due to chronic right knee pain.    Pacemaker interrogation 01/11/2024 revealed normally functioning dual-chamber pacemaker, with underlying sinus rhythm, 4 atrial high rate episodes, longest lasting greater than 2 hours, longevity of 11.9 years. 14-day Holter monitor 01/25/2024 - 02/08/2024 revealed predominant atrial pacing with ventricular sensing at 69 bpm, occasional premature ventricular contractions and premature atrial contractions, occasional brief runs of SVT, longest lasting 14 beats, no prolonged episodes of atrial  fibrillation.   The patient has essential hypertension, blood pressure well controlled today on losartan /HCTZ, which is well-tolerated without apparent side effects. She states that her blood pressure has been as high as 180s-190s/80s recently. She has been off of metoprolol  since prior to her pacemaker.   02-08-22 echo DOPPLER ECHO and OTHER SPECIAL PROCEDURES                 Aortic: No AR                      No AS                         119.5 cm/sec peak vel      5.7 mmHg peak grad                 Mitral: TRIVIAL MR                 No MS                         MV Inflow E Vel = 105.0 cm/sec      MV Annulus E'Vel = 4.8 cm/sec                         E/E'Ratio = 21.9              Tricuspid: MILD TR                    No TS                         253.1 cm/sec peak TR vel   30.6 mmHg peak RV pressure              Pulmonary: TRIVIAL PR                 No PS  _________________________________________________________________________________________  INTERPRETATION  NORMAL LEFT VENTRICULAR SYSTOLIC FUNCTION  NORMAL RIGHT VENTRICULAR SYSTOLIC FUNCTION  MILD VALVULAR REGURGITATION (See above)  NO VALVULAR STENOSIS  TRIVIAL PERICARDIAL EFFUSION    Neuro/Psych negative neurological ROS  negative psych ROS   GI/Hepatic negative GI ROS, Neg liver ROS,,,  Endo/Other  negative endocrine ROS    Renal/GU negative Renal ROS  negative genitourinary   Musculoskeletal negative musculoskeletal ROS (+)    Abdominal   Peds negative pediatric ROS (+)  Hematology negative hematology ROS (+)   Anesthesia Other Findings Previous cataract surgery 05-16-24 Dr. Ola  GERD (gastroesophageal reflux disease) Hypertension Sleep apneaDiverticulosis Acquired hypothyroidism Colonic diverticular abscess Diverticulitis of colon Diverticulitis Iron deficiency anemia Presence of permanent cardiac pacemaker Hyperlipemia Dilated cardiomyopathy (HCC) Sinus bradycardia SVT (supraventricular  tachycardia) Stress-induced cardiomyopathy Major depression Deafness in left ear Atrial flutter (HCC) Atrial tachycardia Symptomatic bradycardia DOE (dyspnea on exertion)             PACs PVC's (premature ventricular contractions) Atrial septal defect and mitral stenosis Mild tricuspid regurg             Old septal MI   Reproductive/Obstetrics negative OB ROS                              Anesthesia Physical Anesthesia Plan  ASA: 3  Anesthesia Plan: MAC   Post-op Pain Management:    Induction: Intravenous  PONV Risk Score and Plan:   Airway Management Planned: Natural Airway and Nasal Cannula  Additional Equipment:   Intra-op Plan:   Post-operative Plan:   Informed Consent: I have reviewed the patients History and Physical, chart, labs and discussed the procedure including the risks, benefits and alternatives for the proposed anesthesia with the patient or authorized representative who has indicated his/her understanding and acceptance.     Dental Advisory Given  Plan Discussed with: Anesthesiologist, CRNA and Surgeon  Anesthesia Plan Comments: (Patient consented for risks of anesthesia including but not limited to:  - adverse reactions to medications - damage to eyes, teeth, lips or other oral mucosa - nerve damage due to positioning  - sore throat or hoarseness - Damage to heart, brain, nerves, lungs, other parts of body or loss of life  Patient voiced understanding and assent.)         Anesthesia Quick Evaluation

## 2024-05-30 ENCOUNTER — Ambulatory Visit: Payer: Self-pay | Admitting: Anesthesiology

## 2024-05-30 ENCOUNTER — Other Ambulatory Visit: Payer: Self-pay

## 2024-05-30 ENCOUNTER — Encounter
Admission: RE | Disposition: A | Discharge status: Home / Self Care | Payer: Self-pay | Source: Home / Self Care | Attending: Ophthalmology

## 2024-05-30 ENCOUNTER — Ambulatory Visit

## 2024-05-30 ENCOUNTER — Encounter: Payer: Self-pay | Admitting: Ophthalmology

## 2024-05-30 ENCOUNTER — Ambulatory Visit
Admission: RE | Admit: 2024-05-30 | Discharge: 2024-05-30 | Disposition: A | Discharge status: Home / Self Care | Attending: Ophthalmology | Admitting: Ophthalmology

## 2024-05-30 DIAGNOSIS — Z961 Presence of intraocular lens: Secondary | ICD-10-CM | POA: Diagnosis not present

## 2024-05-30 DIAGNOSIS — I10 Essential (primary) hypertension: Secondary | ICD-10-CM | POA: Insufficient documentation

## 2024-05-30 DIAGNOSIS — Z79899 Other long term (current) drug therapy: Secondary | ICD-10-CM | POA: Insufficient documentation

## 2024-05-30 DIAGNOSIS — Z9841 Cataract extraction status, right eye: Secondary | ICD-10-CM | POA: Insufficient documentation

## 2024-05-30 DIAGNOSIS — Z87891 Personal history of nicotine dependence: Secondary | ICD-10-CM | POA: Diagnosis not present

## 2024-05-30 DIAGNOSIS — Z950 Presence of cardiac pacemaker: Secondary | ICD-10-CM | POA: Diagnosis not present

## 2024-05-30 DIAGNOSIS — H2512 Age-related nuclear cataract, left eye: Secondary | ICD-10-CM | POA: Diagnosis present

## 2024-05-30 HISTORY — PX: CATARACT EXTRACTION W/PHACO: SHX586

## 2024-05-30 SURGERY — PHACOEMULSIFICATION, CATARACT, WITH IOL INSERTION
Anesthesia: Monitor Anesthesia Care | Site: Eye | Laterality: Left

## 2024-05-30 MED ORDER — ARMC OPHTHALMIC DILATING DROPS
1.0000 | OPHTHALMIC | Status: DC | PRN
  Administered 2024-05-30 (×3): 1 via OPHTHALMIC

## 2024-05-30 MED ORDER — ARMC OPHTHALMIC DILATING DROPS
OPHTHALMIC | Status: AC
  Filled 2024-05-30: qty 0.5

## 2024-05-30 MED ORDER — LIDOCAINE HCL (PF) 2 % IJ SOLN
INTRAOCULAR | Status: DC | PRN
  Administered 2024-05-30: 2 mL

## 2024-05-30 MED ORDER — TETRACAINE HCL 0.5 % OP SOLN
OPHTHALMIC | Status: AC
  Filled 2024-05-30: qty 4

## 2024-05-30 MED ORDER — TETRACAINE HCL 0.5 % OP SOLN
1.0000 [drp] | OPHTHALMIC | Status: DC | PRN
  Administered 2024-05-30 (×3): 1 [drp] via OPHTHALMIC

## 2024-05-30 MED ORDER — BRIMONIDINE TARTRATE-TIMOLOL 0.2-0.5 % OP SOLN
OPHTHALMIC | Status: DC | PRN
  Administered 2024-05-30: 1 [drp] via OPHTHALMIC

## 2024-05-30 MED ORDER — LACTATED RINGERS IV SOLN: INTRAVENOUS | Status: DC

## 2024-05-30 MED ORDER — SIGHTPATH DOSE#1 BSS IO SOLN
INTRAOCULAR | Status: DC | PRN
  Administered 2024-05-30: 15 mL via INTRAOCULAR

## 2024-05-30 MED ORDER — MIDAZOLAM HCL 2 MG/2ML IJ SOLN
INTRAMUSCULAR | Status: DC | PRN
  Administered 2024-05-30: 2 mg via INTRAVENOUS

## 2024-05-30 MED ORDER — CEFUROXIME OPHTHALMIC INJECTION 1 MG/0.1 ML
INJECTION | OPHTHALMIC | Status: DC | PRN
  Administered 2024-05-30: 1 mg via INTRACAMERAL

## 2024-05-30 MED ORDER — MIDAZOLAM HCL 2 MG/2ML IJ SOLN
INTRAMUSCULAR | Status: AC
  Filled 2024-05-30: qty 2

## 2024-05-30 MED ORDER — SIGHTPATH DOSE#1 BSS IO SOLN
INTRAOCULAR | Status: DC | PRN
  Administered 2024-05-30: 48 mL via OPHTHALMIC

## 2024-05-30 MED ORDER — SIGHTPATH DOSE#1 NA HYALUR & NA CHOND-NA HYALUR IO KIT
PACK | INTRAOCULAR | Status: DC | PRN
  Administered 2024-05-30: 1 via OPHTHALMIC

## 2024-05-30 MED ORDER — FENTANYL CITRATE (PF) 100 MCG/2ML IJ SOLN
INTRAMUSCULAR | Status: DC | PRN
  Administered 2024-05-30: 50 ug via INTRAVENOUS

## 2024-05-30 MED ORDER — FENTANYL CITRATE (PF) 100 MCG/2ML IJ SOLN
INTRAMUSCULAR | Status: AC
  Filled 2024-05-30: qty 2

## 2024-05-30 SURGICAL SUPPLY — 8 items
FEE CATARACT SUITE SIGHTPATH (MISCELLANEOUS) ×1 IMPLANT
GLOVE BIOGEL PI IND STRL 8 (GLOVE) ×1 IMPLANT
GLOVE SURG LX STRL 7.5 STRW (GLOVE) ×1 IMPLANT
GLOVE SURG SYN 6.5 PF PI BL (GLOVE) ×1 IMPLANT
LENS IOL TECNIS EYHANCE 22.0 (Intraocular Lens) IMPLANT
NDL FILTER BLUNT 18X1 1/2 (NEEDLE) ×1 IMPLANT
NEEDLE FILTER BLUNT 18X1 1/2 (NEEDLE) ×1 IMPLANT
SYR 3ML LL SCALE MARK (SYRINGE) ×1 IMPLANT

## 2024-05-30 NOTE — Anesthesia Postprocedure Evaluation (Signed)
 Anesthesia Post Note  Patient: Joy Vaughan  Procedure(s) Performed: PHACOEMULSIFICATION, CATARACT, WITH IOL  5.26 00:28.2 (Left: Eye)  Patient location during evaluation: PACU Anesthesia Type: MAC Level of consciousness: awake and alert Pain management: pain level controlled Vital Signs Assessment: post-procedure vital signs reviewed and stable Respiratory status: spontaneous breathing, nonlabored ventilation, respiratory function stable and patient connected to nasal cannula oxygen Cardiovascular status: stable and blood pressure returned to baseline Postop Assessment: no apparent nausea or vomiting Anesthetic complications: no   No notable events documented.   Last Vitals:  Vitals:   05/30/24 1040 05/30/24 1044  BP: (!) 151/68 (!) 146/66  Pulse: 71 64  Resp: 16 16  Temp:  (!) 36.1 C  SpO2: 92% 94%    Last Pain:  Vitals:   05/30/24 1044  TempSrc:   PainSc: 0-No pain                 Kmari Halter C Antionio Negron

## 2024-05-30 NOTE — Transfer of Care (Signed)
 Immediate Anesthesia Transfer of Care Note  Patient: Joy Vaughan  Procedure(s) Performed: PHACOEMULSIFICATION, CATARACT, WITH IOL  5.26 00:28.2 (Left: Eye)  Patient Location: PACU  Anesthesia Type: MAC  Level of Consciousness: awake, alert  and patient cooperative  Airway and Oxygen Therapy: Patient Spontanous Breathing  Post-op Assessment: Post-op Vital signs reviewed, Patient's Cardiovascular Status Stable, Respiratory Function Stable, Patent Airway and No signs of Nausea or vomiting  Post-op Vital Signs: Reviewed and stable  Complications: No notable events documented.

## 2024-05-30 NOTE — H&P (Signed)
 [facility]   Primary Care Physician:  Abbey Bruckner, MD Ophthalmologist: Dr. Dene Etienne  Pre-Procedure History & Physical: HPI:  Joy Vaughan is a 73 y.o. female here for ophthalmic surgery.   Past Medical History:  Diagnosis Date   Acquired hypothyroidism 04/28/2022   Atrial flutter (HCC)    Atrial septal defect and mitral stenosis    Atrial tachycardia    Colonic diverticular abscess 03/20/2023   Deafness in left ear    Dilated cardiomyopathy (HCC)    Diverticulitis 09/26/2018   Diverticulitis of colon 08/16/2009   Diverticulosis    DOE (dyspnea on exertion)    GERD (gastroesophageal reflux disease)    Hyperlipemia    Hypertension    Iron deficiency anemia 04/10/2023   Major depression    Mild tricuspid regurgitation by prior echocardiogram    Presence of permanent cardiac pacemaker    PVC's (premature ventricular contractions)    Sinus bradycardia    Sleep apnea    Stress-induced cardiomyopathy    SVT (supraventricular tachycardia)    Symptomatic bradycardia     Past Surgical History:  Procedure Laterality Date   ANKLE ARTHROSCOPY WITH REPAIR SUBLUXING TENDON Right    CARDIAC CATHETERIZATION     CATARACT EXTRACTION W/PHACO Right 05/16/2024   Procedure: PHACOEMULSIFICATION, CATARACT, WITH IOL INSERTION 5.59 00:34.2;  Surgeon: Etienne Dene, MD;  Location: [facility];  Service: Ophthalmology;  Laterality: Right;   COLONOSCOPY WITH PROPOFOL  N/A 05/24/2023   Procedure: COLONOSCOPY WITH PROPOFOL ;  Surgeon: Maryruth Ole DASEN, MD;  Location: [facility];  Service: Endoscopy;  Laterality: N/A;   PACEMAKER IMPLANT N/A 03/11/2022   Procedure: PACEMAKER IMPLANT;  Surgeon: Ammon Blunt, MD;  Location: [facility] INVASIVE [facility];  Service: Cardiovascular;  Laterality: N/A;   PACEMAKER PLACEMENT Left 03/11/2022   PARTIAL HYSTERECTOMY     1987   REPLACEMENT TOTAL KNEE     REPLACEMENT TOTAL KNEE Right    twice   REPLACEMENT TOTAL  KNEE Left    2016   TOTAL HIP ARTHROPLASTY Bilateral    2018,2019    Prior to Admission medications   Medication Sig Start Date End Date Taking? Authorizing Provider  acetaminophen  (TYLENOL ) 500 MG tablet Take 500 mg by mouth every 6 (six) hours as needed for mild pain, headache or fever.   Yes [provider]  calcium  carbonate (CALCIUM  600) 600 MG TABS tablet Take 600 mg by mouth daily.   Yes [provider]  cyanocobalamin  (VITAMIN B12) 1000 MCG/ML injection Inject 1,000 mcg into the muscle once.   Yes [provider]  losartan -hydrochlorothiazide  (HYZAAR) 100-25 MG tablet Take 1 tablet by mouth daily. 03/29/24  Yes Bair, Kalpana, MD  omeprazole  (PRILOSEC) 20 MG capsule Take 1 capsule (20 mg total) by mouth daily. 03/29/24  Yes Bair, Kalpana, MD  PARoxetine  (PAXIL ) 20 MG tablet Take 1 tablet (20 mg total) by mouth daily. 03/29/24  Yes Bair, Kalpana, MD  TURMERIC CURCUMIN PO Take by mouth daily.   Yes [provider]  Vitamin D -Vitamin K (VITAMIN K2-VITAMIN D3 PO) Take by mouth daily.   Yes [provider]  Magnesium Oxide -Mg Supplement 500 MG TABS Take 500 mg by mouth daily. Patient taking differently: Take 250 mg by mouth daily.    [provider]  tirzepatide  (ZEPBOUND ) 2.5 MG/0.5ML injection vial Inject 2.5 mg into the skin once a week. Patient not taking: Reported on 05/21/2024 04/02/24   Bair, Kalpana, MD    Allergies as of 04/11/2024 - Review Complete  03/29/2024  Allergen Reaction Noted   Ezetimibe  07/01/2020   Statins  07/02/2020   Sulfa antibiotics Itching and Rash 01/29/2015    Family History  Problem Relation Age of Onset   Hypertension Mother    Prostate cancer Neg Hx    Bladder Cancer Neg Hx    Testicular cancer Neg Hx    Kidney cancer Neg Hx     Social History   Socioeconomic History   Marital status: Married    Spouse name: Lorrene   Number of children: 1   Years of education: Not on file   Highest  education level: Not on file  Occupational History   Not on file  Tobacco Use   Smoking status: Former    Passive exposure: Past   Smokeless tobacco: Never  Vaping Use   Vaping status: Never Used  Substance and Sexual Activity   Alcohol use: No   Drug use: No   Sexual activity: Not Currently  Other Topics Concern   Not on file  Social History Narrative   Lives with spouse Lorrene   Social Drivers of Health   Financial Resource Strain: Low Risk  (05/21/2024)   Overall Financial Resource Strain (CARDIA)    Difficulty of Paying Living Expenses: Not hard at all  Food Insecurity: No Food Insecurity (05/21/2024)   Hunger Vital Sign    Worried About Running Out of Food in the Last Year: Never true    Ran Out of Food in the Last Year: Never true  Transportation Needs: No Transportation Needs (05/21/2024)   PRAPARE - Administrator, Civil Service (Medical): No    Lack of Transportation (Non-Medical): No  Physical Activity: Inactive (05/21/2024)   Exercise Vital Sign    Days of Exercise per Week: 0 days    Minutes of Exercise per Session: 0 min  Stress: No Stress Concern Present (05/21/2024)   [employer] of Occupational Health - Occupational Stress Questionnaire    Feeling of Stress: Not at all  Social Connections: Socially Integrated (05/21/2024)   Social Connection and Isolation Panel    Frequency of Communication with Friends and Family: More than three times a week    Frequency of Social Gatherings with Friends and Family: More than three times a week    Attends Religious Services: More than 4 times per year    Active Member of [employer] or Organizations: Yes    Attends Engineer, structural: More than 4 times per year    Marital Status: Married  Catering manager Violence: Not At Risk (05/21/2024)   Humiliation, Afraid, Rape, and Kick questionnaire    Fear of Current or Ex-Partner: No    Emotionally Abused: No    Physically Abused: No    Sexually Abused: No     Review of Systems: See HPI, otherwise negative ROS  Physical Exam: Pulse 82   Temp 98 F (36.7 C) (Temporal)   Resp 18   Ht 5' 5 (1.651 m)   Wt 95.3 kg   BMI 34.95 kg/m  General:   Alert,  pleasant and cooperative in NAD Head:  Normocephalic and atraumatic. Lungs:  Clear to auscultation.    Heart:  Regular rate and rhythm.   Impression/Plan: Joy Vaughan is here for ophthalmic surgery.  Risks, benefits, limitations, and alternatives regarding ophthalmic surgery have been reviewed with the patient.  Questions have been answered.  All parties agreeable.   [name], MD  05/30/2024, 9:24 AM

## 2024-05-30 NOTE — Op Note (Signed)
 OPERATIVE NOTE  Jalina Blowers [id number] 05/30/2024   PREOPERATIVE DIAGNOSIS:  Nuclear sclerotic cataract left eye. H25.12   POSTOPERATIVE DIAGNOSIS:    Nuclear sclerotic cataract left eye.     PROCEDURE:  Phacoemusification with posterior chamber intraocular lens placement of the left eye  Ultrasound time: Procedure(s): PHACOEMULSIFICATION, CATARACT, WITH IOL  5.26 00:28.2 (Left)  LENS:   Implant Name Type Inv. Item Serial No. Manufacturer Lot No. LRB No. Used Action  LENS IOL TECNIS EYHANCE 22.0 - [id number] Intraocular Lens LENS IOL TECNIS EYHANCE 22.0 [id number] SIGHTPATH  Left 1 Implanted      SURGEON:  Dene FABIENE Etienne, MD   ANESTHESIA:  Topical with tetracaine drops and 2% Xylocaine  jelly, augmented with 1% preservative-free intracameral lidocaine .    COMPLICATIONS:  None.   DESCRIPTION OF PROCEDURE:  The patient was identified in the holding room and transported to the operating room and placed in the supine position under the operating microscope.  The left eye was identified as the operative eye and it was prepped and draped in the usual sterile ophthalmic fashion.   A 1 millimeter clear-corneal paracentesis was made at the 1:30 position.  0.5 ml of preservative-free 1% lidocaine  was injected into the anterior chamber.  The anterior chamber was filled with Viscoat viscoelastic.  A 2.4 millimeter keratome was used to make a near-clear corneal incision at the 10:30 position.  .  A curvilinear capsulorrhexis was made with a cystotome and capsulorrhexis forceps.  Balanced salt solution was used to hydrodissect and hydrodelineate the nucleus.   Phacoemulsification was then used in stop and chop fashion to remove the lens nucleus and epinucleus.  The remaining cortex was then removed using the irrigation and aspiration handpiece. Provisc was then placed into the capsular bag to distend it for lens placement.  A lens was then injected into the capsular bag.  The remaining  viscoelastic was aspirated.   Wounds were hydrated with balanced salt solution.  The anterior chamber was inflated to a physiologic pressure with balanced salt solution.  No wound leaks were noted. Cefuroxime 0.1 ml of a 10mg /ml solution was injected into the anterior chamber for a dose of 1 mg of intracameral antibiotic at the completion of the case.   Timolol and Brimonidine drops were applied to the eye.  The patient was taken to the recovery room in stable condition without complications of anesthesia or surgery.  Wana Mount 05/30/2024, 10:37 AM

## 2024-06-04 ENCOUNTER — Ambulatory Visit

## 2024-06-11 ENCOUNTER — Ambulatory Visit

## 2024-06-11 DIAGNOSIS — E538 Deficiency of other specified B group vitamins: Secondary | ICD-10-CM

## 2024-06-11 MED ORDER — CYANOCOBALAMIN 1000 MCG/ML IJ SOLN
1000.0000 ug | Freq: Once | INTRAMUSCULAR | Status: AC
  Administered 2024-06-11: 1000 ug via INTRAMUSCULAR

## 2024-06-11 NOTE — Progress Notes (Signed)
 Pt presented for their vitamin B12 injection. Pt was identified through two identifiers. Pt tolerated shot well in their left deltoid.

## 2024-06-21 ENCOUNTER — Telehealth: Payer: Self-pay

## 2024-06-21 NOTE — Telephone Encounter (Signed)
 Copied from CRM [id number]. Topic: Appointments - Scheduling Inquiry for Clinic >> Jun 21, 2024  3:02 PM Joy Vaughan wrote: Reason for CRM: Patient requests a return call ,pt needs to know when to make an medication appnt for Zepbound ? Before or after she starts taking it,

## 2024-06-21 NOTE — Telephone Encounter (Signed)
 Patient notified and made aware of Dr Graylon recommendations. Patient is scheduled for 07/23/24 at 3:00 pm with provider.

## 2024-06-21 NOTE — Telephone Encounter (Signed)
 If she started Zepbound , recommend follow up in 4-6 weeks after starting Zepbound .   Thank you,  Luke Shade, MD

## 2024-07-16 ENCOUNTER — Ambulatory Visit

## 2024-07-16 DIAGNOSIS — E538 Deficiency of other specified B group vitamins: Secondary | ICD-10-CM | POA: Diagnosis not present

## 2024-07-16 MED ORDER — CYANOCOBALAMIN 1000 MCG/ML IJ SOLN
1000.0000 ug | Freq: Once | INTRAMUSCULAR | Status: AC
  Administered 2024-07-16: 1000 ug via INTRAMUSCULAR

## 2024-07-16 NOTE — Progress Notes (Signed)
 Pt presented for their vitamin B12 injection. Pt was identified through two identifiers. Pt tolerated shot well in their left anterior thigh per her request

## 2024-07-23 ENCOUNTER — Ambulatory Visit

## 2024-07-23 ENCOUNTER — Telehealth: Payer: Self-pay

## 2024-07-23 NOTE — Telephone Encounter (Signed)
 Last written by Dr Abbey on 04/02/24 dose 2.5 mg with 1 refill.

## 2024-07-23 NOTE — Telephone Encounter (Signed)
 Can we please call the pharmacy to confirm what strength of medication patient is taking, when she last refilled and who was the last prescriber prescribing Tirzepatide . This refill can wait till her appointment with me on 07/24/24.   Thank you,  Luke Shade, MD

## 2024-07-23 NOTE — Telephone Encounter (Signed)
 Prescription Request  07/23/2024  LOV: 03/29/2024  What is the name of the medication or equipment? tirzepatide  (ZEPBOUND ) 2.5 MG/0.5ML injection via   Have you contacted your pharmacy to request a refill? No   Which pharmacy would you like this sent to?  [id number] [place], [place] - [address] AT [facility] OF [address] [place] [address] Phone: [phone] Fax: [phone]   Pt asking for this medication by wednesday so she would not go without it for a few days because the pharmacy takes up to 3 days to get the medication in.   Patient notified that their request is being sent to the clinical staff for review and that they should receive a response within 2 business days.  Pt is really wanting this Rx to be sent today.  Please advise at Mobile [phone] (mobile)

## 2024-07-24 ENCOUNTER — Ambulatory Visit (INDEPENDENT_AMBULATORY_CARE_PROVIDER_SITE_OTHER)

## 2024-07-24 VITALS — BP 110/80 | HR 89 | Temp 98.5°F | Ht 65.0 in | Wt 200.5 lb

## 2024-07-24 DIAGNOSIS — F39 Unspecified mood [affective] disorder: Secondary | ICD-10-CM

## 2024-07-24 DIAGNOSIS — E66811 Obesity, class 1: Secondary | ICD-10-CM

## 2024-07-24 DIAGNOSIS — Z6833 Body mass index (BMI) 33.0-33.9, adult: Secondary | ICD-10-CM

## 2024-07-24 DIAGNOSIS — E661 Drug-induced obesity: Secondary | ICD-10-CM

## 2024-07-24 MED ORDER — TIRZEPATIDE-WEIGHT MANAGEMENT 5 MG/0.5ML ~~LOC~~ SOLN: 5.0000 mg | SUBCUTANEOUS | 0 refills | Status: DC

## 2024-07-24 NOTE — Assessment & Plan Note (Addendum)
 Weight loss achieved with Zepbound , no significant side effects except occasional constipation.  Increased Zepbound  to 5 mg once weekly. Sent prescription to [facility] with a 90-day refill. Scheduled follow-up in 6-8 weeks, virtual visit acceptable. Advised to monitor for gastrointestinal side effects and report if she occurs. Encouraged continuation of healthy diet and exercise as tolerated. Orders:   tirzepatide  5 MG/0.5ML injection vial; Inject 5 mg into the skin once a week.

## 2024-07-24 NOTE — Assessment & Plan Note (Addendum)
 Continue Paroxetine  20 mg daily, mood has been normal.

## 2024-07-24 NOTE — Progress Notes (Signed)
 Established Patient Office Visit   Subjective  Patient ID: Joy Vaughan, female    DOB: [birth year]  Age: 73 y.o. MRN: [id number]  Chief Complaint  Patient presents with   Weight Check   HPI:   Joy Vaughan is a 73 year old female who presents with weight management.  She has been on She has been on Zepbound  2.5 mg weekly for one month, starting at a weight of 213.8 pounds and currently weighing 200.5 pounds. She experiences occasional constipation, which she manages with Miralax . She obtains the medication from [facility] and is due for her next dose soon.   For physical activity, she uses a walker to navigate her home, which helps stabilize her legs and allows her to maintain some level of mobility. No nausea, stomach pain, bloating, chest pain, or palpitations.   ROS As per HPI    Objective:     BP 110/80 (BP Location: Left Arm, Patient Position: Sitting, Cuff Size: Normal)   Pulse 89   Temp 98.5 F (36.9 C) (Oral)   Ht 5' 5 (1.651 m)   Wt 200 lb 8 oz (90.9 kg)   SpO2 94%   BMI 33.36 kg/m      07/24/2024    4:29 PM 05/21/2024    1:18 PM 03/29/2024   11:10 AM  Depression screen PHQ 2/9  Decreased Interest 0 0 2  Down, Depressed, Hopeless 0 0 0  PHQ - 2 Score 0 0 2  Altered sleeping 1 3 3   Tired, decreased energy 1 0 2  Change in appetite 0 3 3  Feeling bad or failure about yourself  0 0 0  Trouble concentrating 0 0 1  Moving slowly or fidgety/restless 0 0 3  Suicidal thoughts 0 0 0  PHQ-9 Score 2 6  14    Difficult doing work/chores Not difficult at all Somewhat difficult Somewhat difficult     Data saved with a previous flowsheet row definition      07/24/2024    4:29 PM 03/29/2024   11:10 AM 09/28/2023    1:17 PM 03/28/2023    2:21 PM  GAD 7 : Generalized Anxiety Score  Nervous, Anxious, on Edge 0 0 0 0  Control/stop worrying 0 0 0 0  Worry too much - different things 0 0 0 0  Trouble relaxing 0 0 0 0  Restless 0 0 0 0  Easily annoyed or irritable  0 0 0 0  Afraid - awful might happen 0 0 0 0  Total GAD 7 Score 0 0 0 0  Anxiety Difficulty Not difficult at all Not difficult at all Not difficult at all Not difficult at all      07/24/2024    4:29 PM 05/21/2024    1:18 PM 03/29/2024   11:10 AM  Depression screen PHQ 2/9  Decreased Interest 0 0 2  Down, Depressed, Hopeless 0 0 0  PHQ - 2 Score 0 0 2  Altered sleeping 1 3 3   Tired, decreased energy 1 0 2  Change in appetite 0 3 3  Feeling bad or failure about yourself  0 0 0  Trouble concentrating 0 0 1  Moving slowly or fidgety/restless 0 0 3  Suicidal thoughts 0 0 0  PHQ-9 Score 2 6  14    Difficult doing work/chores Not difficult at all Somewhat difficult Somewhat difficult     Data saved with a previous flowsheet row definition      07/24/2024  4:29 PM 03/29/2024   11:10 AM 09/28/2023    1:17 PM 03/28/2023    2:21 PM  GAD 7 : Generalized Anxiety Score  Nervous, Anxious, on Edge 0 0 0 0  Control/stop worrying 0 0 0 0  Worry too much - different things 0 0 0 0  Trouble relaxing 0 0 0 0  Restless 0 0 0 0  Easily annoyed or irritable 0 0 0 0  Afraid - awful might happen 0 0 0 0  Total GAD 7 Score 0 0 0 0  Anxiety Difficulty Not difficult at all Not difficult at all Not difficult at all Not difficult at all   SDOH Screenings   Food Insecurity: No Food Insecurity (05/21/2024)  Housing: Unknown (05/21/2024)  Transportation Needs: No Transportation Needs (05/21/2024)  Utilities: Not At Risk (05/21/2024)  Alcohol Screen: Low Risk  (05/21/2024)  Depression (PHQ2-9): Low Risk  (07/24/2024)  Recent Concern: Depression (PHQ2-9) - Medium Risk (05/21/2024)  Financial Resource Strain: Low Risk  (05/21/2024)  Physical Activity: Inactive (05/21/2024)  Social Connections: Socially Integrated (05/21/2024)  Stress: No Stress Concern Present (05/21/2024)  Tobacco Use: Medium Risk (07/24/2024)  Health Literacy: Adequate Health Literacy (05/21/2024)     Physical Exam Constitutional:       General: She is not in acute distress.    Appearance: She is obese.  HENT:     Head: Normocephalic and atraumatic.  Cardiovascular:     Rate and Rhythm: Normal rate.  Pulmonary:     Effort: Pulmonary effort is normal.     Breath sounds: Normal breath sounds.  Abdominal:     Palpations: Abdomen is soft.     Tenderness: There is no abdominal tenderness.  Musculoskeletal:     Cervical back: Neck supple.     Right lower leg: No edema.     Left lower leg: No edema.  Skin:    General: Skin is warm.  Neurological:     Mental Status: She is alert and oriented to person, place, and time.  Psychiatric:        Mood and Affect: Mood normal.        Behavior: Behavior is cooperative.        No results found for any visits on 07/24/24.  The ASCVD Risk score (Arnett DK, et al., 2019) failed to calculate for the following reasons:   Cannot find a previous HDL lab   Cannot find a previous total cholesterol lab     Assessment & Plan:  Patient is a pleasant 73 year old female presenting for weight management. Recommend follow up with [facility] orthopedic department for back pain, b/l knee pain.  Assessment & Plan Class 1 drug-induced obesity with serious comorbidity and body mass index (BMI) of 33.0 to 33.9 in adult Weight loss achieved with Zepbound , no significant side effects except occasional constipation.  Increased Zepbound  to 5 mg once weekly. Sent prescription to [facility] with a 90-day refill. Scheduled follow-up in 6-8 weeks, virtual visit acceptable. Advised to monitor for gastrointestinal side effects and report if she occurs. Encouraged continuation of healthy diet and exercise as tolerated. Orders:   tirzepatide  5 MG/0.5ML injection vial; Inject 5 mg into the skin once a week.  Mood disorder Continue Paroxetine  20 mg daily, mood has been normal.        Return in about 7 weeks (around 09/11/2024) for 6-7 weeks f/u on Zepbound , virtual visit is okay.   Luke Shade,  MD

## 2024-07-24 NOTE — Patient Instructions (Signed)
 Follow up with Dr. Abbey in about 6-7 weeks, virtual visit is okay

## 2024-07-24 NOTE — Telephone Encounter (Signed)
 Will discuss this during her appointment with me on 07/24/24.   Luke Shade, MD

## 2024-07-25 ENCOUNTER — Telehealth: Payer: Self-pay

## 2024-07-25 NOTE — Telephone Encounter (Signed)
 Received a call from [facility] stating patient was at the pharmacy and was pharmacy technician was being rude to her stating they did not receive her Zepbound  prescription. [facility] pharmacy and was inform the patient was in their facility being rude telling their staff to shut up. Spoke with pharmacy to provide a verbal order. Verbalized understanding and has no further questions at this time. Patient was notified and verbalized understanding.

## 2024-07-25 NOTE — Telephone Encounter (Unsigned)
 Copied from CRM #[id number]. Topic: Clinical - Prescription Issue >> Jul 25, 2024 10:06 AM Carlyon D wrote: Reason for CRM:Pt is calling stating pharmacy does not have her tirzepitide 5 mg. I was on th eline with pt while she was in the pharmacy asking questions about the medication miss ashley the pharmacy manager was absolutely nasty with the pt yelling at her and screaming that she explained it to her already and they dont have the medication.  Pt let ashley  no it was confirmed by pharmacy yesterday miss ashley then stated she that dont mean crap to her. Pt got treated very nast by miss ashley the manufacturing systems engineer. Called CAL miss Lailana Shira stated she was going to call pharmacy and please call pt when script issue is fixed.

## 2024-07-25 NOTE — Telephone Encounter (Signed)
 Talked with the patient on the phone. Patient has prescription of Tirzepatide  5 mg once weekly, pharmacy confirmed that with her. No other questions.   Luke Shade, MD

## 2024-08-20 ENCOUNTER — Ambulatory Visit (INDEPENDENT_AMBULATORY_CARE_PROVIDER_SITE_OTHER)

## 2024-08-20 ENCOUNTER — Telehealth: Payer: Self-pay

## 2024-08-20 DIAGNOSIS — E538 Deficiency of other specified B group vitamins: Secondary | ICD-10-CM | POA: Diagnosis not present

## 2024-08-20 MED ORDER — CYANOCOBALAMIN 1000 MCG/ML IJ SOLN
1000.0000 ug | Freq: Once | INTRAMUSCULAR | Status: AC
  Administered 2024-08-20: 1000 ug via INTRAMUSCULAR

## 2024-08-20 NOTE — Telephone Encounter (Signed)
 Copied from CRM [id number]. Topic: General - Other >> Aug 20, 2024 12:16 PM Zebedee SAUNDERS wrote: Reason for CRM: Pt stated she can't reschedule for 09/05/2024 4:30 p.m., please call pt at  [phone].

## 2024-08-20 NOTE — Progress Notes (Signed)
 Pt presented for their vitamin B12 injection. Pt was identified through two identifiers. Pt tolerated shot well in their left deltoid.

## 2024-09-05 ENCOUNTER — Ambulatory Visit

## 2024-09-06 ENCOUNTER — Ambulatory Visit (INDEPENDENT_AMBULATORY_CARE_PROVIDER_SITE_OTHER)

## 2024-09-06 VITALS — BP 130/80 | HR 83 | Temp 97.8°F | Ht 65.0 in | Wt 188.0 lb

## 2024-09-06 DIAGNOSIS — F5101 Primary insomnia: Secondary | ICD-10-CM

## 2024-09-06 DIAGNOSIS — Z95811 Presence of heart assist device: Secondary | ICD-10-CM

## 2024-09-06 DIAGNOSIS — E538 Deficiency of other specified B group vitamins: Secondary | ICD-10-CM | POA: Diagnosis not present

## 2024-09-06 DIAGNOSIS — R7303 Prediabetes: Secondary | ICD-10-CM | POA: Diagnosis not present

## 2024-09-06 DIAGNOSIS — I495 Sick sinus syndrome: Secondary | ICD-10-CM

## 2024-09-06 DIAGNOSIS — G4733 Obstructive sleep apnea (adult) (pediatric): Secondary | ICD-10-CM | POA: Diagnosis not present

## 2024-09-06 DIAGNOSIS — I10 Essential (primary) hypertension: Secondary | ICD-10-CM | POA: Diagnosis not present

## 2024-09-06 DIAGNOSIS — E669 Obesity, unspecified: Secondary | ICD-10-CM | POA: Diagnosis not present

## 2024-09-06 MED ORDER — ZEPBOUND 7.5 MG/0.5ML ~~LOC~~ SOAJ: 7.5000 mg | SUBCUTANEOUS | 1 refills | Status: AC

## 2024-09-06 MED ORDER — TIRZEPATIDE-WEIGHT MANAGEMENT 10 MG/0.5ML ~~LOC~~ SOLN: 10.0000 mg | SUBCUTANEOUS | 1 refills | Status: AC

## 2024-09-06 MED ORDER — LOSARTAN POTASSIUM-HCTZ 100-25 MG PO TABS: 1.0000 | ORAL_TABLET | Freq: Every day | ORAL | 1 refills | Status: AC

## 2024-09-06 NOTE — Assessment & Plan Note (Addendum)
 And history of prediabetes, check A1c today. Orders:   HgB A1c

## 2024-09-06 NOTE — Assessment & Plan Note (Addendum)
 History of B12 deficiency with ongoing monthly intramuscular B12 injection.  Check B12 today. Orders:   B12

## 2024-09-06 NOTE — Assessment & Plan Note (Addendum)
 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  Following with [facility] cardiology.  No new concern.

## 2024-09-06 NOTE — Progress Notes (Signed)
 "  Established Patient Office Visit   Subjective  Patient ID: Joy Vaughan, female    DOB: 1951-06-15  Age: 74 y.o. MRN: [id number]  Chief Complaint  Patient presents with   Weight Check    Discussed the use of AI scribe software for clinical note transcription with the patient, who gave verbal consent to proceed.  History of Present Illness Joy Vaughan is a 74 year old female who presents for medication management and follow-up on weight loss.  She has experienced significant weight loss, decreasing from 213 pounds to 188 pounds, which she attributes to her current medication regimen of Zepbound  5 mg weekly. She is actively engaged in healthy eating and walking as part of her weight management plan, with a goal to reach a weight of 125-130 pounds.  Her blood pressure management is usually normal at home, and she is currently taking losartan  hydrochlorothiazide  daily. She is also taking 250 mg of magnesium.  She reports difficulty sleeping and previously used Ambien , which is no longer prescribed. She now uses melatonin gummies, taking two at night, which helps her sleep every other night. She experiences trouble sleeping through the night and wakes up to use the bathroom.  She has a history of cataract surgery and is no longer using eye drops related to that procedure. She is taking B12 supplements and had her last B12 injection a couple of weeks ago, with a repeat B12 level check due soon.  No nausea or vomiting. No side effects from her current medication regimen.    ROS As per HPI    Objective:     BP 130/80   Pulse 83   Temp 97.8 F (36.6 C) (Oral)   Ht 5' 5 (1.651 m)   Wt 188 lb (85.3 kg)   SpO2 93%   BMI 31.28 kg/m      09/06/2024    2:31 PM 07/24/2024    4:29 PM 05/21/2024    1:18 PM  Depression screen PHQ 2/9  Decreased Interest 0 0 0  Down, Depressed, Hopeless 0 0 0  PHQ - 2 Score 0 0 0  Altered sleeping 3 1 3   Tired, decreased energy 0 1 0   Change in appetite 0 0 3  Feeling bad or failure about yourself  0 0 0  Trouble concentrating 0 0 0  Moving slowly or fidgety/restless 0 0 0  Suicidal thoughts 0 0 0  PHQ-9 Score 3 2 6    Difficult doing work/chores Not difficult at all Not difficult at all Somewhat difficult     Data saved with a previous flowsheet row definition      09/06/2024    2:31 PM 07/24/2024    4:29 PM 03/29/2024   11:10 AM 09/28/2023    1:17 PM  GAD 7 : Generalized Anxiety Score  Nervous, Anxious, on Edge 0 0  0  0   Control/stop worrying 0 0  0  0   Worry too much - different things 0 0  0  0   Trouble relaxing 0 0  0  0   Restless 0 0  0  0   Easily annoyed or irritable 0 0  0  0   Afraid - awful might happen 0 0  0  0   Total GAD 7 Score 0 0 0 0  Anxiety Difficulty Not difficult at all Not difficult at all Not difficult at all Not difficult at all     Data saved with  a previous flowsheet row definition      09/06/2024    2:31 PM 07/24/2024    4:29 PM 05/21/2024    1:18 PM  Depression screen PHQ 2/9  Decreased Interest 0 0 0  Down, Depressed, Hopeless 0 0 0  PHQ - 2 Score 0 0 0  Altered sleeping 3 1 3   Tired, decreased energy 0 1 0  Change in appetite 0 0 3  Feeling bad or failure about yourself  0 0 0  Trouble concentrating 0 0 0  Moving slowly or fidgety/restless 0 0 0  Suicidal thoughts 0 0 0  PHQ-9 Score 3 2 6    Difficult doing work/chores Not difficult at all Not difficult at all Somewhat difficult     Data saved with a previous flowsheet row definition      09/06/2024    2:31 PM 07/24/2024    4:29 PM 03/29/2024   11:10 AM 09/28/2023    1:17 PM  GAD 7 : Generalized Anxiety Score  Nervous, Anxious, on Edge 0 0  0  0   Control/stop worrying 0 0  0  0   Worry too much - different things 0 0  0  0   Trouble relaxing 0 0  0  0   Restless 0 0  0  0   Easily annoyed or irritable 0 0  0  0   Afraid - awful might happen 0 0  0  0   Total GAD 7 Score 0 0 0 0  Anxiety Difficulty Not  difficult at all Not difficult at all Not difficult at all Not difficult at all     Data saved with a previous flowsheet row definition   SDOH Screenings   Food Insecurity: No Food Insecurity (05/21/2024)  Housing: Unknown (05/21/2024)  Transportation Needs: No Transportation Needs (05/21/2024)  Utilities: Not At Risk (05/21/2024)  Alcohol Screen: Low Risk (05/21/2024)  Depression (PHQ2-9): Low Risk (09/06/2024)  Financial Resource Strain: Low Risk (05/21/2024)  Physical Activity: Inactive (05/21/2024)  Social Connections: Socially Integrated (05/21/2024)  Stress: No Stress Concern Present (05/21/2024)  Tobacco Use: Medium Risk (09/06/2024)  Health Literacy: Adequate Health Literacy (05/21/2024)     Physical Exam Constitutional:      General: She is not in acute distress.    Appearance: Normal appearance. She is obese.  HENT:     Head: Normocephalic and atraumatic.     Right Ear: Tympanic membrane normal. There is no impacted cerumen.     Left Ear: Tympanic membrane normal. There is no impacted cerumen.     Mouth/Throat:     Mouth: Mucous membranes are moist.  Neck:     Thyroid : No thyroid  mass or thyroid  tenderness.  Cardiovascular:     Rate and Rhythm: Normal rate and regular rhythm.  Pulmonary:     Effort: Pulmonary effort is normal.     Breath sounds: Normal breath sounds. No wheezing.  Abdominal:     General: Bowel sounds are normal.     Palpations: Abdomen is soft.     Tenderness: There is no abdominal tenderness. There is no guarding.  Musculoskeletal:     Cervical back: Neck supple. No rigidity.     Right lower leg: No edema.     Left lower leg: No edema.  Skin:    General: Skin is warm.  Neurological:     Mental Status: She is alert and oriented to person, place, and time.  Psychiatric:  Mood and Affect: Mood normal.        Behavior: Behavior normal.        No results found for any visits on 09/06/24.  The ASCVD Risk score (Arnett DK, et al., 2019)  failed to calculate for the following reasons:   Cannot find a previous HDL lab   Cannot find a previous total cholesterol lab     Assessment & Plan:   Assessment & Plan Obesity (BMI 30-39.9) Class 1 obesity with serious comorbidity: Comorbidities including hypertension, hyperlipidemia (not on statin due to patient preference), hypertension, OSA (intolerant to CPAP) No side effects from Zepbound .  Currently on 5 mg weekly injection on Thursdays.   Has noted significant weight loss and is happy with the result.  Goal weight 135 pounds.   - Discussed cost savings and monitoring for side effects. - Increased Zepbound  to 7.5 mg weekly for one month, then to 10 mg weekly. - Sent 7.5 mg prescription to local pharmacy with one refill. - Sent 10 mg prescription to [facility] for potential cost savings. - Consulted clinical pharmacist to help patient assist with questions related to Zepbound  vials. - Scheduled follow-up in three months to assess progress and tolerance. 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  Following with [facility] cardiology.  No new concern.    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  Following with [facility] cardiology.  No new concern.    Obstructive sleep apnea syndrome Not on CPAP therapy. Did not like using CPAP mask. Discussed risk of atrial fibrillation, uncontrolled hypertension among other risks related to untreated OSA. Patient verbalizes understanding and does not want to further discuss on OSA, sleep specialist referral at this time.      Primary hypertension Blood pressure on arrival slightly above goal.  Repeat blood pressure within goal.  Continue losartan -hydrochlorothiazide  100-25 mg daily.  Will check CMP today. Orders:   losartan -hydrochlorothiazide  (HYZAAR) 100-25 MG tablet; Take 1 tablet by mouth daily.   Comp Met  (CMET)  Prediabetes And history of prediabetes, check A1c today. Orders:   HgB A1c  B12 deficiency History of B12 deficiency with ongoing monthly intramuscular B12 injection.  Check B12 today. Orders:   B12  Primary insomnia Previously treated with Ambien .  Takes melatonin as needed with some improvement.  Continue as needed melatonin and discussed sleep hygiene during today's visit.      Return in about 3 months (around 12/05/2024) for Weigh f/u, zepbound  dose adjustment .   Luke Shade, MD "

## 2024-09-06 NOTE — Assessment & Plan Note (Signed)
 Class 1 obesity with serious comorbidity: Comorbidities including hypertension, hyperlipidemia (not on statin due to patient preference), hypertension, OSA (intolerant to CPAP) No side effects from Zepbound .  Currently on 5 mg weekly injection on Thursdays.   Has noted significant weight loss and is happy with the result.  Goal weight 135 pounds.   - Discussed cost savings and monitoring for side effects. - Increased Zepbound  to 7.5 mg weekly for one month, then to 10 mg weekly. - Sent 7.5 mg prescription to local pharmacy with one refill. - Sent 10 mg prescription to [facility] for potential cost savings. - Consulted clinical pharmacist to help patient assist with questions related to Zepbound  vials. - Scheduled follow-up in three months to assess progress and tolerance. Orders:   AMB Referral VBCI Care Management

## 2024-09-06 NOTE — Assessment & Plan Note (Signed)
 Previously treated with Ambien .  Takes melatonin as needed with some improvement.  Continue as needed melatonin and discussed sleep hygiene during today's visit.

## 2024-09-06 NOTE — Patient Instructions (Signed)
 Recommendation Details  Regular bedtime and rise time Having a consistent bedtime and rise time leads to more regular sleep schedules and avoids periods of sleep deprivation or periods of extended wakefulness during the night.  Avoid napping Avoid napping, especially naps lasting longer than 1 hour and naps late in the day.  Limit caffeine Avoid caffeine after lunch. The time between lunch and bedtime represents approximately 2 half-lives for caffeine, and this time window allows for most caffeine to be metabolized before bedtime.  Limit alcohol Recommendations are typically focused on avoiding alcohol near bedtime. Alcohol is initially sedating, but activating as it is metabolized. Alcohol also negatively impacts sleep architecture.  Avoid nicotine Nicotine is a stimulant and should be avoided near bedtime and at night.  Exercise Daytime physical activity is encouraged, in particular, 4 to 6 hours before bedtime, as this may facilitate sleep onset. Rigorous exercise within 2 hours of bedtime is discouraged.  Keep the sleep environment quiet and dark Noise and light exposure during the night can disrupt sleep. White noise or ear plugs are often recommended to reduce disturbing noise. Using blackout shades or an eye mask is commonly recommended to reduce light. This may also include avoiding exposure to television or technology near bedtime, as this can have an impact on circadian rhythms by shifting sleep timing later.  Bedroom clock Avoid checking the time at night. This includes alarm clocks and other time pieces (eg, watches and smart phones). Checking the time increases cognitive arousal and prolongs wakefulness.  Evening eating Avoid a large meal close to bedtime. Eat a healthy and filling (but not too heavy) meal in the early evening and avoid late-night snacks.   Increase Zepbound  from 5 mg weekly to 7.5 mg for one month then 10 mg weekly till your next visit with me in 3 months.

## 2024-09-06 NOTE — Assessment & Plan Note (Addendum)
 Blood pressure on arrival slightly above goal.  Repeat blood pressure within goal.  Continue losartan -hydrochlorothiazide  100-25 mg daily.  Will check CMP today. Orders:   losartan -hydrochlorothiazide  (HYZAAR) 100-25 MG tablet; Take 1 tablet by mouth daily.   Comp Met (CMET)

## 2024-09-06 NOTE — Assessment & Plan Note (Addendum)
 Not on CPAP therapy. Did not like using CPAP mask. Discussed risk of atrial fibrillation, uncontrolled hypertension among other risks related to untreated OSA. Patient verbalizes understanding and does not want to further discuss on OSA, sleep specialist referral at this time.

## 2024-09-06 NOTE — Assessment & Plan Note (Addendum)
 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  Following with [facility] cardiology.  No new concern.

## 2024-09-07 LAB — COMPREHENSIVE METABOLIC PANEL WITH GFR
ALT: 20 U/L (ref 3–35)
AST: 16 U/L (ref 5–37)
Albumin: 4.7 g/dL (ref 3.5–5.2)
Alkaline Phosphatase: 67 U/L (ref 39–117)
BUN: 20 mg/dL (ref 6–23)
CO2: 27 meq/L (ref 19–32)
Calcium: 9.8 mg/dL (ref 8.4–10.5)
Chloride: 102 meq/L (ref 96–112)
Creatinine, Ser: 0.76 mg/dL (ref 0.40–1.20)
GFR: 77.07 mL/min
Glucose, Bld: 99 mg/dL (ref 70–99)
Potassium: 4.2 meq/L (ref 3.5–5.1)
Sodium: 139 meq/L (ref 135–145)
Total Bilirubin: 0.3 mg/dL (ref 0.2–1.2)
Total Protein: 7 g/dL (ref 6.0–8.3)

## 2024-09-07 LAB — VITAMIN B12: Vitamin B-12: 364 pg/mL (ref 211–911)

## 2024-09-07 LAB — HEMOGLOBIN A1C: Hgb A1c MFr Bld: 5.8 % (ref 4.6–6.5)

## 2024-09-10 ENCOUNTER — Ambulatory Visit: Payer: Self-pay

## 2024-09-11 ENCOUNTER — Telehealth: Payer: Self-pay

## 2024-09-11 NOTE — Progress Notes (Unsigned)
 Complex Care Management Note Care Guide Note  09/11/2024 Name: Joy Vaughan MRN: [id number] DOB: 1950/12/31   Complex Care Management Outreach Attempts: An unsuccessful telephone outreach was attempted today to offer the patient information about available complex care management services.  Follow Up Plan:  Additional outreach attempts will be made to offer the patient complex care management information and services.   Encounter Outcome:  No Answer  [facility]  [facility], [facility] VBCI Assistant Direct Dial: [phone]  Fax: [phone]

## 2024-09-12 ENCOUNTER — Ambulatory Visit

## 2024-09-13 NOTE — Progress Notes (Signed)
 Complex Care Management Note Care Guide Note  09/13/2024 Name: Joy Vaughan MRN: [id number] DOB: 1951/04/08   Complex Care Management Outreach Attempts: A second unsuccessful outreach was attempted today to offer the patient with information about available complex care management services.  Follow Up Plan:  Additional outreach attempts will be made to offer the patient complex care management information and services.   Encounter Outcome:  No Answer  Dreama [facility]  [facility], [facility] VBCI Assistant Direct Dial: [phone]  Fax: [phone]

## 2024-09-13 NOTE — Progress Notes (Signed)
 Complex Care Management Care Guide Note  09/13/2024 Name: Katrisha Segall MRN: [id number] DOB: May 02, 1951  [name] is a 74 y.o. year old female who is a primary care patient of Bair, Kalpana, MD. Returned call from patient [name] by phone today to assist with scheduling  with the Pharmacist.  Follow up plan: Patient states she was not aware of any PCP referral. Patient refused to verify per identity per HIPAA. Patient advised to follow up with PCP for additional concerns.  Referral will be closed due to (3) unsuccessful attempts to schedule per PCP request.  [facility]reama [facility]  [facility], [facility] VBCI Assistant Direct Dial: [phone]  Fax: [phone]

## 2024-09-20 ENCOUNTER — Ambulatory Visit

## 2024-09-24 ENCOUNTER — Ambulatory Visit

## 2024-10-01 ENCOUNTER — Ambulatory Visit

## 2024-10-03 ENCOUNTER — Ambulatory Visit

## 2025-05-27 ENCOUNTER — Ambulatory Visit
